# Patient Record
Sex: Female | Born: 1965 | Race: White | Hispanic: No | Smoking: Current every day smoker
Health system: Southern US, Community
[De-identification: ages and names within clinical notes are randomized; demographics above are authoritative.]

## PROBLEM LIST (undated history)

## (undated) DIAGNOSIS — I2510 Atherosclerotic heart disease of native coronary artery without angina pectoris: Secondary | ICD-10-CM

## (undated) DIAGNOSIS — F419 Anxiety disorder, unspecified: Secondary | ICD-10-CM

## (undated) DIAGNOSIS — I214 Non-ST elevation (NSTEMI) myocardial infarction: Secondary | ICD-10-CM

## (undated) DIAGNOSIS — E785 Hyperlipidemia, unspecified: Secondary | ICD-10-CM

## (undated) DIAGNOSIS — I739 Peripheral vascular disease, unspecified: Secondary | ICD-10-CM

## (undated) DIAGNOSIS — I70208 Unspecified atherosclerosis of native arteries of extremities, other extremity: Secondary | ICD-10-CM

## (undated) HISTORY — DX: Peripheral vascular disease, unspecified: I73.9

## (undated) HISTORY — DX: Non-ST elevation (NSTEMI) myocardial infarction: I21.4

## (undated) HISTORY — DX: Hyperlipidemia, unspecified: E78.5

## (undated) HISTORY — DX: Atherosclerotic heart disease of native coronary artery without angina pectoris: I25.10

## (undated) HISTORY — DX: Anxiety disorder, unspecified: F41.9

## (undated) HISTORY — PX: SP US TRANSCATHETER OCCLUSION: HXRAD92

## (undated) HISTORY — PX: PR VEIN BYPASS GRAFT,AORTO-FEM-POP: 35551

## (undated) HISTORY — DX: Unspecified atherosclerosis of native arteries of extremities, other extremity: I70.208

---

## 2006-04-16 DIAGNOSIS — I214 Non-ST elevation (NSTEMI) myocardial infarction: Secondary | ICD-10-CM

## 2006-04-16 HISTORY — DX: Non-ST elevation (NSTEMI) myocardial infarction: I21.4

## 2006-05-13 ENCOUNTER — Encounter (INDEPENDENT_AMBULATORY_CARE_PROVIDER_SITE_OTHER): Payer: Self-pay | Admitting: *Deleted

## 2006-05-13 ENCOUNTER — Ambulatory Visit: Payer: Self-pay | Admitting: Cardiology

## 2006-05-13 ENCOUNTER — Encounter: Payer: Self-pay | Admitting: Emergency Medicine

## 2006-05-14 ENCOUNTER — Encounter (INDEPENDENT_AMBULATORY_CARE_PROVIDER_SITE_OTHER): Payer: Self-pay | Admitting: *Deleted

## 2006-05-14 ENCOUNTER — Inpatient Hospital Stay (HOSPITAL_COMMUNITY): Admission: RE | Admit: 2006-05-14 | Discharge: 2006-05-16 | Payer: Self-pay | Admitting: Cardiology

## 2006-05-27 ENCOUNTER — Ambulatory Visit: Payer: Self-pay

## 2006-05-27 ENCOUNTER — Ambulatory Visit: Payer: Self-pay | Admitting: *Deleted

## 2006-05-27 ENCOUNTER — Inpatient Hospital Stay (HOSPITAL_COMMUNITY): Admission: AD | Admit: 2006-05-27 | Discharge: 2006-05-31 | Payer: Self-pay | Admitting: *Deleted

## 2006-06-03 ENCOUNTER — Ambulatory Visit: Payer: Self-pay | Admitting: Cardiovascular Disease

## 2006-06-11 ENCOUNTER — Ambulatory Visit: Payer: Self-pay | Admitting: Cardiology

## 2006-06-18 ENCOUNTER — Ambulatory Visit: Payer: Self-pay | Admitting: Cardiology

## 2006-06-25 ENCOUNTER — Ambulatory Visit: Payer: Self-pay | Admitting: Internal Medicine

## 2006-07-03 ENCOUNTER — Ambulatory Visit: Payer: Self-pay | Admitting: Internal Medicine

## 2006-07-13 ENCOUNTER — Ambulatory Visit: Payer: Self-pay | Admitting: Cardiology

## 2006-07-22 ENCOUNTER — Ambulatory Visit: Payer: Self-pay | Admitting: Cardiology

## 2006-08-06 ENCOUNTER — Ambulatory Visit: Payer: Self-pay | Admitting: Internal Medicine

## 2006-08-17 ENCOUNTER — Ambulatory Visit: Payer: Self-pay | Admitting: Cardiology

## 2006-08-18 ENCOUNTER — Ambulatory Visit: Payer: Self-pay | Admitting: Cardiovascular Disease

## 2006-08-18 ENCOUNTER — Ambulatory Visit (HOSPITAL_COMMUNITY): Admission: RE | Admit: 2006-08-18 | Discharge: 2006-08-18 | Payer: Self-pay | Admitting: Cardiology

## 2006-08-20 ENCOUNTER — Ambulatory Visit: Payer: Self-pay | Admitting: *Deleted

## 2006-10-19 ENCOUNTER — Ambulatory Visit: Payer: Self-pay | Admitting: Cardiology

## 2006-10-19 LAB — CONVERTED CEMR LAB
ALT: 13 units/L (ref 0–40)
AST: 15 units/L (ref 0–37)
Bilirubin, Direct: 0.1 mg/dL (ref 0.0–0.3)
Cholesterol: 139 mg/dL (ref 0–200)
Direct LDL: 96.7 mg/dL
HDL: 32 mg/dL — ABNORMAL LOW (ref >39.0)
LDL Cholesterol: 93 mg/dL (ref 0–99)
Total Protein: 7 g/dL (ref 6.0–8.3)
Triglycerides: 70 mg/dL (ref 0–149)

## 2006-10-29 ENCOUNTER — Ambulatory Visit: Payer: Self-pay | Admitting: *Deleted

## 2006-11-10 ENCOUNTER — Ambulatory Visit (HOSPITAL_COMMUNITY): Admission: RE | Admit: 2006-11-10 | Discharge: 2006-11-10 | Payer: Self-pay | Admitting: *Deleted

## 2006-11-10 ENCOUNTER — Ambulatory Visit: Payer: Self-pay | Admitting: *Deleted

## 2006-12-10 ENCOUNTER — Ambulatory Visit: Payer: Self-pay | Admitting: *Deleted

## 2006-12-11 ENCOUNTER — Ambulatory Visit: Payer: Self-pay | Admitting: *Deleted

## 2006-12-15 HISTORY — PX: FEMORAL BYPASS: SHX50

## 2006-12-28 ENCOUNTER — Ambulatory Visit: Payer: Self-pay | Admitting: Dentistry

## 2006-12-28 ENCOUNTER — Encounter: Payer: Self-pay | Admitting: Cardiology

## 2006-12-28 ENCOUNTER — Inpatient Hospital Stay (HOSPITAL_COMMUNITY): Admission: RE | Admit: 2006-12-28 | Discharge: 2007-01-02 | Payer: Self-pay | Admitting: *Deleted

## 2006-12-28 ENCOUNTER — Ambulatory Visit: Payer: Self-pay | Admitting: *Deleted

## 2006-12-29 ENCOUNTER — Encounter: Payer: Self-pay | Admitting: Cardiology

## 2007-01-12 ENCOUNTER — Ambulatory Visit: Payer: Self-pay | Admitting: Vascular Surgery

## 2007-01-19 ENCOUNTER — Ambulatory Visit: Payer: Self-pay | Admitting: Vascular Surgery

## 2007-01-28 ENCOUNTER — Ambulatory Visit: Payer: Self-pay | Admitting: *Deleted

## 2007-02-11 ENCOUNTER — Ambulatory Visit: Payer: Self-pay | Admitting: *Deleted

## 2007-02-19 ENCOUNTER — Ambulatory Visit: Payer: Self-pay | Admitting: *Deleted

## 2007-03-09 ENCOUNTER — Ambulatory Visit: Payer: Self-pay | Admitting: Cardiology

## 2007-03-11 ENCOUNTER — Ambulatory Visit: Payer: Self-pay | Admitting: *Deleted

## 2007-04-07 ENCOUNTER — Ambulatory Visit: Payer: Self-pay | Admitting: *Deleted

## 2007-04-08 ENCOUNTER — Ambulatory Visit: Payer: Self-pay | Admitting: *Deleted

## 2007-04-12 ENCOUNTER — Ambulatory Visit: Payer: Self-pay | Admitting: Surgery

## 2007-04-12 ENCOUNTER — Ambulatory Visit: Payer: Self-pay | Admitting: Cardiology

## 2007-04-12 ENCOUNTER — Encounter: Payer: Self-pay | Admitting: Physician Assistant

## 2007-04-14 ENCOUNTER — Encounter: Payer: Self-pay | Admitting: Cardiology

## 2007-04-14 ENCOUNTER — Ambulatory Visit: Payer: Self-pay | Admitting: Cardiology

## 2007-04-15 ENCOUNTER — Ambulatory Visit: Payer: Self-pay | Admitting: *Deleted

## 2007-04-20 ENCOUNTER — Ambulatory Visit: Payer: Self-pay | Admitting: Cardiology

## 2007-05-19 ENCOUNTER — Ambulatory Visit: Payer: Self-pay | Admitting: *Deleted

## 2007-05-19 ENCOUNTER — Ambulatory Visit (HOSPITAL_COMMUNITY): Admission: RE | Admit: 2007-05-19 | Discharge: 2007-05-19 | Payer: Self-pay | Admitting: *Deleted

## 2007-06-24 ENCOUNTER — Ambulatory Visit: Payer: Self-pay | Admitting: *Deleted

## 2007-07-16 ENCOUNTER — Ambulatory Visit: Payer: Self-pay | Admitting: Cardiology

## 2007-07-23 ENCOUNTER — Encounter: Payer: Self-pay | Admitting: Cardiology

## 2007-08-10 ENCOUNTER — Encounter: Payer: Self-pay | Admitting: Cardiology

## 2008-02-10 ENCOUNTER — Ambulatory Visit: Payer: Self-pay | Admitting: *Deleted

## 2008-02-16 ENCOUNTER — Ambulatory Visit: Payer: Self-pay | Admitting: Cardiology

## 2008-02-16 ENCOUNTER — Encounter: Payer: Self-pay | Admitting: Physician Assistant

## 2008-03-13 ENCOUNTER — Ambulatory Visit: Payer: Self-pay | Admitting: *Deleted

## 2008-05-01 ENCOUNTER — Encounter: Payer: Self-pay | Admitting: Cardiology

## 2008-05-01 ENCOUNTER — Ambulatory Visit: Payer: Self-pay | Admitting: Cardiology

## 2008-05-01 DIAGNOSIS — I7389 Other specified peripheral vascular diseases: Secondary | ICD-10-CM

## 2008-05-01 DIAGNOSIS — F17200 Nicotine dependence, unspecified, uncomplicated: Secondary | ICD-10-CM | POA: Insufficient documentation

## 2008-05-01 DIAGNOSIS — F323 Major depressive disorder, single episode, severe with psychotic features: Secondary | ICD-10-CM | POA: Insufficient documentation

## 2008-05-01 DIAGNOSIS — I2510 Atherosclerotic heart disease of native coronary artery without angina pectoris: Secondary | ICD-10-CM

## 2008-06-29 ENCOUNTER — Ambulatory Visit: Payer: Self-pay | Admitting: Cardiovascular Disease

## 2008-07-07 ENCOUNTER — Encounter: Payer: Self-pay | Admitting: Cardiology

## 2008-07-27 ENCOUNTER — Ambulatory Visit: Payer: Self-pay | Admitting: *Deleted

## 2008-10-30 ENCOUNTER — Ambulatory Visit: Payer: Self-pay | Admitting: Cardiology

## 2008-11-27 ENCOUNTER — Encounter: Payer: Self-pay | Admitting: Cardiology

## 2009-01-25 ENCOUNTER — Ambulatory Visit: Payer: Self-pay | Admitting: *Deleted

## 2009-05-11 ENCOUNTER — Telehealth (INDEPENDENT_AMBULATORY_CARE_PROVIDER_SITE_OTHER): Payer: Self-pay | Admitting: *Deleted

## 2009-05-29 ENCOUNTER — Ambulatory Visit: Payer: Self-pay | Admitting: Cardiology

## 2009-07-27 ENCOUNTER — Ambulatory Visit: Payer: Self-pay | Admitting: Vascular Surgery

## 2009-08-08 ENCOUNTER — Telehealth (INDEPENDENT_AMBULATORY_CARE_PROVIDER_SITE_OTHER): Payer: Self-pay | Admitting: *Deleted

## 2009-10-16 ENCOUNTER — Encounter: Payer: Self-pay | Admitting: Cardiology

## 2009-12-11 ENCOUNTER — Ambulatory Visit: Payer: Self-pay | Admitting: Cardiology

## 2010-02-01 ENCOUNTER — Telehealth (INDEPENDENT_AMBULATORY_CARE_PROVIDER_SITE_OTHER): Payer: Self-pay | Admitting: *Deleted

## 2010-02-21 ENCOUNTER — Encounter: Payer: Self-pay | Admitting: Cardiology

## 2010-02-21 ENCOUNTER — Ambulatory Visit: Payer: Self-pay | Admitting: Vascular Surgery

## 2010-06-06 ENCOUNTER — Encounter: Payer: Self-pay | Admitting: Cardiology

## 2010-06-06 ENCOUNTER — Ambulatory Visit: Payer: Self-pay | Admitting: Cardiology

## 2010-06-06 DIAGNOSIS — Z9861 Coronary angioplasty status: Secondary | ICD-10-CM

## 2010-06-21 ENCOUNTER — Encounter: Payer: Self-pay | Admitting: Cardiology

## 2010-07-16 NOTE — Progress Notes (Signed)
Summary: chest pain  Phone Note Call from Patient   Summary of Call: Seen in office on 6/28, was started on Norvasc and Red yeast rice.  Thought you also told her to decrease her Imdur 60mg  to 2 tabs daily instead of her 3 daily.  Waking up with night sweats, pain in shoulders and arrms x 3 weeks.  Also, c/o numbnuss and tingling in arms and hands.   Also, had chest pain.  Stated she did take 3 NTG before she got relief.   Advised her that would send note to MD, but if symptoms worsen to go to Ed for evaluation  Initial call taken by: Hoover Brunette, LPN,  February 01, 2010 10:06 AM  Follow-up for Phone Call        go back to to3 tabs of Imdur 60mg  Qdaily.  Follow-up by: Lewayne Bunting, MD, [facility],  February 03, 2010 10:25 AM  Additional Follow-up for Phone Call Additional follow up Details #1::        Patient notified.     Additional Follow-up by: Hoover Brunette, LPN,  February 04, 2010 11:32 AM

## 2010-07-16 NOTE — Assessment & Plan Note (Signed)
Summary: 6 mo fu per june reminder-srs   Visit Type:  Follow-up Primary Provider:  Gaspar Skeeters [facility] Olevia Perches)   History of Present Illness: the patient is a 45 year old female with a history of significant peripheral vascular disease status post right femoral bypass graft May of 2008. She is also status post stenting of the right external iliac artery in December 2008. She status post a non-ST elevation myocardial infarction of the distal circumflex in November of 2007 and she has significant residual RCA disease with an 80% stenosis.  The patient still complains of occasional chest pain. She also some right shoulder pain. She does take an occasional nitroglycerin but her angina pattern appears to be stable. The patient is actually very active he continues to participate in swimming. She typically states that she swims one lap then takes a rest and does another lap. she denies any shortness of breath. She denies any palpitations or syncope. She does have claudication in the lower extremities but she feels this has improved with exercise. He is currently not taking a statin and actually stopped taking herred yeast rice . Overall she is doing well however.  Preventive Screening-Counseling & Management  Alcohol-Tobacco     Smoking Status: current     Smoking Cessation Counseling: yes     Packs/Day: 1/2-1 PPD  Current Medications (verified): 1)  Adult Aspirin Low Strength 81 Mg Tbdp (Aspirin) .... One Tablet P.o. Q. Day 2)  Plavix 75 Mg Tabs (Clopidogrel Bisulfate) .... Take 1 Tablet By Mouth Once A Day 3)  Carvedilol 25 Mg Tabs (Carvedilol) .... Take 1/2 Tablet By Mouth Twice A Day 4)  Imdur 60 Mg Xr24h-Tab (Isosorbide Mononitrate) .... Take 3 Tablet By Mouth Once A Day 5)  Ranitidine Hcl 150 Mg Tabs (Ranitidine Hcl) .... Take 1 Tablet By Mouth Once A Day As Needed 6)  Nitroglycerin 0.4 Mg Subl (Nitroglycerin) .... Dissolve One Tablet Under Tongue Every 5 Minutes As Needed For Severe Chest  Pain, Not To Exceed 3 in 15 Min Time Frame 7)  Norvasc 5 Mg Tabs (Amlodipine Besylate) .... Take 1 Tablet By Mouth Once A Day 8)  Red Yeast Rice 600 Mg Caps (Red Yeast Rice Extract) .... Take 2 (Two) Tabs Daily  Allergies (verified): 1)  Lortab 2)  Percocet  Comments:  Nurse/Medical Assistant: The patient's medications and allergies were verbally reviewed with the patient and were updated in the Medication and Allergy Lists.  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 Negative adenosine stress Cardiolite ejection fraction 62% October 2008 severe peripheral vascular disease occluded left common and external iliac artery. Stenting of the right common iliac artery and a right to left femoral-femoral bypass. Right iliac stenosis putting her right-to-left femoral-femoral bypass at risk requiring stent placement December 2008  Family History: Last updated: 05/01/2008 noncontributory  Social History: Last updated: 05/01/2008 patient continues to smoke.  She has little or no financial means.  She lives alone.  Risk Factors: Smoking Status: current (12/11/2009) Packs/Day: 1/2-1 PPD (12/11/2009)  Review of Systems       The patient complains of chest pain and shortness of breath.  The patient denies fatigue, malaise, fever, weight gain/loss, vision loss, decreased hearing, hoarseness,  palpitations, prolonged cough, wheezing, sleep apnea, coughing up blood, abdominal pain, blood in stool, nausea, vomiting, diarrhea, heartburn, incontinence, blood in urine,  muscle weakness, joint pain, leg swelling, rash, skin lesions, headache, fainting, dizziness, depression, anxiety, enlarged lymph nodes, easy bruising or bleeding, and environmental allergies.    Vital Signs:  Patient profile:   45 year old female Height:      62 inches Weight:      137 pounds BMI:     25.15 Pulse rate:   64 / minute BP sitting:   107 / 71  (left arm) Cuff size:   regular  Vitals Entered By: Carlye Grippe (December 11, 2009 8:53 AM)  Nutrition Counseling: Patient's BMI is greater than 25 and therefore counseled on weight management options.  Physical Exam  Additional Exam:  General: Well-developed, well-nourished in no distress head: Normocephalic and atraumatic eyes PERRLA/EOMI intact, conjunctiva and lids normal nose: No deformity or lesions mouth normal dentition, normal posterior pharynx neck: Supple, no JVD.  No masses, thyromegaly or abnormal cervical nodes lungs: Normal breath sounds bilaterally without wheezing.  Normal percussion heart: regular rate and rhythm with normal S1 and S2, no S3 or S4.  PMI is normal.  No pathological murmurs abdomen: Normal bowel sounds, abdomen is soft and nontender without masses, organomegaly or hernias noted.  No hepatosplenomegaly musculoskeletal: Back normal, normal gait muscle strength and tone normal pulsus: diminished pulses in dorsalis pedis and posterior tibial pulses bilaterally. Extremities: No peripheral pitting edema neurologic: Alert and oriented x 3 skin: Intact without lesions or rashes cervical nodes: No significant adenopathy psychologic: Normal affect    Impression & Recommendations:  Problem # 1:  * STATIN INTOLERANCE the patient is able to tolerate red yeast rice and have asked her to restart this.  Problem # 2:  TOBACCO ABUSE (ICD-305.1) the patient unfortunately continues to smoke I counseled her to get about this  Problem # 3:  CORONARY ARTERY DISEASE, S/P PTCA (ICD-414.9) the patient is at  chronic stable angina and uses nitroglycerin in a stable pattern. I added amlodipine for better chest pain control. Her updated medication list for this problem includes:    Adult Aspirin Low Strength 81 Mg Tbdp (Aspirin) ..... One tablet p.o. q. day    Plavix 75 Mg Tabs (Clopidogrel bisulfate) .Marland Kitchen... Take 1 tablet by mouth once a day    Carvedilol 25 Mg Tabs (Carvedilol) .Marland Kitchen... Take 1/2 tablet by mouth twice a day    Imdur 60 Mg Xr24h-tab (Isosorbide mononitrate) .Marland Kitchen... Take 3 tablet by mouth once a day    Nitroglycerin 0.4 Mg Subl (Nitroglycerin) .Marland Kitchen... Dissolve one tablet under tongue every 5 minutes as needed for severe chest pain, not to exceed 3 in 15 min time frame    Norvasc 5 Mg Tabs (Amlodipine besylate) .Marland Kitchen... 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 Her claudication pattern however stable.  Patient Instructions: 1)  Norvasc 5mg  daily 2)  Red yeast rice daily - buy OTC 3)  Follow up in  6 months Prescriptions: NITROGLYCERIN 0.4 MG SUBL (NITROGLYCERIN) dissolve one tablet under tongue every 5 minutes as needed for severe chest pain, not to exceed 3 in 15 min time frame  #25 x 2   Entered by:   Hoover Brunette, LPN   Authorized by:   Lewayne Bunting, MD, [facility]   Signed by:   Hoover Brunette, LPN on 81/19/1478  Method used:   Electronically to        [facility] # [phone]* (retail)       [address]       [place], [place]  [address]       Ph: [phone]       Fax: [phone]   RxID:   [id number] NORVASC 5 MG TABS (AMLODIPINE BESYLATE) Take 1 tablet by mouth once a day  #30 x 6   Entered by:   Hoover Brunette, LPN   Authorized by:   Lewayne Bunting, MD, [facility]   Signed by:   Hoover Brunette, LPN on 27/25/3664   Method used:   Electronically to        [facility] # [phone]* (retail)       [address]       [place], [place]  [address]       Ph: [phone]       Fax: [phone]    RxID:   [id number]   Handout requested.  I have personnaly reviewed all medications changes and approved new prescriptions and refills. Lewayne Bunting, MD, [facility]  December 11, 2009 9:40 AM

## 2010-07-16 NOTE — Letter (Signed)
Summary: External Correspondence/ OFFICE VISIT [facility]  External Correspondence/ OFFICE VISIT [facility]   Imported By: Dorise Hiss 10/22/2009 14:48:13  _____________________________________________________________________  External Attachment:    Type:   Image     Comment:   External Document

## 2010-07-16 NOTE — Progress Notes (Signed)
Summary: PLAVIX REORDER     Phone Note Call from Patient Call back at Home Phone [phone]   Caller: Patient Call For: [employer] Summary of Call: need plavix reorder. Initial call taken by: Carlye Grippe,  August 08, 2009 8:10 AM  Follow-up for Phone Call        Voice mail box has not been set up yet. Hoover Brunette, LPN  August 10, 2009 12:47 PM  No answer.  Hoover Brunette, LPN  August 13, 2009 1:36 PM  Patient notified.    Follow-up by: Hoover Brunette, LPN,  August 14, 2009 10:04 AM

## 2010-07-16 NOTE — Letter (Signed)
Summary: [facility] Visit   [facility] Visit   Imported By: Roderic Ovens 04/01/2010 17:18:09  _____________________________________________________________________  External Attachment:    Type:   Image     Comment:   External Document

## 2010-07-18 NOTE — Assessment & Plan Note (Signed)
Summary: 6 MO FU PER DEC REMINDER   Visit Type:  Follow-up Primary Provider:  Gaspar Skeeters [facility] Olevia Perches)   History of Present Illness: the patient is a 45-year-old female with a history of significant peripheral vascular disease status post right femoral bypass graft May of 2008. She is also status post stenting of the right external iliac artery in December 2008. She status post a non-ST elevation myocardial infarction of the distal circumflex in November of 2007 and she has significant residual RCA disease with an 80% stenosis. the patient reports that she used sublingual nitroglycerin on a couple of occasions.  She continues to be unable to tolerate Lipitor.  She has cut back on her smoking.  She now smokes half a pack a day.  She's been doing well.  However she has done well having ischemia testing in quite some time.  She also complains of symptoms of reflux.  She denies any palpitations or syncope.  Her EKG today is relatively unremarkable.  The patient reports some symptoms of reflux I recommended that she takes Zantac  Preventive Screening-Counseling & Management  Alcohol-Tobacco     Smoking Status: current     Smoking Cessation Counseling: yes     Packs/Day: 1/2 PPD  Current Medications (verified): 1)  Adult Aspirin Low Strength 81 Mg Tbdp (Aspirin) .... One Tablet P.o. Q. Day 2)  Plavix 75 Mg Tabs (Clopidogrel Bisulfate) .... Take 1 Tablet By Mouth Once A Day 3)  Carvedilol 25 Mg Tabs (Carvedilol) .... Take 1/2 Tablet By Mouth Twice A Day 4)  Imdur 60 Mg Xr24h-Tab (Isosorbide Mononitrate) .... Take 3 Tablet By Mouth Once A Day 5)  Ranitidine Hcl 150 Mg Tabs (Ranitidine Hcl) .... Take 1 Tablet By Mouth Once A Day As Needed 6)  Nitroglycerin 0.4 Mg Subl (Nitroglycerin) .... Dissolve One Tablet Under Tongue Every 5 Minutes As Needed For Severe Chest Pain, Not To Exceed 3 in 15 Min Time Frame 7)  Red Yeast Rice 600 Mg Caps (Red Yeast Rice Extract) .... Take 2 (Two) Tabs  Daily  Allergies (verified): 1)  Lortab 2)  Percocet  Comments:  Nurse/Medical Assistant: The patient's medication list and allergies were reviewed with the patient and were updated in the Medication and Allergy Lists.  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 Negative adenosine stress Cardiolite ejection fraction 62% October 2008 severe peripheral vascular disease occluded left common and external iliac artery. Stenting of the right common iliac artery and a right to left femoral-femoral bypass. Right iliac stenosis putting her right-to-left femoral-femoral bypass at risk requiring stent placement December 2008  Past Surgical History: status-post right common iliac artery stent and right external iliac artery angioplasty in May 2008. Right to left fem-fem bypass in July 2008 followed by a right external iliac artery stent placed in December of 2008. Occlusion off her fem-fem bypass with claudication in both legs. ABIs September 2011 .87 on the right and .58 on the left done by vascular surgery.  as of September 2011 it was felt that the patient could be managed medically without re-intervention.  Family History: noncontributory Negative FH of Diabetes, Hypertension, or Coronary Artery Disease  Social History: Packs/Day:  1/2 PPD  Review of Systems       The patient complains of chest pain and muscle weakness.  The patient denies fatigue, malaise, fever, weight gain/loss, vision loss, decreased hearing, hoarseness, palpitations, shortness of  breath, prolonged cough, wheezing, sleep apnea, coughing up blood, abdominal pain, blood in stool, nausea, vomiting, diarrhea, heartburn, incontinence, blood in urine, joint pain, leg swelling, rash, skin lesions, headache, fainting, dizziness, depression, anxiety, enlarged lymph nodes, easy bruising or bleeding, and environmental allergies.         claudication stable  Vital Signs:  Patient profile:   45 year old female Height:      62 inches Weight:      139 pounds Pulse rate:   67 / minute BP sitting:   104 / 69  (left arm) Cuff size:   regular  Vitals Entered By: Carlye Grippe (June 06, 2010 2:16 PM)  Physical Exam  Additional Exam:  General: Well-developed, well-nourished in no distress head: Normocephalic and atraumatic eyes PERRLA/EOMI intact, conjunctiva and lids normal nose: No deformity or lesions mouth normal dentition, normal posterior pharynx neck: Supple, no JVD.  No masses, thyromegaly or abnormal cervical nodes lungs: Normal breath sounds bilaterally without wheezing.  Normal percussion heart: regular rate and rhythm with normal S1 and S2, no S3 or S4.  PMI is normal.  No pathological murmurs abdomen: Normal bowel sounds, abdomen is soft and nontender without masses, organomegaly or hernias noted.  No hepatosplenomegaly musculoskeletal: Back normal, normal gait muscle strength and tone normal pulsus: diminished pulses in dorsalis pedis and posterior tibial pulses bilaterally. Extremities: No peripheral pitting edema neurologic: Alert and oriented x 3 skin: Intact without lesions or rashes cervical nodes: No significant adenopathy psychologic: Normal affect    EKG  Procedure date:  06/06/2010  Findings:      normal sinus rhythm. left ventricular hypertrophy no acute ischemic changes  Impression & Recommendations:  Problem # 1:  * STATIN INTOLERANCE will check lipid panel and LFTs. Orders: T-Hepatic Function [id number])  Problem # 2:  TOBACCO  ABUSE (ICD-305.1) the patient was again counseled regarding tobacco use she apparently has cut down.  Problem # 3:  CORONARY ARTERY DISEASE, S/P PTCA (ICD-414.9) the patient reports the use of occasional nitroglycerin.  She has not had an ischemia evaluation quite some time.  We will proceed with a Lexiscan. The following medications were removed from the medication list:    Norvasc 5 Mg Tabs (Amlodipine besylate) .Marland Kitchen... Take 1 tablet by mouth once a day Her updated medication list for this problem includes:    Adult Aspirin Low Strength 81 Mg Tbdp (Aspirin) ..... One tablet p.o. q. day    Plavix 75 Mg Tabs (Clopidogrel bisulfate) .Marland Kitchen... Take 1 tablet by mouth once a day    Carvedilol 25 Mg Tabs (Carvedilol) .Marland Kitchen... Take 1/2 tablet by mouth twice a day    Imdur 60 Mg Xr24h-tab (Isosorbide mononitrate) .Marland Kitchen... Take 3 tablet by mouth once a day    Nitroglycerin 0.4 Mg Subl (Nitroglycerin) .Marland Kitchen... 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  It was felt at the right external iliac artery and common iliac artery stents were patent with some increased velocity across the right common femoral artery.  It was felt that the patient  is treated medically with follow-up ABIs in 6 months  Patient Instructions: 1)  Ranitidine 150mg  daily 2)  Lexiscan stress test  3)  Labs:  FLP, LFT  4)  Above can be done after the new year  5)  Follow up in  6 months Prescriptions: RANITIDINE HCL 150 MG TABS (RANITIDINE HCL) Take 1 tablet by mouth once a day as needed  #30 x 6   Entered by:   Hoover Brunette, LPN   Authorized by:   Lewayne Bunting, MD, [facility]   Signed by:   Hoover Brunette, LPN on 41/32/4401   Method used:   Electronically to        [facility] (retail)       [address]       [place], [place]  [address]       Ph: [phone]       Fax: [phone]   RxID:   [id number]

## 2010-10-10 ENCOUNTER — Other Ambulatory Visit: Payer: Self-pay | Admitting: *Deleted

## 2010-10-10 MED ORDER — ISOSORBIDE MONONITRATE ER 60 MG PO TB24: 180.0000 mg | ORAL_TABLET | ORAL | Status: DC

## 2010-10-29 NOTE — Procedures (Signed)
BYPASS GRAFT EVALUATION   INDICATION:  Followup left to right fem-fem bypass graft and right  external iliac PTA stent.   HISTORY:  Diabetes:  No.  Cardiac:  Yes.  Hypertension:  Yes.  Smoking:  Yes.  Previous Surgery:  Please see above.   SINGLE LEVEL ARTERIAL EXAM                               RIGHT              LEFT  Brachial:                    134                139  Anterior tibial:             124                68  Posterior tibial:            124                73  Peroneal:  Ankle/brachial index:        0.89               0.53   PREVIOUS ABI:  Date:  04/12/2007  RIGHT:  0.62  LEFT:  0.58   LOWER EXTREMITY BYPASS GRAFT DUPLEX EXAM:   DUPLEX:  Occluded right to left fem-fem bypass graft with high-grade  stenosis noted at the outflow and inflow site.  High-grade stenosis also  noted at the right external iliac.   IMPRESSION:  1. Occluded right to left fem-fem bypass graft with high-grade      stenosis at bilateral anastomotic site and right external iliac.  2. Mildly abnormal ABI with monophasic Doppler waveform noted in the      right leg.  3. Moderately abnormal ABI with monophasic Doppler waveform noted in      the left leg.  Status post right to left fem-fem bypass graft and      right external iliac PTA and stenting.     ___________________________________________  P. Liliane Bade, M.D.   MG/MEDQ  D:  02/10/2008  T:  02/10/2008  Job:  [id number]

## 2010-10-29 NOTE — Procedures (Signed)
LOWER EXTREMITY ARTERIAL EVALUATION-SINGLE LEVEL   INDICATION:  Followup peripheral vascular disease.   HISTORY:  Diabetes:  No  Cardiac:  MI  Hypertension:  No  Smoking:  Quit in 2007  Previous Surgery:  Nov 09, 2005 right common iliac artery and external  iliac artery PTA and stent by Dr. Madilyn Fireman.  December 28, 2006 right to left femoral to femoral bypass graft by Dr.  Madilyn Fireman.   RESTING SYSTOLIC PRESSURES: (ABI)                          RIGHT                LEFT  Brachial:               134                  140  Anterior tibial:        100                  108  Posterior tibial:       120 (0.86)           110 (0.79)  Peroneal:  DOPPLER WAVEFORM ANALYSIS:  Anterior tibial:        Monophasic           Monophasic  Posterior tibial:       Biphasic             Monophasic  Peroneal:   PREVIOUS ABI'S:  Date: 10/29/2006  RIGHT:  0.69  LEFT:  0.46   IMPRESSION:  Ankle brachial indices are improved bilaterally from  preoperative study of Oct 29, 2006.   ___________________________________________  P. Liliane Bade, M.D.   DP/MEDQ  D:  01/28/2007  T:  01/29/2007  Job:  [id number]

## 2010-10-29 NOTE — Assessment & Plan Note (Signed)
OFFICE VISIT   Kelsey Hansen, Kelsey Hansen  DOB:  April 15, 1966                                       07/27/2008  CHART#:[id number]   The patient is seen in followup of her chronic peripheral vascular  disease.  She has a history of right common and external iliac artery  stenting along with a right to left femoral-femoral bypass.  Known  occlusion of the right to left femoral-femoral bypass, felt to be due to  inflow stenosis.  She is poorly compliant.  Has a history of coronary  artery disease.  She is using tobacco products.  Not taking any  anticholesterol medications at this time.   MEDICATIONS:  1. Her medications include ranitidine 150 mg q.h.s.  2. Carvedilol 12.5 mg b.i.d.  3. Isosorbide 60 mg t.i.d.  4. Fish oil 1200 mg b.i.d.  5. Nitroglycerin 0.4 mg p.r.n.  6. Plavix 75 mg daily.  7. Allergy medication once daily.  8. Bayer aspirin 81 mg daily.  9. Iron 65 mg every other day.   ALLERGIES:  No known allergies.   PHYSICAL EXAMINATION:  The patient appears generally well.  No acute  distress.  BP 109/72, pulse is 73 per minute.  Her lower extremity exam  reveals a weak 1+ right femoral pulse.  Absent left femoral pulse.  No  palpable distal pulses in the lower extremities.   Ankle brachial indices are relatively well maintained at 0.93 in the  right and 0.67 on the left.   The patient has longstanding peripheral vascular disease with poor  medical compliance related to her vascular disease in general.  No  evidence of acute deterioration.  Symptoms remain stable.  Plan followup  in 6 months with a repeat ABI.  The patient encouraged again to  discontinue her smoking habit and be more compliant with medical  regimen.   Balinda Quails, M.D.  Electronically Signed   PGH/MEDQ  D:  07/27/2008  T:  07/28/2008  Job:  [id number]   cc:   Learta Codding, MD,FACC

## 2010-10-29 NOTE — Progress Notes (Signed)
[facility]                        PERIPHERAL VASCULAR OFFICE NOTE   Kelsey Hansen, Kelsey Hansen                        MRN:          [id number]  DATE:06/29/2008                            DOB:          February 24, 1966    REASON FOR VISIT:  Lower extremity PAD.   HISTORY OF PRESENT ILLNESS:  [name] is a 45 year old woman with an  extensive vascular history presenting for evaluation.  She had been  followed by Dr. Andee Lineman for her cardiac problems and she is followed by  Dr. Madilyn Fireman for her vascular disease.  She has extensive premature CAD and  vascular disease.  She has had a non-ST-elevation MI and has undergone  angioplasty of the distal circumflex artery.  Her access site was the  right brachial artery and her procedure was complicated by brachial  artery occlusion post catheterization.  She had surgical thrombectomy.  She has undergone right to left FEM-FEM bypass as well as stenting of  the right common iliac artery.  Her bypass graft has occluded and she  has restenosis of the iliac stent.  She continues to smoke cigarettes.   From a symptomatic standpoint, she describes bilateral leg pain with  ambulation.  However, she is able to walk for 15 minutes without  stopping.  She takes frequent nitroglycerin and has some degree of  cardiac limitation.  She has not had much change in her leg pain over  the past year.  She was seen by Dr. Madilyn Fireman in August and he elected to  manage her conservatively because of the stability of her symptoms.   MEDICATIONS:  Isosorbide 60 mg t.i.d., ranitidine 150 mg bedtime, fish  oil 1 g two daily, iron 65 mg twice daily, allergy medication daily,  Plavix 75 mg daily, aspirin 81 mg daily, and carvedilol 12.5 mg daily.   ALLERGIES:  PERCOCET, LORTAB, CITALOPRAM, and SIMVASTATIN.   PHYSICAL EXAMINATION:  GENERAL:  The patient is alert and oriented.  No  acute distress.  VITAL SIGNS:  Weight is 155 pounds, blood pressure 110/66,  heart rate  72.  LUNGS:  Clear.  HEART:  Regular rate and rhythm.  ABDOMEN:  Soft, nontender, no bruits.  EXTREMITIES:  There is a right femoral bruit.  Femoral pulses are not  palpable.  Dorsalis pedis and posterior tibial pulses are not palpable.  SKIN:  Warm and dry.  There is no rash.  There are no ischemic  ulcerations.   ASSESSMENT:  This is a 46 year old woman with extensive lower extremity  arterial disease.  She has intermittent claudication without evidence of  critical limb ischemia.  She is scheduled to  follow up with Dr. Madilyn Fireman in February for her routine vascular followup.  I did not recommend any changes in her medical program.  Continued  followup per Dr. Madilyn Fireman.     Kelsey Hansen. Kelsey Seltzer, MD  Electronically Signed    MDC/MedQ  DD: 06/29/2008  DT: 06/30/2008  Job #: [id number]   cc:   Learta Codding, MD,FACC  P. Liliane Bade, M.D.

## 2010-10-29 NOTE — Assessment & Plan Note (Signed)
[facility]                          [facility] OFFICE NOTE   AMBRIEL, GORELICK                        MRN:          [id number]  DATE:02/16/2008                            DOB:          [birth year]    REASON FOR VISIT:  Scheduled followup.   [name] returns to our clinic, since last seen here in January of this  year, by Dr. Andee Lineman.  At that time, she had undergone recent  revascularization of her right lower extremity, by Dr. Liliane Bade, with  stenting of the right external iliac artery.  She had previously  undergone right/left fem-fem bypass grafting in July 2008.  This  apparently remained patent.   From a cardiac standpoint, [name] does report some recent episodes of  chest pain, for which she took nitroglycerin with subsequent relief.  However, these episodes occurred at rest and were not similar to her MI  presentation.  Moreover, she denies any strict correlation of anterior  chest discomfort with exertion.   Unfortunately, [name] has resumed smoking.  She also stopped taking  simvastatin earlier this year, citing significant joint pain.  She  reports having been previously intolerant to Lipitor, as well.  It is  not clear she had been tried on any other statin.   EKG in our office today reveals NSR at 63 bpm with normal axis and no  significant change since her previous study.   CURRENT MEDICATIONS:  1. Carvedilol 12.5 b.i.d.  2. Isosorbide mononitrate 90 daily.  3. Plavix.  4. Aspirin 81 daily.  5. Iron 65 mg OTC every other day.   PHYSICAL EXAMINATION:  VITAL SIGNS:  Blood pressure 110/70, pulse 74,  regular, and weight 156.  GENERAL:  A 45 year old female, sitting upright, in no distress.  HEENT:  Normal.  NECK:  Palpable carotid pulses; faint left bruits; no JVD.  LUNGS:  Clear to auscultation in all fields.  HEART:  Regular rate and rhythm.  No significant murmurs.  No rubs.  ABDOMEN:  Soft and nontender.  EXTREMITIES:  No edema.  NEURO:  No focal deficit.   IMPRESSION:  1. Multivessel CAD.      a.     Status post NSTEMI/PTCA of distal CFX, November 2007.      b.     Residual 80%, distal RCA; 50% first diagonal; and, diffuse       ramus intermedius disease, treated medically.      c.     EF 55%; posterior basilar akinesis.      d.     Post catheterization complication of brachial artery       occlusion, requiring surgical repair, and subsequent right upper       extremity thrombosis.      e.     Negative adenosine stress Cardiolite; EF 62%, October 2008.  2. Severe peripheral vascular disease.      a.     Status post right fem-fem bypass graft, July 2008.      b.     Status post stenting of right EIA, December 2008.  3.  Ongoing tobacco.  4. History of sinus bradycardia, on Coreg.  5. Dyslipidemia.      a.     STATIN intolerant.   PLAN:  1. Adjust medication regimen as follows:  Uptitrate Imdur to 120 mg      daily, to be taken all at one-time in the morning.  Apparently, Ms.      Kelsey Hansen has been taking Imdur in divided doses in the evening.  This      consolidation of her medication, as well as the increased dose,      will hopefully prevent her from having possible breakthrough      angina.  We will also start her on fish oil 1 capsule twice daily      for management of her dyslipidemia, given that she is intolerant to      statins.  She will need followup fasting lipid/liver profile in 12      weeks.  of note, the patient has been previously deemed as needing      to remain on Plavix, indefinitely  2. Renew prescription for p.r.n. nitroglycerin.  3. Schedule early clinic followup with myself and Dr. Andee Lineman in 2      months.      Kelsey Serpe, PA-C  Electronically Signed      Kelsey Codding, MD,FACC  Electronically Signed   GS/MedQ  DD: 02/16/2008  DT: 02/17/2008  Job #: [id number]   cc:   Kelsey Hansen, M.D.

## 2010-10-29 NOTE — Assessment & Plan Note (Signed)
OFFICE VISIT   Kelsey Hansen, Kelsey Hansen  DOB:  1965-10-12                                       12/10/2006  CHART#:[id number]   The patient is status post lower extremity arteriography, right common  iliac and external iliac PTA and stenting of right common iliac artery.  Attempt was made to recannulize chronic occlusion of the left common and  external iliac arteries unsuccessfully.  This was carried out Nov 10, 2006.   She presents today for initial post-intervention visit.  Pain in her  right leg has improved considerably.  This is 2+ right femoral pulse.  Absent left femoral pulse.  Blood pressure is 140/86, pulse 75 per  minute, regular respirations 16 per minute.   The patient will require a right to left femoral-femoral bypass for  relief of left lower extremity claudication.  Unsuccessful attempt at  recannulization of the left common and external iliac occlusion.  This  is scheduled for December 28, 2006 at [facility].  She will stop  her Plavix one week prior to the procedure.  Continue aspirin as  currently taken.   Balinda Quails, M.D.  Electronically Signed   PGH/MEDQ  D:  12/10/2006  T:  12/11/2006  Job:  [id number]

## 2010-10-29 NOTE — Assessment & Plan Note (Signed)
OFFICE VISIT   Kelsey Hansen, Kelsey Hansen  DOB:  10/22/1965                                       02/10/2008  CHART#:[id number]   The patient is a 45 year old female vasculopath with known coronary  artery disease and aortoiliac disease.  She has undergone previous  coronary intervention and had right iliac stenting with right-to-left  femoral-femoral bypass.   She presented at this time for routine postoperative surveillance of her  vascular disease.  This unfortunately reveals an occlusion of her right-  to-left femoral-femoral graft.  She has evidence of a recurrent right  iliac stenosis with elevated velocities over 400 cm/sec in the external  iliac artery which likely resulted in occlusion of her fem-fem graft.  This is the inflow vessel.   The patient's symptoms, however, have not changed significantly.  She  does note some burning and discomfort with ambulation, although she is  walking up two a mile daily.  Unfortunately, she is quite noncompliant  with her medical regimen, has discontinued her cholesterol medication  and is smoking again.   PHYSICAL EXAMINATION:  General:  On evaluation she is a 45 year old  female who appears approximately her stated age.  Mild obesity.  Vital  signs:  BP 130/80, pulse 72 per minute.  Abdomen:  Her abdomen is benign  without tenderness, masses or organomegaly.  Extremities:  Her lower  extremities reveal 1+ right femoral, absent left femoral pulse.  No  popliteal, posterior tibial or dorsalis pedis pulses.  Her feet are  adequately perfused.   Ankle brachial indices measure 0.89 on the right and 0.53 on the left.   The patient has had occlusion of right-to-left femoral-femoral bypass  likely based on recurrent stenosis of the right external iliac artery  with depressed inflow into the bypass graft.  She seems to be tolerating  symptoms adequately at present.  I do not think recurrent intervention  is indicated at this  time, she clearly has very poor compliance as  indicated by her recurrent smoking and discontinuation of her  cholesterol medication.   She is due to see Dr. Andee Lineman next month and I will plan followup with  her again in 6 months.   Balinda Quails, M.D.  Electronically Signed   PGH/MEDQ  D:  02/10/2008  T:  02/11/2008  Job:  [id number]   cc:   Learta Codding, MD,FACC

## 2010-10-29 NOTE — Op Note (Signed)
NAME:  Kelsey Hansen, Kelsey Hansen NO.:  [id number]   MEDICAL RECORD NO.:  [id number]          PATIENT TYPE:  INP   LOCATION:  2031                         FACILITY:  [facility]   PHYSICIAN:  Charlynne Pander, D.D.S.DATE OF BIRTH:  Mar 20, 1966   DATE OF PROCEDURE:  12/31/2006  DATE OF DISCHARGE:                               OPERATIVE REPORT   PREOPERATIVE DIAGNOSES:  1. Peripheral vascular disease.  2. Chronic apical periodontitis.  3. Chronic periodontitis.  4. Significantly mobile teeth.  5. Exostoses.   POSTOPERATIVE DIAGNOSES:  1. Peripheral vascular disease.  2. Chronic apical periodontitis.  3. Chronic periodontitis.  4. Significantly mobile teeth.  5. Exostoses.   OPERATION:  1. Extraction of remaining teeth (teeth 3, 6, 11, and 22).  2. Four quadrants alveoloplasty.  3. Bilateral mandibular lingual tori reductions.   SURGEON:  Charlynne Pander, D.D.S.   ASSISTANT:  1. Elliot Dally (Sales executive).  2. Thane Edu (dental student).   ANESTHESIA:  Monitored anesthesia care per the anesthesia team.   MEDICATIONS:  1. Ancef 1 gram IV prior to invasive dental procedures.  2. Local anesthesia with total utilization of 6 Carpules each      containing 34 mg of Xylocaine with 0.017 mg of epinephrine as well      as 1 Carpule containing 9 mg of bupivacaine with 0.009 mg of      epinephrine.   SPECIMENS:  There were five teeth which were discarded.   CULTURES:  None.   DRAINS:  None.   COMPLICATIONS:  None.   ESTIMATED BLOOD LOSS:  100 mL.   FLUIDS:  About 1,100 mL of lactated Ringer solution.   INDICATION:  The patient had a history of peripheral vascular disease  and underwent a surgical procedure with Dr. Madilyn Fireman.  During the  intubation procedure, a tooth was dislodged and subsequently found to be  in the stomach.  Dental consultation requested a right upper dentition  to rule out aspiration risk and to provide dental treatment prior to  potential systemic infection.  The patient was examined and treatment  planned for extraction of all remaining teeth with alveoloplasty and  preprosthetic surgery was indicated.  This treatment plan again was  formulated to decrease the risks and complications associated with  dental infection from affecting the patient's systemic health as well as  to prevent the future aspiration risks.   OPERATIVE FINDINGS:  The patient was examined in operating room #2.  Teeth were identified for extraction.  The patient was noted to be  effected by chronic periodontitis, chronic apical periodontitis, and  multiple mobile teeth.  The aforementioned necessity to removal of all  remaining teeth along with alveoloplasty and preprosthetic surgery was  indicated.   DESCRIPTION OF PROCEDURE:  The patient was brought to the main operating  room #2.  The patient was then placed in a supine position on the  operating room table.  Monitored anesthesia care was induced per the  anesthesia team.  The patient was then prepped and draped in the usual  manner for dental medicine procedure.  The  oral cavity was thoroughly  examined with the findings noted above.  The patient was then ready for  the dental medicine procedure as follows:   Local anesthesia was administered with a total utilization of 6 Carpules  each containing 34 mg of Xylocaine with 0.017 mg of epinephrine as well  as 1 Carpule containing 9 mg of bupivacaine with 0.009 mg of  epinephrine.   The maxillary quadrants were first approached.  The anesthesia was  delivered with Xylocaine with epinephrine.  Further infiltration was  achieved and the mandibular right and mandibular left quadrants  utilizing the remaining Xylocaine with epinephrine as well as 1 Carpule  of the bupivacaine with epinephrine. The maxillary left quadrant was  first approached.  Tooth 14 was removed with the 53 forceps without  complications.  Teeth 6 and 11 were then removed  with a 150 forceps  without complications.  Tooth 3 was then removed with the 53R forceps  without complications.  Tooth #22 was then removed with a 151 forceps  without complications.  At this point in time, the maxillary left  quadrant was approached.  The 15 blade incision was made from distal 16  and extended to mesial 8.  Surgical flaps were then carefully reflected.  Alveoloplasty was then performed utilizing the rongeurs and bone flap.  The surgical site was then irrigated with copious amounts of sterile  saline.  The tissues were approximated and trimmed appropriately.  The  surgical site was then closed in the maxillary left tuberosity and  extended to mesial 9 utilizing 3-0 chromic gut suture in a continuous  interrupted suture technique x1.   The maxillary right quadrant was then approached.  The 15 blade incision  was made from the distal 1 and extended through the mesial 8.  Surgical  flap was then carefully reflected.  Appropriate amounts of buccal and  interseptal bone and alveoloplasty was performed utilizing the rongeurs  and bone file.  The tissues were approximated and trimmed appropriately.  The surgical site was then irrigated with copious amounts of sterile  saline.  The surgical site was then closed from the distal 2 and  extended to the mesial 8 utilizing 2-0 chromic gut suture in a  continuous interrupted suture technique x1.   The mandibular quadrants were then approached.  A 15 blade incision was  then made from the distal 18 and extended to the mesial 30.  A surgical  flap was then carefully reflected.  Alveoloplasty was then performed  utilizing rongeurs and bone file.  At this point in time, the mandibular  left and mandibular right tori were visualized and removed with a  surgical handpiece and bur and copious amounts of sterile saline.  Further alveoloplasty was performed utilizing rongeurs and bone file as  indicated.  The surgical sites were then  irrigated with copious amounts  of sterile saline x2.  The tissues were approximated and trimmed  appropriately.  The surgical site was then closed from the area of 18  and extended to the mesial of 24 utilizing 3-0 chromic gut suture in a  continuous interrupted suture technique x1.  The mandibular right  quadrant was then closed in the distal 30 and extended to the mesial 25  utilizing 3-0 chromic gut suture in a continuous interrupted suture  technique x1.   At this point in time, the entire mouth was irrigated with copious  amounts of sterile saline.  The patient was examined for complications,  seeing none, dental  medicine procedure was deemed to be complete.  A  series of 4 x 4 gauze were placed in the mouth to aid hemostasis.  The  patient was then handed over to the anesthesia team for final  disposition.  After the appropriate amount of time, the patient was  taken to the postanesthesia care unit with stable vital signs with a  good oxygenation level.  All counts were correct from the onset of the  procedure.  The patient will be seen in approximately 1 week for  evaluation and for suture removal as indicated.  The patient will then  follow up with the dentist of her choice for fabrication of upper and  lower complete dentures.      Charlynne Pander, D.D.S.  Electronically Signed     RFK/MEDQ  D:  12/31/2006  T:  12/31/2006  Job:  [id number]   cc:   Sherlean Foot, MD  Burna Forts, M.D.

## 2010-10-29 NOTE — Op Note (Signed)
NAME:  Kelsey Hansen, Kelsey Hansen                 ACCOUNT NO.:  [id number]   MEDICAL RECORD NO.:  [id number]          PATIENT TYPE:  AMB   LOCATION:  SDS                          FACILITY:  [facility]   PHYSICIAN:  Balinda Quails, M.D.    DATE OF BIRTH:  [birth year]   DATE OF PROCEDURE:  05/19/2007  DATE OF DISCHARGE:                               OPERATIVE REPORT   DIAGNOSIS:  1. Right iliac artery stenosis.   PROCEDURES:  1. Ultrasound-guided right common femoral artery access.  2. Retrograde right iliac arteriogram.  3. Bilateral lower extremity runoff arteriography.  4. Right external iliac PTA/stent.   CLINICAL NOTE:  Kelsey Hansen is a 45 year old female with advanced  atherosclerotic vascular disease.  She has an occluded left common and  external iliac artery.  She has had previous stenting of the right  common iliac artery, and a right-to-left femoral-femoral bypass.  During  follow-up surveillance, she was noted to have a right iliac stenosis  placing her right-to-left femoral-femoral bypass at risk.  She is  brought to the catheterization lab at this time for a diagnostic workup  with arteriography and possible intervention.   PROCEDURE NOTE:  The patient was brought to the catheterization lab in  stable condition.  Placed in a supine position.  Both groins were  prepped and draped in a sterile fashion.  The patient did receive 2 mg  of Versed, 75 mcg of fentanyl intravenously during the procedure.   Skin and subcutaneous tissue in the right groin were instilled with 1%  Xylocaine.  Ultrasound of the right groin was carried out.  The right  femoral anastomosis of the fem-fem graft was identified.  This was then  coursed anteriorly or superiorly to identify the right common femoral  artery just proximal to the graft.  This was then accessed with an 18-  gauge needle.  A 0.035 Wholey guidewire was advanced through the needle  into the midabdominal aorta.  A 6-French dilator was passed over  the  guidewire.  The Wholey guidewire was then removed, and an Amplatz  superstiff guidewire was advanced through the dilator into the abdominal  aorta.  The dilator was then removed, and a 6-French sheath advanced  over the guidewire.  The dilator removed and sheath flushed with  heparin/saline solution.   A retrograde right femoral arteriogram was obtained.  This revealed a  patent right common iliac artery stent.  Patent right hypogastric  artery.  There was a stenosis just beyond the origin of the right  external iliac artery, estimated to be 60%-70%.  There was some  narrowing of the right external iliac artery down to the common femoral  artery.  This appeared to be a long segment of plaque.  However, this  was not significantly stenosed beyond the origin.   The patient was administered a total of 5000 units of heparin  intravenously.  A 7 x 40 Smart stent was then advanced over the  guidewire and deployed in the right external iliac artery just beyond  the takeoff of the right hypogastric artery.  This was  then postdilated  with a 6 x 30 Powerflex balloon at 10 atmospheres for 20 seconds.   Completion arteriogram revealed an excellent technical result.  No  residual right external iliac artery origin stenosis.   Following this, the guidewire was then removed from the right femoral  sheath, and the bilateral lower extremity runoff arteriography was  obtained by injection through the right femoral sheath.  This revealed  the right-to-left femoral-femoral bypass to be widely patent.  The  profunda femoris arteries were intact bilaterally.  The superficial  femoral artery was patent bilaterally.  The popliteal artery was patent  bilaterally.  There was occlusion of the right posterior tibial artery.  The right calf revealed intact peroneal and anterior tibial arteries.  Three-vessel tibial artery runoff was present in the left calf.   This completed the arteriogram procedure.  No  apparent complications.  The patient was transferred to the holding area in stable condition.  She did receive 5000 units of heparin intravenously prior to  intervention, with an ACT measured at 272 seconds.   FINAL IMPRESSION:  1. Right external iliac artery stenosis, successfully treated with      percutaneous transluminal angioplasty and stent.  2. Patent right-to-left femoral-femoral bypass.  3. Intact infrainguinal runoff bilaterally, with single-vessel right      posterior tibial artery occlusion.   DISPOSITION:  These results have been reviewed with the patient and  family.  The patient will be planned to be discharge later today, with  followup in the office in 3-4 weeks.      Balinda Quails, M.D.  Electronically Signed     PGH/MEDQ  D:  05/19/2007  T:  05/19/2007  Job:  [id number]

## 2010-10-29 NOTE — Assessment & Plan Note (Signed)
[facility]                          K[facility] Hansen   Kelsey Hansen, Kelsey Hansen                        MRN:          [id number]  DATE:03/09/2007                            DOB:          [birth year]    PRIMARY CARDIOLOGIST:  Learta Codding, M.D., (new).   REASON FOR VISIT:  [name] is a very pleasant 45 year old female who  unfortunately has extensive and complex past medical history including  significant coronary and peripheral atherosclerosis, previously followed  by Dr. Randa Evens in [place], who is now here to establish with  Dr. Andee Lineman in our [facility].   From a cardiac standpoint, the patient's history is notable for prior  non-ST elevation myocardial infarction treated with balloon angioplasty  of a high grade distal circumflex lesion, by Dr. Samule Ohm, in November  2007.  Residual anatomy at that time notable for an 80% distal right  coronary artery lesion (not amenable to PCI), 50% mid first diagonal  stenosis, and diffuse disease of the ramus intermedius.  LV function  preserved (EF of 55%) with posterior basal akinesis.   Dr. Samule Ohm also noted significant distal atherosclerosis at that time,  including moderate narrowing of the distal aorta, 100% occlusion of the  left common iliac artery, and at least 80% stenosis of the proximal  right common iliac artery.  Both renal arteries were single and  angiographically normal.   Dr. Samule Ohm placed the patient on Plavix to be continued for at least one  year, in addition to carvedilol and high dose Statin.  More recently,  however, the patient underwent extensive peripheral vascular surgical  revascularization by Dr. Liliane Bade on December 28, 2006 with a right-left  femoral-femoral bypass graft for treatment of severe limiting left lower  extremity claudication.  It was noted at that time that she had  placement of a right common iliac stent, but with unsuccessful attempt  at  recanalization of the left common iliac occlusion.   Her postoperative course was benign from a cardiac standpoint.  Of Hansen,  following her coronary PCI in November of 2007 she did develop a  brachial artery occlusion, requiring repair by Dr. Liliane Bade.  She also  then developed a DVT of the right upper extremity, for which she  required rehospitalization and a three month course of Coumadin  anticoagulation.   The patient has recently been followed closely by Dr. Liliane Bade for  postoperative care and is scheduled to see him later this week.  She  reports significant improvement and progressive healing of both groin  incision sites, with no further evidence of infection by recent followup  by Dr. Madilyn Fireman.  Of Hansen, the patient is currently not on an antibiotic.   From a cardiac perspective, the patient does report occasional chest  pain but this is not strictly correlated with any specific activity or  exertion.  Of Hansen, she does not have any nitroglycerin with her.   The patient also had complete dental extraction for treatment of chronic  periodontitis, following her peripheral vascular surgery.   Electrocardiogram today reveals sinus  bradycardia at 49 beats per minute  with normal axis and symmetric T wave inversion with Q waves in leads V1  and V2.   CURRENT MEDICATIONS:  1. Plavix.  2. Aspirin 81 mg daily.  3. Carvedilol 25 mg b.i.d.  4. Simvastatin 80 mg q.h.s.  5. Prilosec OTC.   PHYSICAL EXAMINATION:  VITAL SIGNS:  Blood pressure 126/80, pulse 65 and  regular.  Weight 129.6.  GENERAL APPEARANCE:  A 45 year old female sitting upright in no  distress.  HEENT:  Normocephalic, atraumatic.  NECK:  Palpable bilateral carotid pulses but no bruits.  No jugular  venous distention.  LUNGS:  Clear to auscultation in all fields.  HEART:  Regular rate and rhythm, (S1, S2).  No significant murmurs.  No  rubs.  ABDOMEN:  Soft, nontender, intact bowel sounds.  Both groins have   patches on them with no evidence of surrounding hematoma or oozing.  EXTREMITIES:  Nonpalpable dorsalis pedis pulses/posterior tibialis  pulses, but feet otherwise warm to touch.  There is mottling of the skin  bilaterally.  There is faint left popliteal pulse but nonpalpable right  popliteal pulse.  NEUROLOGICAL:  Flat affect, but no focal deficits.   IMPRESSION:  1. Multivessel coronary artery disease.      a.     As outlined above.      b.     Non-STEMI/percutaneous transluminal coronary angioplasty       distal circumflex, November of 2007.      c.     Preserved left ventricular function.  2. Severe peripheral vascular disease.      a.     Status post right femoral-femoral bypass graft, July of       2008.  3. Asymptomatic sinus bradycardia.  4. Dyslipidemia.  5. History of tobacco.  6. Probable gastroesophageal reflux disease.   PLAN:  1. Adjust current medication regimen with addition of Imdur 30 mg      daily for treatment of possible breakthrough angina pectoris.  We      will assess this further once she returns for early clinic followup      in the next several weeks.  2. Patient will be provided with a prescription for p.r.n.      nitroglycerin and instructions as to its' use.  3. Followup fasting lipid/liver profile.  4. Add Zantac 150 mg q.h.s. for suppression of symptoms suggestive of      gastroesophageal reflux disease.  I would prefer a proton pump      inhibitor, but the patient has severe financial      constraints.  5. Schedule early return clinic followup with myself and Dr. Andee Lineman in      four to six weeks.      Gene Serpe, PA-C  Electronically Signed      Learta Codding, MD,FACC  Electronically Signed   GS/MedQ  DD: 03/09/2007  DT: 03/10/2007  Job #: [id number]   cc:   Balinda Quails, M.D.

## 2010-10-29 NOTE — Assessment & Plan Note (Signed)
[facility]                            CARDIOLOGY OFFICE NOTE   SHENICA, HOLZHEIMER                        MRN:          [id number]  DATE:10/19/2006                            DOB:          [birth year]    HISTORY OF PRESENT ILLNESS:  Kelsey Hansen is a 45 year old woman who  suffered non-ST elevation myocardial infarction in November 2007. I  found her to have a 90% stenosis of the AV groove circumflex, which I  treated with balloon angioplasty. This procedure had to be done due via  right brachial axis due to severe bilateral iliac disease. It was  complicated by a brachial artery occlusion, which was repaired by Dr.  Madilyn Fireman. She then developed a right arm deep venous thrombosis for which  she was again hospitalized and treated with three months of Coumadin.   Since then, she has generally been doing nicely. 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 She has had some chest discomfort primarily  with use of her arms and rarely with walking. She has not had any pain  at rest. It is different than that which accompanied her myocardial  infarction and there is no associated dyspnea or diaphoresis. It does  not radiate.   CURRENT MEDICATIONS:  1. Plavix 75 mg daily.  2. Aspirin 81 mg daily.  3. Alprazolam 0.25 mg p.r.n.  4. Prilosec over-the-counter.  5. Carvedilol 12.5 mg twice per day.  6. Zocor 80 mg daily.   PHYSICAL EXAMINATION:  She is generally well-appearing in no distress  with heart rate of 67, blood pressure 110/60, weight of 134 pounds.  Weight is stable.  She has no jugulovenous distention, thyromegaly or lymphadenopathy.  Respiratory effort is normal.  LUNGS:  Clear to auscultation.  She has a nondisplaced point of maximal cardiac impulse. There is a  regular rate and rhythm without murmur, rub or gallop.  ABDOMEN: Soft, nondistended and nontender. There is no  hepatosplenomegaly. Bowel sounds are normal.  The right arm is free of  edema. The radial pulse is 2+.  Carotid pulses are 2+ bilaterally without bruit.   An electrocardiogram demonstrates normal sinus rhythm and is a normal  EKG.   IMPRESSIONS/RECOMMENDATIONS:  1. Atherosclerotic coronary artery disease: Status post non-ST      elevation myocardial infarction. She has diffuse disease with the      most severe stenosis being in 80% of the very small right coronary      artery (RCA), which we will manage medically. Ejection fraction      55%. Will increase Coreg to 25 mg twice daily.  2. Lower extremity peripheral arterial disease:  Severe right common      iliac artery stenosis and occluded left. These are asymptomatic.      Will manage conservatively.  3. Tobacco abuse: I applauded her on remaining off of tobacco.  4. Hypercholesterolemia: Continue Zocor. Check lipids and LFTs today.   DISPOSITION:  Followup with Dr.  Andee Lineman in 12 weeks time.     Salvadore Farber, MD  Electronically Signed    WED/MedQ  DD: 10/19/2006  DT: 10/19/2006  Job #: [id number]

## 2010-10-29 NOTE — Assessment & Plan Note (Signed)
OFFICE VISIT   JAMIAH, HOMEYER  DOB:  [birth year]                                       01/28/2007  CHART#:[id number]   The patient is status post femoral femoral bypass carried out December 28, 2006, [facility].  She was seen by Dr. Hart Rochester on January 12, 2007,  with bilateral groin breakdown.  No evidence of deep infection.  Placed  on Keflex along with daily dressing changes.  These have been carried  out by her surrogate mother.   Doppler evaluation carried out today reveals patent femoral femoral  graft with ankle brachial indices 0.86 on the right 0.79 on the left.   BP is 142/77, pulse 57 per minute, regular respirations 18 per minute.  Femoral femoral graft is patent.  There is superficial full length wound  breakdown on the right side without exposed graft and a small area of  wound breakdown on the left without exposed graft.  Granulation tissue  is present.   We will continue current dressing changes.  Plan to follow up in 2-3  weeks here in the office.   Balinda Quails, M.D.  Electronically Signed   PGH/MEDQ  D:  01/28/2007  T:  01/29/2007  Job:  [id number]

## 2010-10-29 NOTE — Discharge Summary (Signed)
NAMELOUVINA, CLEARY                 ACCOUNT NO.:  [id number]   MEDICAL RECORD NO.:  [id number]          PATIENT TYPE:  INP   LOCATION:  2031                         FACILITY:  [facility]   PHYSICIAN:  Balinda Quails, M.D.    DATE OF BIRTH:  11-Mar-1966   DATE OF ADMISSION:  12/28/2006  DATE OF DISCHARGE:  01/02/2007                               DISCHARGE SUMMARY   ADMISSION DIAGNOSIS:  Peripheral vascular disease with left lower  extremity claudication.   SECONDARY DIAGNOSES:  1. Peripheral vascular disease with left lower extremity claudication,      status post right to left femorofemoral bypass grafting.  2. History of coronary artery disease, status post myocardial      infarction in November, 2007, status post PTCA of the distal      circumflex coronary artery.  3. History of aortoiliac occlusive disease status post right common      iliac PTA and stenting in the right internal iliac artery.  4. History of dyslipidemia.  5. Tobacco abuse.  6. Anxiety.  7. Postoperative leukocytosis, improving, and mild postoperative acute      blood loss anemia.  8. Allergies to LORTAB AND DARVOCET.  9. Poor dentition, with avulsion of a molar tooth during intubation      (tooth found to be in the stomach).  10.Chronic periodontitis status post multiple teeth extractions.   PROCEDURES:  December 28, 2006:  Direct laryngoscopy and esophagoscopy by  Dr. Lucky Cowboy.  Then on December 28, 2006, right to left femorofemoral  bypass using 7 mm Dacron Hemashield graft.  December 31, 2006:  Extraction  of remaining teeth, 3, 6, 11 and 22, four quadrant valvuloplasty,  bilateral mandibular lingual tori resections by Dr. Cindra Eves.   CONSULTS:  ENT, dental, dietician and physical therapy.   BRIEF HISTORY:  Ms. Wilmouth is a 45 year old Caucasian female with  peripheral vascular disease and coronary artery disease.  She has  previously undergone PTCA for acute MI.  She has severe limiting left  lower extremity  claudication.  She had a right common iliac stent  placed, and an attempt at recannulization on her left common iliac  occlusion was unsuccessful.  Dr. Madilyn Fireman recommended she undergo right to  left femorofemoral bypass graft.   HOSPITAL COURSE:  The patient was electively admitted to [facility] on December 28, 2006.  She was taken to the operating room.  However, during intubation, a tooth was dislodged and was initially felt  to be in the trachea, so an urgent ENT consult was requested by  anesthesia and she was seen by Dr. Lucky Cowboy, who performed the above-  mentioned procedure and found that the tooth was actually in the  stomach.  This was confirmed by x-ray.  Dr. Madilyn Fireman then proceeded with  the right to left femorofemoral bypass graft.  Postoperatively, dental  consult was requested as the patient had quite poor dentition.  X-rays  were done, and Dr. Kristin Bruins ultimately felt she would require removal of  her remaining teeth.  This was performed on December 31, 2006.  A dietician  did see her following surgery to review soft diet or pureed, if the  patient is admitted.  According to her, the patient tolerated her food.  In regards to her femorofemoral bypass surgery, she was seen by physical  therapy, who felt that she would be okay for discharge home without  acute PT needs.  She did have postoperative ABIs on December 30, 2006, which  showed 0.67 on the right, 0.65 on the left.  This was improved on the  left from her preoperative indices.  Other issues addressed include  leukocytosis, with a white blood cell count peaking on December 29, 2006, at  20,400.  This was felt most likely related to the stress of surgery and  was followed by serial white blood cells.  It is now down to 12.0 on  January 01, 2007.  She did have a urinalysis that was negative.  She also  was monitored for mild acute blood loss anemia with hemoglobin and  hematocrit of 10.6 and 31.3.  At the time of dictation, she had  been  started on iron supplement daily.  She did develop some drainage from  her right groin.  She was started on a one week course of Keflex as a  precaution.  She did have some intermittent low-grade fevers, which were  felt possibly secondary to her right groin drainage.  At the time of  dictation, vital signs have been stable.  Last blood pressure was  112/67, heart rate at 90 and oxygen saturation 96% on room air.  She is  awake following removal of her Foley catheter and pain has been  controlled now on oral medication.  Ms. Shimer continues to make steady  progress.  If there is no significant change in her status as  anticipated, she will be discharged home on January 02, 2007.   RECENT LABS:  Current labs at the time of dictation show white blood  count is decreasing at 12.0, hemoglobin and hematocrit 10.6 and 31.3,  respectively, platelet count 289, sodium 135, potassium 3.6, chloride  98, CO2 30, BUN 2, creatinine 0.7, blood glucose is 124.  LFTs are  normal.  Her blood albumin on admission was 3.2.   DISCHARGE INSTRUCTIONS:  She is to continue a heart-healthy diet.  Increase her activity slowly.  She may shower and clean her incision  gently with soap and water.  She should apply a gauze dressing to her  groin site daily if drainage occurs, and should call if there is  purulent drainage or erythema around her incision site.  She is to also  call if she develops fever greater than 101 or increased pain.  She  should avoid driving or lifting for the next three weeks.  She should  follow a soft-consistency diet and is to follow up with Dr. Kristin Bruins in  seven to ten days and is to call [phone] to schedule an appointment.  She will see Dr. Madilyn Fireman at a vascular vein specialist's office in  approximately three weeks, with Doppler study.  Our office will contact  her with regards to setting up an appointment at the same time.   DISCHARGE MEDICATIONS:  1. Keflex 500 mg p.o. q.6h. x7  days.  2. Ferrous sulfate 325 mg p.o. daily.  3. Oxycodone 5 mg one to two tabs p.o. q.4h. p.r.n. pain.  4. Prilosec OTC one p.o. daily.  5. Plavix 75 mg one p.o. daily.  6. Carvedilol 25 mg p.o. b.i.d.  7. Simvastatin 80 mg p.o. q.h.s.  8. Aspirin 81 mg p.o. daily.  9. Benadryl p.r.n. per home nursing.      Jerold Coombe, P.A.      Balinda Quails, M.D.  Electronically Signed    AWZ/MEDQ  D:  01/01/2007  T:  01/02/2007  Job:  [id number]   cc:   Balinda Quails, M.D.  Charlynne Pander, D.D.S.  Lucky Cowboy, MD

## 2010-10-29 NOTE — Procedures (Signed)
BYPASS GRAFT EVALUATION   INDICATION:  Follow up right common iliac artery and external iliac  artery stents.   HISTORY:  Diabetes:  No.  Cardiac:  Yes.  Hypertension:  Yes.  Smoking:  Yes.  Previous Surgery:  Left-to-right femoral-femoral bypass graft, right  common iliac and external iliac artery PTA stents.   SINGLE LEVEL ARTERIAL EXAM                               RIGHT              LEFT  Brachial:                    151                144  Anterior tibial:             111                78  Posterior tibial:            132                87  Peroneal:  Ankle/brachial index:        0.87               0.58   PREVIOUS ABI:  Date: 07/27/09  RIGHT:  0.97  LEFT:  0.74   LOWER EXTREMITY BYPASS GRAFT DUPLEX EXAM:   DUPLEX:  Patent right common iliac and right external iliac stent with  elevated velocities of 403 cm/s in right common femoral artery.   IMPRESSION:  1. Stable ankle brachial indices bilaterally.  2. Patent right common iliac artery and right external iliac artery      with elevated velocities, as noted above.   ___________________________________________  Janetta Hora. Fields, MD   CB/MEDQ  D:  02/21/2010  T:  02/21/2010  Job:  [id number]

## 2010-10-29 NOTE — Assessment & Plan Note (Signed)
OFFICE VISIT   RETINA, Kelsey Hansen  DOB:  April 21, 1966                                       01/19/2007  CHART#:[id number]   The patient had a right to left fem-fem bypass graft by Dr. Madilyn Fireman on  12/28/2006.  She developed some wound separation, especially on the  right side, and was seen by Dr. Hart Rochester in the office on 01/12/2007.  She  was brought back for a 1-week followup visit.   EXAMINATION:  She is afebrile and her temperature is 97.2.  She has an  easily palpable graft pulse.  The right groin wound separation has  improved markedly according to the patient's friend, who has been doing  the dressing changes.  The wound is granulating nicely, and there was  really nothing to debride on the right side.  On the left side, there  was a small amount of necrotic tissue, which I debrided.  Only the  inferior aspect of the incision is open.  Both wounds appear quite  superficial, and I think the graft is well-protected.  She will continue  on her antibiotics.  She has been on Keflex.  She will see Dr. Madilyn Fireman a  week from Thursday.   [name]. Edilia Bo, M.D.  Electronically Signed   CSD/MEDQ  D:  01/19/2007  T:  01/20/2007  Job:  [id number]

## 2010-10-29 NOTE — Assessment & Plan Note (Signed)
OFFICE VISIT   MADDELINE, ROORDA  DOB:  [birth year]                                       02/11/2007  CHART#:[id number]   Onelia returns today for continued follow up and evaluation of her groin  incisions. She is status post femoral to femoral bypass that was carried  out on 12/28/2006.   The left groin incision has now almost completely closed and requires no  further packing. The right groin incision has improved considerably.  There is no evidence of deep infection. No graft involvement.   Continue current dressing changes and return in one month.   Balinda Quails, M.D.  Electronically Signed   PGH/MEDQ  D:  02/11/2007  T:  02/13/2007  Job:  [id number]

## 2010-10-29 NOTE — Assessment & Plan Note (Signed)
OFFICE VISIT   [name], [name]  DOB:  02-28-1966                                       03/11/2007  CHART#:[id number]   The patient is status post right to left femoral-femoral bypass carried  out 12/28/2006 at [facility].  Has done well following surgery  generally.  Did have some superficial wound breakdown bilaterally  without exposed graft.  This is now completely healed.  The fem-fem  bypass is patent.  She has some mild paresthesias in her thighs  bilaterally.  Good circulation.  Last ankle brachial index measuring  0.86 and 0.79.   The patient will continue to gradually increase her activity.  She will  return per protocol followup for femoral-femoral bypass.   Kelsey Hansen, M.D.  Electronically Signed   PGH/MEDQ  D:  03/11/2007  T:  03/12/2007  Job:  [id number]   cc:   Learta Codding, MD,FACC

## 2010-10-29 NOTE — Assessment & Plan Note (Signed)
OFFICE VISIT   Kelsey Hansen, Kelsey Hansen  DOB:  January 29, 1966                                       02/21/2010  CHART#:[id number]   HISTORY OF PRESENT ILLNESS:  The patient is a 45 year old female with  prior symptoms of claudication.  She was previously followed by my  partner Dr. Madilyn Fireman.  Since he has now left the practice I have assumed  her care.  She previously underwent a right common iliac artery stent  and right external iliac artery  angioplasty in May 2008.  She  subsequently underwent a right to left femoral-femoral bypass in July  2008.  She then had a right external iliac artery stent placed in  December 2008.  Subsequent to that, she had occlusion of her femoral-  femoral bypass which the patient states lasted less than a year.  She  currently experiences some claudication symptoms in both legs but states  that her walking distance is variable.  This is usually a few blocks.  She is not really bothered by this currently.  She stated that her  symptoms really are unchanged since 2008.  Unfortunately, she continues  to smoke half a pack of cigarettes a day.  Greater than 3 minutes were  spent regarding smoking cessation counseling today.  She denies any  symptoms of TIA, amaurosis or stroke.   REVIEW OF SYSTEMS:  She also denies any recent weight loss or gain.   PAST MEDICAL HISTORY:  Significant for elevated cholesterol, coronary  artery disease with previous myocardial infarction in 2007.   SOCIAL HISTORY:  As mentioned above, she smokes one half pack of  cigarettes per day.  She is single.   FAMILY HISTORY:  Unremarkable.   PHYSICAL EXAMINATION:  Vital signs:  Blood pressure is 161/80 in the  right arm, heart rate 82 and regular.  Temperature is 97.8.  HEENT:  Unremarkable.  NECK:  Has 2+ carotid pulses without bruit.  Chest:  Clear to auscultation.  Heart:  Exam is regular rate rhythm without  murmur.  Abdomen:  Soft, nontender, nondistended.  No  masses.  Extremities:  She has absent femoral, popliteal and pedal pulses  bilaterally.  Feet are pink, warm, well-perfused.  Skin:  Has no open  ulcers or rashes.  Musculoskeletal:  Exam shows no major joint  deformities.   She had bilateral ABIs performed today which showed an ABI on the right  side of 0.87 and on the left of 0.58.  These are both slightly decreased  since February 2011.  The right external iliac artery and common iliac  artery stent were patent but there were some increased velocities across  the right common femoral artery.   In summary, at this point in Kelsey Hansen is stable as far as her  claudication is concerned with no real changes since 2008.  She has had  fairly limited durability of all her previous vascular procedures.  I  believe the best option at this point is continued surveillance.  If her  symptoms deteriorated over time to critical limb threatening ischemia we  would consider an additional intervention.  However, at this point for  claudication symptoms alone I believe she is best managed with risk  factor modification with smoking cessation, continued antiplatelet  therapy and modification of her other risk factors of hypertension and  elevated cholesterol.  She will have follow-up ABIs in 6 months' time  for continued evaluation of her claudication symptoms.     Janetta Hora. Fields, MD  Electronically Signed   CEF/MEDQ  D:  02/21/2010  T:  02/22/2010  Job:  [id number]   cc:   Learta Codding, MD,FACC

## 2010-10-29 NOTE — Assessment & Plan Note (Signed)
[facility]                          [facility]   Kelsey Hansen, Kelsey Hansen                        MRN:          [id number]  DATE:04/12/2007                            DOB:          [birth year]    PRIMARY CARDIOLOGIST:  Kelsey Codding, MD, [facility]   REASON FOR VISIT:  Kelsey Hansen returns to the clinic for early clinic  followup. During a recent scheduled followup with Dr.  Liliane Hansen, the  patient complained of some chest discomfort and shortness of breath, and  she was referred back to [place] today for reevaluation and consideration of  possible cardiac catheterization.   The patient continues to show continued improvement with respect to  healing of her wounds, after undergoing complex lower extremity vascular  surgery this past July. At time of her followup on September 25, with  Kelsey Hansen, it was noted that her wounds had now completely healed.  ABIs at that time were 0.86 and 0.79.   When the patient presented to me on September 23 so as to establish with  Dr.  Andee Hansen here in [place], I placed her on Imdur 30, Zantac and provided  her with p.r.n. nitroglycerine. She reports today that she noted  palpable improvement of her symptoms after starting the Imdur, but that  she has had to take sublingual nitroglycerine on several occasions in  the recent past. Of note, however, these episodes are not always  precipitated by activity. She also points out that she resumed smoking  tobacco, secondary to the considerable stress over these past few weeks.   On electrocardiogram today shows sinus bradycardia at 59 bpm with  chronic, nonspecific ST changes as noted previously.   CURRENT MEDICATIONS:  1. Aspirin 81 daily.  2. Carvedilol 25 b.i.d.  3. Simvastatin 80 daily.  4. Plavix.  5. Ranitidine 150 q nightly.  6. Imdur 30 daily.  7. Iron.   PHYSICAL EXAMINATION:  Blood pressure 118/72, pulse 72 regular, weight  128.  GENERAL: A 45 year old  female sitting upright in no distress.  HEENT: Normocephalic, atraumatic.  NECK: Palpable bilateral carotid pulses.  LUNGS:  Diminished breath sounds at bases, but without crackles or  wheezes.  HEART: Regular rate and rhythm (S1, S2). No significant murmurs. No  rubs.  ABDOMEN: Soft, nontender.  EXTREMITIES: Both groins stable with completely healed incisions;  minimally palpable pulses, more so on the right. Minimally palpable  peripheral pulses with no significant edema.  NEURO: Flat affect, but no focal deficit.   IMPRESSION:  1. Multi-vessel coronary artery disease.      a.     Non-ST elevation myocardial infarction/percutaneous coronary       intervention distal circumflex artery, November 2007.      b.     Residual 80% distal right coronary artery; 50% first       diagonal; and diffuse ramus intermedius disease. Medical therapy       recommended.      c.     Ejection fraction 55%; posterobasilar akinesis.      d.     Kelsey Hansen  complication of brachial artery occlusion,       requiring surgical repair, and subsequent right upper extremity       deep venous thrombosis.  2. Severe peripheral vascular disease.      a.     Status post right fem-fem bypass graft, July 2008.  3. Ongoing tobacco.  4. Sinus bradycardia.      a.     Tolerating Coreg.  5. Dyslipidemia.   PLAN:  1. Following review of patient's current clinical status, and in light      of her known severe CAD with associated complication of brachial      artery occlusion requiring surgical repair, current recommendation      is to proceed with an initial non-invasive ischemic workup      consisting of a pharmacologic stress test. We will, however,      maintain a low threshold for repeat cardiac catheterization, at      which time we would recommend left radial artery approach in light      of the patient's aforementioned complications. The patient is      agreeable with this initial approach. Of note, THIS TEST  IS TO BE      PERFORMED ON FULL MEDICATION REGIMEN.  2. Up titrate Imdur to 60 mg daily, for more aggressive anti-anginal      treatment.  3. Schedule return clinic followup with myself and Kelsey Hansen in one      month for continued close monitoring, review of study results and      further recommendations.  4. Followup with Dr.  Liliane Hansen as scheduled.      Kelsey Serpe, PA-C  Electronically Signed      Kelsey Codding, MD,FACC  Electronically Signed   GS/MedQ  DD: 04/12/2007  DT: 04/12/2007  Job #: [id number]   cc:   Kelsey Hansen, M.D.

## 2010-10-29 NOTE — Assessment & Plan Note (Signed)
OFFICE VISIT   Kelsey Hansen, Kelsey Hansen  DOB:  02/02/1966                                       06/24/2007  CHART#:[id number]   The patient returns today after undergoing a recent percutaneous  intervention on the right external iliac artery.  She has a right to  left femoral-femoral bypass.  Had a drop in her ankle brachial indexes.  Developed a recurrent right iliac stenosis after previously undergoing  right common iliac artery stenting.   On 05/19/2007 she had repeat arteriogram with right external iliac PTA  and stent.  Her right to left femoral-femoral bypass was patent, and she  had, in fact, infrainguinal runoff bilaterally with occlusion of the  right posterior tibial artery.   The patient's activity level has been gradually increasing.  She  continues to complain of some paresthesia in the right medial thigh area  bilaterally.  She has no night pain or rest pain.  Denies recent chest  pain.  No shortness of breath.   She appears generally well.  In no acute distress.  BP 142/91, pulse 96  per minute, respirations 16 per minute.  Abdomen is soft and nontender.  No masses or organomegaly.  Bowel sounds active.  No bruits.  Right  femoral pulse intact at 2+.  Fem-fem graft patent.  No palpable  infrainguinal pulses.  The feet are well perfused.  No ischemic changes.   The patient continues to do reasonably well following her recent  intervention.  I will plan to follow up with her per protocol for her  femoral-femoral graft and right iliac stent.   Balinda Quails, M.D.  Electronically Signed   PGH/MEDQ  D:  06/24/2007  T:  06/25/2007  Job:  [id number]

## 2010-10-29 NOTE — Assessment & Plan Note (Signed)
OFFICE VISIT   Kelsey Hansen, Kelsey Hansen  DOB:  11-15-1965                                       04/08/2007  CHART#:[id number]   The patient is a 45 year old female with a history of known coronary  artery disease status post circumflex PTCA carried out by Dr. Samule Ohm in  November of last year.  She has undergone a right to left femoral-  femoral bypass in July of this year.   She presented today complaining of right arm pain.  Was concerned about  a possible clot.  She is on Plavix and aspirin.   BP 144/93, pulse 86 per minute, respirations 18 per minute.  Evaluation  of her right arm reveals 2+ right radial pulse.  There is an area of  ecchymosis and some induration noted at the level of the mid humerus  laterally.  This appeared to be posttraumatic.   The patient reassured.  There is no evidence of venous thrombosis or  arterial problem to the right arm.   On further questioning, she has begun smoking again, and does describe  some discomfort in her chest and some mild shortness of breath.  I do  think she needs to get back to see Dr. Andee Lineman for  an evaluation and possible recatheterization.  She is seen today with  her surrogate mother and they assure me that they will contact Dr.  Andee Lineman right away.   Balinda Quails, M.D.  Electronically Signed   PGH/MEDQ  D:  04/08/2007  T:  04/09/2007  Job:  [id number]   cc:   Learta Codding, MD,FACC

## 2010-10-29 NOTE — Procedures (Signed)
AORTA-ILIAC DUPLEX EVALUATION   INDICATION:  Followup bilateral lower extremity PVD.   HISTORY:  Diabetes:  No.  Cardiac:  MI.  Hypertension:  No.  Smoking:  Quit.  Previous Surgery:  Right CIA and EIA, PTA/stent on Nov 09, 2005, fem-fem  (right to left) bypass graft December 28, 2006.  Both by Dr. Madilyn Fireman.               SINGLE LEVEL ARTERIAL EXAM                              RIGHT                  LEFT  Brachial:                  110.                   113.  Anterior tibial:           68.                    62.  Posterior tibial:          70.                    65.  Peroneal:  Ankle/brachial index:      0.62.                  0.58.  Previous ABI/date:         January 28, 2007 0.86.  January 28, 2007 0.79.   AORTA-ILIAC DUPLEX EXAM  Aorta - Proximal     69 Cm/s  Aorta - Mid          71 cm/s  Aorta - Distal       54 cm/s   RIGHT                                   LEFT  62 cm/s           CIA-PROXIMAL          Not imaged.  122 cm/s          CIA-DISTAL  208 cm/s          HYPOGASTRIC  395 Cm/s          EIA-PROXIMAL  236 Cm/s          EIA-MID  123 cm/s          EIA-DISTAL   IMPRESSION:  Patent abdominal aorta without significant stenosis.   Right common iliac artery appears patent.   Elevated velocities noted in the proximal external iliac artery of 395  cm/s suggestive of 50-75% stenosis.   ABIs show significant decrease from previous study.   ___________________________________________  P. Liliane Bade, M.D.   AS/MEDQ  D:  04/12/2007  T:  04/13/2007  Job:  [id number]

## 2010-10-29 NOTE — Assessment & Plan Note (Signed)
[facility]                          [facility]   Kelsey Hansen, Kelsey Hansen                        MRN:          [id number]  DATE:04/20/2007                            DOB:          08/18/1965    PRIMARY CARDIOLOGIST:  Dr. Lewayne Bunting.   REASON FOR VISIT:  Scheduled clinic followup.  Please refer to my office  note of October 27, for full details.   At that time, I recommended up-titration of Imdur to the current dose of  60 mg daily for treatment of her breakthrough recurrent chest  discomfort, in the event that this represented true angina.  I also  referred her for an adenosine stress Cardiolite.  This was reviewed by  Dr. Myrtis Ser, and perfusion imaging suggested no evidence of prior infarct  or ischemia in either the anterior, apical, or lateral walls.  Dr. Myrtis Ser,  however, noted that the inferior wall could not be completely assessed,  secondary to interference.  Ejection fraction was calculated at 62%.   This study was also reviewed by Dr. Andee Lineman, who in turn communicated  these results to Dr. Liliane Bade, in preparation for recommended upcoming  lower extremity vascular surgery.  The patient informs me today that,  after our last visit, she followed up with Dr. Liliane Bade as previously  scheduled, and that she was told that she had a new blockage in her  right lower extremity.  She is thus scheduled for re-do surgery on  November 19, by Dr. Madilyn Fireman.   With respect to her chest pain, she reports that after we increased the  Imdur, she has had significant decrease in both the frequency and  intensity of her right chest/shoulder discomfort.   She now presents with a lower blood pressure, however, but reports no  definite pre-syncope/syncope.  Of note, she is on Coreg 25 b.i.d. and  has been so for quite some time now.   CURRENT MEDICATIONS:  1. Aspirin 81 daily.  2. Carvedilol 25 b.i.d.  3. Simvastatin 80 daily.  4. Plavix 75 daily.  5. Ranitidine 150 nightly.  6. Isosorbide 60 mg daily.   PHYSICAL EXAM:  Blood pressure 93/58, pulse 50 and regular, weight 137.  GENERAL:  A 45 year old female sitting upright in no distress.  HEENT:  Normocephalic, atraumatic.  NECK:  Palpable bilateral carotid pulses without bruits; no JVD.  LUNGS:  Clear to auscultation in all fields.  HEART:  Regular rate and rhythm (S1, S2), no significant murmurs.  ABDOMEN:  Benign.  EXTREMITIES:  Trace pedal edema.  NEURO:  Flat affect, but no focal deficit.   IMPRESSION:  1. Multivessel coronary artery disease.      a.     As previously outlined.      b.     Chest discomfort, improved following recent up-titration of       Imdur.      c.     Recent negative adenosine stress Cardiolite; ejection       fraction 62%.      d.     Non-ST-elevation myocardial infarction/percutaneous  transluminal coronary angioplasty distal circumflex artery,       November 2007.  2. Severe peripheral vascular disease.      a.     Status post right-left fem-fem bypass graft, July 2008.  3. Relative hypotension.  4. Dyslipidemia.  5. Ongoing tobacco.  6. Probable gastroesophageal reflux disease.  7. Sinus bradycardia.   PLAN:  1. The patient is cleared to proceed with redo lower extremity      vascular surgery on November 19, as scheduled with Dr. Liliane Bade.      The results of this stress Cardiolite were recently reviewed by Dr.      Andee Lineman with Dr. Liliane Bade, at which time it was felt that the      patient could proceed with surgery.  We will plan on having the      patient return to our clinic for early post-hospital followup for      continued close monitoring of her chest discomfort and known      coronary artery disease.  2. Down-titrate Coreg to 12.5 b.i.d. in light of her relative      hypotension and persistent bradycardia.  Of note, we will reassess      the need for continued treatment with Coreg, particularly in light      of her  preserved left ventricular function.  3. The patient once again instructed to stop smoking tobacco.      Gene Serpe, PA-C  Electronically Signed      Learta Codding, MD,FACC  Electronically Signed   GS/MedQ  DD: 04/20/2007  DT: 04/21/2007  Job #: [id number]   cc:   Balinda Quails, M.D.

## 2010-10-29 NOTE — Assessment & Plan Note (Signed)
[facility]                          [facility]   GERALDINE, SANDBERG                        MRN:          [id number]  DATE:10/30/2008                            DOB:          1966-01-08    REFERRING PHYSICIAN:  None.   HISTORY OF PRESENT ILLNESS:  The patient is a unfortunate 45 year old  female with significant peripheral vascular disease.  The patient is  status post non-ST-elevation myocardial infarction, November 2007.  She  has residual coronary artery disease.  She also has significant  peripheral vascular disease and is status post right fem-fem bypass  graft and as well as stenting of the right external iliac artery.  Unfortunately, she continues to smoke.  She denies any significant chest  pain on exertion.  She states that on occasion, she has chest pain at  rest for which she takes nitroglycerin.  She uses about 3 or 4 times a  month which is her usual quota.  She denies any current claudication.  She has no palpitations or syncope.   MEDICATIONS:  1. Aspirin 81 mg p.o. daily.  2. Plavix 75 mg p.o. daily.  3. Iron OTC.  4. Carvedilol 25 mg half a tablet p.o. b.i.d.  5. Isosorbide mononitrate 10 mg 2 tablets p.o. daily.  6. Fish oil 1200 mg p.o. q.a.m.   PHYSICAL EXAMINATION:  VITAL SIGNS:  Blood pressure 132/80, heart rate  81, weight 151 pounds.  NECK:  Normal carotid upstroke and no carotid bruits.  LUNGS:  Clear breath sounds bilaterally with somewhat diminished at the  bases.  HEART:  Regular rate and rhythm with normal S1 and S2 and no  pathological murmurs.  ABDOMEN:  Soft, nontender.  No rebound or guarding.  Good bowel sounds.  EXTREMITIES:  No cyanosis, or clubbing, or edema.   PROBLEMS:  1. Multivessel coronary artery disease.      a.     Status post non-STEMI/PTCA of the distal circumflex November       2007.      b.     Residual 80%, distal RCA 50%, first diagonal diffuse ramus       intermedius  disease treated medically.      c.     Ejection fraction 55%.      d.     Brachial artery occlusion post catheterization of right       surgical repair and right upper extremity thrombosis.      e.     Negative adenosine Cardiolite study.  Ejection fraction 62%,       October 2008.  2. Severe peripheral vascular disease.      a.     Status post right fem and bypass graft May 2008.      b.     Status post stenting of the right external iliac artery       December 2008.  3. Ongoing tobacco use.  4. History of sinus bradycardia, stable.  5. Dyslipidemia (statin intolerance).   PLAN:  1. I have encouraged the patient to stop smoking.  She states that she  has cut down significantly.  2. Although, the patient appears to have some angina, it has been a      stable pattern and she has not used any frequent nitroglycerin      tablets.  I do not think any ischemic testing is necessary at this      point in time, but would continue to see  the patient medically and      follow her closely.  3. The patient can follow up with [place] in 6 months.      Learta Codding, MD,FACC  Electronically Signed    GED/MedQ  DD: 10/30/2008  DT: 10/31/2008  Job #: [id number]

## 2010-10-29 NOTE — Consult Note (Signed)
NAME:  Kelsey, Hansen NO.:  [id number]   MEDICAL RECORD NO.:  [id number]          PATIENT TYPE:  INP   LOCATION:  2031                         FACILITY:  [facility]   PHYSICIAN:  Charlynne Pander, D.D.S.DATE OF BIRTH:  1965/07/03   DATE OF CONSULTATION:  12/28/2006  DATE OF DISCHARGE:                                 CONSULTATION   HISTORY OF PRESENT ILLNESS:  Kelsey Hansen is a 45 year old female  referred by Dr. Liliane Bade and Dr. Sharee Holster for a dental  consultation.  The patient with recent intubation, which resulted in  avulsion of a molar tooth.  This was subsequently displaced into the  stomach.  This was confirmed by chest x-ray.  Dental consultation now  requested to evaluate other dentition and to provide dental treatment as  indicated.   MEDICAL HISTORY:  1. Peripheral vascular disease.      a.     Left leg claudication with right-to-left fem-fem bypass       grafting by Dr. Madilyn Fireman in the OR on December 28, 2006.  This was       complicated by the avulsion of a molar into the stomach during the       intubation procedure.  2. Coronary artery disease status post myocardial infarction in      November of 2007.  The patient is status post PTCA of the distal      circumflex coronary artery.  3. History of aortoiliac occlusive disease status post right common      iliac PTA and stenting of the right internal iliac PTA.  4. History of dyslipidemia.  5. History of tobacco abuse.  6. Anxiety.   ALLERGIES:  1. LORTAB.  2. DARVOCET.   MEDICATIONS:  1. Dilaudid per protocol.  2. Oxycodone per protocol.   Please NOTE: The patient was recently on Plavix therapy, which was  discontinued on December 21, 2006, to allow the surgery to proceed on December 28, 2006.   SOCIAL HISTORY:  The patient lives alone in [place], [place].  The  patient recently discontinued smoking in November of 2007.  The patient  is a nondrinker.  The patient has no children.   FAMILY  HISTORY:  Mother died at age 40 with a history of lung cancer and  heart disease.  Father is alive at age of 60.   FUNCTIONAL ASSESSMENT:  The patient was independent for ADLs prior to  this admission.   REVIEW OF SYSTEMS:  This is reviewed from the chart and history of  present illness from  this admission.   DENTAL HISTORY:  CHIEF COMPLAINT:  Dental consultation requested to  evaluate poor dentition.   HISTORY OF PRESENT ILLNESS:  The patient was undergoing intubation  procedure on December 28, 2006.  This was complicated by an avulsion of a  tooth, which eventually was found to be in the stomach.  X-ray confirmed  this.  Dr. Lucky Cowboy was consulted and proceeded with an emergent  pharyngoscopy and esophagoscopy as indicated with assistance in moving  the tooth into the stomach instead of  the airway.  Dental consultation  requested now to evaluate other poor dentition.   The patient with a history of losing molar tooth.  The patient also with  recent loss of several anterior teeth which were at risk for further  avulsion.  The patient currently denies any toothaches, swellings or  abscesses.  The patient was last seen in May of 2008 for an exam and  cleaning by a dentist in [place], [place].  The patient indicates that  now as if she had bad teeth and that they were loose.   EXAM:  GENERAL:  The patient is a well-developed, well-nourished female  in no acute distress.  VITAL SIGNS:  Blood pressure is 93/50, pulse is 45, respirations are 18,  temperature is 97.6.  HEAD:  There is no palpable submandibular lymphadenopathy noted at this  time.  INTERNAL:  The patient with normal saliva.  There is minimal heme in the  mouth due to the recent loss of teeth.  DENTITION:  The patient with multiple missing teeth.  Would need a  dental x-ray to identify the extent of the missing teeth.  PERIODONTAL: The patient with chronic advanced periodontal disease with  generalized gingival  recession and generalized tooth mobility of the  remaining teeth.  DENTALl CARIES:  There are multiple dental caries noted.  ENDODONTIC:  No obvious abscesses are noted at this time but will need  dental x-rays to rule out pericapical pathology.  CROWN OR BRIDGE: There are no crown or bridge restroatiosn noted.  PROSTHONDONTIC:  The patient denies the presence of dentures.  OCCLUSION:  The patient with poor occlusal scheme.   RADIOGRAPHIC INTERPRETATION:  Panoramic x-ray was ordered to be taken  when the patient is able to stand in the department of radiology.   PLAN/RECOMMENDATIONS:  1. I discussed the risks, benefits, complications of various treatment      options with the patient in relationship to her medical and dental      conditions.  We discussed various treatment options to include      total and subtotal extractions of remaining teeth with      alveoloplasty and pre-prosthetic surgery as indicated.  We also      discussed no treatment, periodontal therapy, dental restorations,      crown and bridge therapy, implant therapy,and root cancal therapy,      and replacing the missing teeth as indicated after adequate      healing.  The plan is to have a dental x-ray taken on Tuesday, December 29, 2006.  I will then review treatment options when I return to      see the patient on Wednesday, December 30, 2006.  Hopefully, an      operating room procedure would be able to be done this coming      Thursday in the morning on December 31, 2006.  In the meantime, if the      patient can remain off Plavix      therapy, that would assist in the dental extractions in the near      future.  If not, the patient will need to be followed for possible      outpatient therapy in the future with another period of time being      off Plavix therapy for a week before dental extractions can      proceed.      Charlynne Pander, D.D.S.  Electronically Signed  RFK/MEDQ  D:  12/28/2006  T:   12/29/2006  Job:  [id number]   cc:   Balinda Quails, M.D.  Lucky Cowboy, MD  Burna Forts, M.D.  Charlynne Pander, D.D.S.

## 2010-10-29 NOTE — Discharge Summary (Signed)
Kelsey Hansen, Kelsey Hansen NO.:  [id number]   MEDICAL RECORD NO.:  [id number]          PATIENT TYPE:  INP   LOCATION:  2031                         FACILITY:  [facility]   PHYSICIAN:  Balinda Quails, M.D.    DATE OF BIRTH:  1965/09/24   DATE OF ADMISSION:  12/28/2006  DATE OF DISCHARGE:  01/02/2007                               DISCHARGE SUMMARY   CONSULTING PHYSICIANS:  Charlynne Pander, D.D.S. and Lucky Cowboy, M.D.   ADMISSION DIAGNOSES:  1. Peripheral vascular disease with left lower extremity claudication.  2. Coronary artery disease status post PTCA, distal circumflex artery      November 2007.  3. Dyslipidemia.  4. Tobacco abuse.  5. Anxiety.   COURSE IN HOSPITAL:  The patient was admitted electively to [facility] on December 28, 2006 for right to left femoral femoral bypass.  Operative procedure was carried out on day of admission.  This was an  uneventful operative procedure.   During intubation the patient, however, suffered a dislodged tooth which  was aspirated into the stomach.  She required direct laryngoscopy and  esophagoscopy carried out by Dr. Lucky Cowboy.  X-ray verified the tooth  to be in the stomach.   During admission consultation was obtained from Dr. Verdie Shire with the  dental department.  This revealed severely poor dentition.  Recommendation was completion extraction of her teeth.  She underwent  extraction of remaining teeth with 4 quadrant alveoloplasty and  bilateral mandibular lingual tori reductions carried out on December 31, 2006, by Dr. Verdie Shire.   The patient recovered well from her operative procedures.  No major  complications.  She was discharged home in good condition.   DISCHARGE MEDICATIONS:  1. Prilosec OTC 1 tablet daily.  2. Plavix 75 mg daily.  3. Carvedilol 25 mg b.i.d.  4. Simvastatin 80 mg q.h.s.  5. Aspirin 81 mg daily.  6. Benadryl 50 mg p.r.n.  7. Oxycodone 5 mg 1 to 2 tablets every 4 hours p.r.n.  8.  Keflex 500 mg every 6 hours x7 days.  9. Ferrous sulfate 325 mg daily.   FINAL DIAGNOSES:  1. Peripheral vascular disease with left lower extremity claudication      secondary to left common iliac artery occlusion.  2. Poor dentition, status post completion extractions.   FOLLOWUP:  The patient will be seen in the office for routine scheduled  followup.  The patient was instructed regarding local wound care.  Call  the office if any problems.      Balinda Quails, M.D.  Electronically Signed     PGH/MEDQ  D:  01/08/2007  T:  01/09/2007  Job:  [id number]   cc:   Charlynne Pander, D.D.S.  Lucky Cowboy, MD

## 2010-10-29 NOTE — Assessment & Plan Note (Signed)
OFFICE VISIT   Kelsey Hansen, Kelsey Hansen  DOB:  19-Feb-1966                                       04/15/2007  CHART#:[id number]   Kelsey Hansen is a 45 year old vasculopath with known coronary artery disease  and aortoiliac occlusive disease.  She had previously undergone right  common iliac stent and right external iliac PTA in May 2008.  She has  subsequently had a right-to-left femoral-femoral bypass for occluded  left common iliac artery.   She underwent surveillance duplex evaluation revealing a drop in her  ankle-brachial indices bilaterally with elevated velocity noted in the  donor right external iliac artery at 395 cm per second.  ABIs have  dropped down to 0.62 on the right and 0.58 on the left from previous  values of 0.86 and 0.79 respectively.  Further evaluation by duplex  reveals her fem-fem graft to be patent, however there are elevated  velocities noted in the proximal right femoral anastomosis and right  common femoral artery.   Kelsey Hansen does complain of some increasing claudication symptoms  bilaterally.   Her blood pressure is 109/60, pulse 58 per minute, regular, respirations  16 per minute.   The right femoral pulse is 1+ with a bruit.  Fem-fem graft patent.  No  palpable lower extremity pulses below the femorals bilaterally.   Kelsey Hansen does have an at risk right-to-left femoral-femoral graft secondary  to restenosis of the right iliac donor side.  We will plan to go ahead  with a diagnostic arteriogram and further percutaneous intervention as  indicated on May 05, 2007 at [facility].   Balinda Quails, M.D.  Electronically Signed   PGH/MEDQ  D:  04/15/2007  T:  04/16/2007  Job:  [id number]   cc:   Learta Codding, MD,FACC

## 2010-10-29 NOTE — Op Note (Signed)
Kelsey Hansen, Kelsey Hansen NO.:  [id number]   MEDICAL RECORD NO.:  [id number]          PATIENT TYPE:  INP   LOCATION:  2031                         FACILITY:  [facility]   PHYSICIAN:  Balinda Quails, M.D.    DATE OF BIRTH:  [birth year]   DATE OF PROCEDURE:  DATE OF DISCHARGE:                               OPERATIVE REPORT   PREOPERATIVE DIAGNOSIS:  Left lower extremity claudication.   POSTOPERATIVE DIAGNOSIS:  Left lower extremity claudication.   PROCEDURE:  Right-to-left femoral-femoral bypass with 7-mm Dacron  Hemashield graft.   SURGEON:  P Bud Face, MD   ASSISTANT:  Jerilee Field, MD  Jerold Coombe, P.A.   ANESTHETIC:  General endotracheal.   ANESTHESIOLOGIST:  Dr. Jacklynn Bue.   COMPLICATIONS:  Dislodged tooth in stomach.   CLINICAL NOTE:  Kelsey Hansen is a 45 year old female with peripheral  vascular disease and coronary artery disease.  She has previously  undergone PTCA for an acute MI.  She has severe limiting left lower  extremity claudication.  She is had a right common iliac stent placed  and an attempt at recanalization of her left common iliac occlusion was  unsuccessful.   She is brought to the operating at this time for planned right-to-left  femoral-femoral bypass.   During intubation.  The patient had a dislodged tooth.  Investigation of  this ultimately revealed it to be in the stomach.   OPERATIVE PROCEDURE:  The patient brought to the operating room in  stable condition.  Placed in supine position.  General endotracheal  anesthesia induced.  During induction of general endotracheal  anesthesia, tooth was dislodged.  This was worked up including  laryngoscopy and bronchoscopy and ultimately X-rays show the tooth to be  in the stomach.   With general endotracheal anesthesia induced, the abdomen and both legs  were prepped and draped in sterile fashion.   Matched longitudinal groin incisions were made at the inguinal ligament.  Dissection carried through subcutaneous tissue and lymphatics with  electrocautery.  The common femoral artery exposed at the inguinal  ligament, dissected out and encircled with vessel loop.  Distal  dissection carried down to the origin of the profunda and superficial  femoral arteries which were each encircled with vessel loops.  A 2+  right femoral pulse present.  No palpable left femoral pulse.  Moderate  posterior plaque present bilaterally in the common femoral artery.   A suprapubic tunnel made between the two incisions.  A 7-mm Dacron  Hemashield graft placed through the tunnel.  The patient administered  5000 units heparin intravenously.   The femoral vessels were controlled bilaterally with a Henley clamp  proximally.  The profunda and superficial femoral artery controlled with  bulldog clamps.  Longitudinal arteriotomy made in the common femoral  level bilaterally.   The Hemashield graft was beveled and anastomosed end-to-side to the  common femoral artery bilaterally with running 6-0 Prolene suture.  At  completion of the anastomoses of the graft was flushed.  Each of the  femoral vessels were flushed.  Clamps were removed directing flow down  the left leg.  Excellent flow was present.  Adequate hemostasis  obtained.  Sponge and instrument counts correct.  The patient  administered 50 mg protamine intravenously.  The groin incisions  irrigated with saline solution.   Groin incisions then closed running 2-0 Vicryl suture in deep  subcutaneous layer.  Running 3-0 Vicryl sutures superficial subcutaneous  layer.  Skin closed 4-0 Monocryl.  Dermabond applied.  Sterile dressing  applied.  No apparent complications.  The patient transferred to  recovery in stable condition.      Balinda Quails, M.D.  Electronically Signed     PGH/MEDQ  D:  12/28/2006  T:  12/29/2006  Job:  [id number]

## 2010-10-29 NOTE — Op Note (Signed)
NAME:  Kelsey Hansen, Kelsey Hansen                 ACCOUNT NO.:  [id number]   MEDICAL RECORD NO.:  [id number]          PATIENT TYPE:  AMB   LOCATION:  SDS                          FACILITY:  [facility]   PHYSICIAN:  Balinda Quails, M.D.    DATE OF BIRTH:  1965-07-13   DATE OF PROCEDURE:  11/10/2006  DATE OF DISCHARGE:                               OPERATIVE REPORT   PHYSICIAN:  Balinda Quails, M.D.   DIAGNOSIS:  Bilateral lower extremity claudication secondary to  aortoiliac occlusive disease.   PROCEDURE:  1. Abdominal aortogram with bilateral lower extremity runoff      arteriogram.  2. Attempted recanalization left common and external iliac arteries.  3. Right common iliac percutaneous transluminal angiography/stent.  4. Right external iliac percutaneous transluminal angiography.   ACCESS:  Right common femoral artery 6-French sheath.   CONTRAST:  150 mL Visipaque.   COMPLICATIONS:  None apparent.   PROCEDURE NOTE:  The patient was brought to the cath lab in stable  condition.  She was placed in a supine position.  Both groins were  prepped and draped in a sterile fashion.  The skin and subcutaneous  tissue of the right groin was instilled with 1% Xylocaine.  The patient  was administered 1 mg of Versed and 50 mcg fentanyl intravenously.   The right groin was opened with an 11 blade.  The needle was introduced  in the right common femoral artery.  A 0.035 Magic torque guidewire was  advanced through the needle.  Under fluoroscopy, this was advanced up  into the pelvis, however, it could not be passed into the abdominal  aorta.  The needle was removed, a 5-French sheath advanced over the  guidewire, the dilator removed, the sheath flushed with heparin saline  solution.   A retrograde right femoral arteriogram was then obtained.  This revealed  irregular atherosclerotic plaque and right common iliac artery, a patent  right hypogastric artery and external iliac artery.  Occlusion of the  left  common iliac artery just beyond its origin.  Small infrarenal  aorta.   With the assistance of fluoroscopy and torquing of the Magic torque  wire, the wire was passed up into the abdominal aorta.  A standard AP  abdominal aortogram was obtained with a pigtail catheter.  This revealed  single widely patent bilateral renal arteries.  The infrarenal aorta was  patent although it did taper distally secondary to plaque.  No dominant  stenosis present in the infrarenal aorta.  Large collateral vessels were  noted arising from the lumbars and the inferior mesenteric artery was  patent providing flow into the pelvis.  The right common iliac artery  revealed an area of irregular stenosis along with heavy plaque distally.  The catheter appeared to be occlusive and the patient was administered  2000 units of heparin intravenously.   The catheter was then drawn down to the aortic bifurcation and an AP  pelvic arteriogram was obtained.  This revealed occlusion of the left  common iliac artery, external iliac, and hypogastric arteries.  There  was retrograde flow in  the left common iliac artery up in the left  external iliac artery via pelvic collaterals.  A large inferior  mesenteric artery provided pelvic collaterals.  The right common iliac  artery revealed irregular stenosis and a high grade stenosis just  proximal to the origin of the hypogastric.  The right external iliac  artery appeared to be widely patent.   Bilateral lower extremity runoff arteriography was then obtained.  The  right leg revealed patent common femoral, profunda and superficial  femoral arteries.  There was three vessel tibial runoff.  The left leg  revealed reconstitution of the left common femoral artery with  retrograde flow up into the distal left external iliac artery.  The left  superficial femoral, profunda and popliteal arteries were patent.  There  was three vessel tibial runoff in the left calf.   The guide-wire  was reinserted and an IMA catheter inserted over the  guidewire and engaged into the remaining small area of patency of the  left common femoral artery.  Using a subintimal dissection technique  with a soft glide wire, the wire was passed down into the left common  iliac artery.  The IMA catheter was then removed, the right femoral  sheath removed, 6-French sheath advanced over the guidewire.  An IMA  guide catheter was then advanced over the guidewire and engaged into the  area of origin of the left common iliac artery.  An Indole catheter was  then advanced through the guide catheter and over the guidewire in the  left common iliac artery.  Using the subintimal dissection technique,  the catheter was then placed over guidewire down into the left external  iliac artery.  Several attempts were made to engage the catheter  intraluminally into the left common femoral artery.  However, the  patient suffered a considerable amount of pain with this subintimal  dissection technique.  Therefore, the technique was ultimately  discontinued.   Attention was then placed on the right iliac system.  This did reveal  irregular plaque proximally in the common iliac artery.  This extended  down to the area of origin of the right hypogastric and external iliac  artery.  Using a catheter pullback technique for pressure, the gradient  across this was 30 mmHg from the terminal aorta to the external iliac  artery.  There was no nitroglycerin injection and this was a gradient  without distal vasodilatation.   In an attempt to avoid a major aortobifemoral bypass in this young  patient with coronary disease, the right common iliac artery was dilated  with a 6 x 4 Powerflex balloon.  Prior to dilatation, the patient  received an additional 2000 units of heparin.  The ACT measured 273  seconds.  The right common iliac dilatation was carried out with a 6 x 4 Powerflex at 10 atmospheres x30 seconds.  At completion  of this, a  retrograde arteriogram was obtained.  This verified an area of  dissection and suboptimal result in the right common iliac artery.  The  dissection did extend down to the origin of the right external iliac  artery.  The decision was made to avoid jailing the right hypogastric  artery as this was the only remaining hypogastric artery in this young  patient.  Therefore, an 8 x 40 Smart stent was deployed from the  terminal aorta down to the termination of the right common iliac artery.  At completion of this, further retrograde arteriogram was obtained.  This did  continue to reveal an area of hyperlucency and possible  dissection at the origin of the right external iliac artery.  A repeat  pressure gradient across this revealed this to be improved, however was  still approximately 20 mmHg.  Therefore, the 6 x 4 Powerflex balloon was  advanced up to the distal margin of the stent and out across the origin  of the right external iliac artery.  This was inflated at 4 atmospheres  for 2 minutes.  Upon completion of this, arteriogram was obtained.  This  revealed improvement in the area of hyperlucency and apparent  dissection.  A final pressure gradient measured across this revealed it  to be less than 10 mmHg.   This completed the interventional procedure.  The patient did receive an  additional 2000 units of heparin for a total of 6000 units.  Final ACT  measured 221 seconds.  The Indole catheter was then removed.  The  patient was transferred to the holding area.   FINAL IMPRESSION:  1. Single widely patent bilateral renal arteries.  2. Aortoiliac occlusive disease with bilateral claudication, left      common and external iliac occlusion.  A long segment moderate      irregular stenosis of the right common iliac artery.  3. Right common iliac PTA and stenting with successful technical      result.  4. Right external iliac PTA with successful outcome.  5. Reduced pressure  gradient across the right common iliac artery.   DISPOSITION:  These results have been reviewed with the patient and the  family.  The patient will be scheduled for elective right-to-left  femoral/femoral bypass for relief of claudication symptoms.      Balinda Quails, M.D.  Electronically Signed     PGH/MEDQ  D:  11/10/2006  T:  11/10/2006  Job:  [id number]

## 2010-10-29 NOTE — Assessment & Plan Note (Signed)
[facility]                          [facility] OFFICE NOTE   Kelsey Hansen, Kelsey Hansen                        MRN:          [id number]  DATE:07/16/2007                            DOB:          1965-07-11    HISTORY OF PRESENT ILLNESS:  The patient is a 45 year old female with a  history of coronary artery status post circumflex PTCA care at Dr.  Samule Ohm in November 2007.  The patient is also status post right to left  femoral-femoral bypass in July 2008.  More recently she underwent a  revascularization procedure by Dr. Madilyn Fireman which involved PTA and stent of  the right external iliac artery secondary to stenosis.  She was found to  have patent right to left femoral-femoral bypass and had intact  infrainguinal runoff bilaterally with single-vessel right posterior  tibial artery occlusion.  Post procedure.  The patient has done well  with improvement in her claudication.  However, she has had difficulty  with wound healing on both groins.  She is not supposed to do any heavy  lifting or running for the next 6 months.  She tells me today, however  that her groin incisions are well-healed and have no evidence of  drainage.  The patient denies shortness of breath at rest.  She does  report typical symptoms of chronic stable angina with occurred during  mopping in sleeping.  She occasionally takes nitroglycerin frequency has  not increased that.  Nitro does rapidly resolve her symptoms and she  also has noticed significant improvement in the frequency and duration  of her angina after Imdur was added to her medical regimen.   MEDICATIONS:  Aspirin 81 mg a day, simvastatin 80 p.o. daily, Plavix 75  mg daily.  Rendine 150 p.o. q.h.s., iron daily, isosorbide 30 mA b.i.d.  carvedilol 25 mg 1/2 p.o. b.i.d.   PHYSICAL EXAMINATION:  VITAL SIGNS:  Blood pressure 124/62, heart rate  is 62, weight 139 pounds.  Neck exam normal carotid stroke no carotid bruits.  LUNGS:  Clear breath sounds bilaterally.  HEART:  Regular rate and rhythm.  Normal sinus to no murmurs, gallops.  ABDOMEN:  Soft, nontender rebound or guarding.  Good bowel sounds.  Extremity exam no cyanosis, clubbing or edema.  Pulses are palpable  bilaterally.  NEURO:  The patient is alert and nonfocal.   PROBLEM LIST:  1. Multivessel coronary artery disease.  Status post non-ST elevation      myocardial infarction.  PCI in the distal circumflex artery      November 2007.  Preserved LV function and negative stress adenosine      Cardiolite study with ejection fraction of 62 in 2008.  2. Severe peripheral vascular disease.  Status post right left bypass      graft July 2008.  Status post PTA and stent by Dr. Madilyn Fireman involve      the right external iliac artery.  3. Blood pressure well-controlled.  4. Next dyslipidemia.  5. Next ongoing tobacco use.  6. Next gastroesophageal reflux disease.  7. Sinus bradycardia.   PLAN:  1. The patient  is doing well from a cardiovascular perspective does      have chronic stable angina and we did increase her Imdur to 90 mg      p.o. daily.  2. The patient will have additional laboratory work done.  She      describes numbness on both thighs.  Will make sure that she is not      and glucose intolerant hemoglobin A1c and fasting blood sugar has      been ordered.  3. Refill the patient's prescriptions and I have stressed the      importance of continuing statin drug therapy as well as Plavix a      lot and has been lifelong.     Learta Codding, MD,FACC  Electronically Signed    GED/MedQ  DD: 07/16/2007  DT: 07/16/2007  Job #: [id number]   cc:   Balinda Quails, M.D.

## 2010-10-29 NOTE — Procedures (Signed)
BYPASS GRAFT EVALUATION   INDICATION:  Followup bilateral lower extremity PVD   HISTORY:  Diabetes:  No.  Cardiac:  MI.  Hypertension:  No.  Smoking:  Quit.  Previous Surgery:  Right CIA and EIA PTA/stent on 11/09/2005.  Fem-fem  (right-to-left) bypass graft 12/28/2006.  Both by Dr. Madilyn Fireman.   SINGLE LEVEL ARTERIAL EXAM                               RIGHT              LEFT  Brachial:                    110                113  Anterior tibial:             68 mono            62 mono  Posterior tibial:            70 mono            65 mono  Peroneal:  Ankle/brachial index:        0.62               0.58   PREVIOUS ABI:  Date:  01/28/2007  RIGHT:  0.86  LEFT:  0.79   LOWER EXTREMITY BYPASS GRAFT DUPLEX EXAM:  See attached sheet for  velocities.   DUPLEX:  Stenotic waveforms in inflow and proximal anastomosis, then  monophasic within and distal to graft.   IMPRESSION:  Ankle-brachial indices show significant decrease from  previous study.   The fem-fem bypass graft appears patent with monophasic waveforms,  however, elevated velocities in the proximal anastomosis of 307 cm/s,  and inflow velocities of 512 cm/s in the right common femoral artery,  suggestive of  >75% stenosis.   Appointment to see Dr. Madilyn Fireman 04/15/2007.   ___________________________________________  P. Liliane Bade, M.D.   AS/MEDQ  D:  04/12/2007  T:  04/13/2007  Job:  [id number]

## 2010-10-29 NOTE — Assessment & Plan Note (Signed)
OFFICE VISIT   Kelsey Hansen, Kelsey Hansen  DOB:  March 10, 1966  CHART#:[id number]   HISTORY OF PRESENT ILLNESS:  Ms. [facility] underwent femoral femoral bypass  grafting by Dr. Madilyn Fireman on December 28, 2006.  She returns today with inguinal  wound problems.  She has had separation of the right inguinal wound  since shortly after discharge and has  been on Keflex at home.  She has  been having dressing changes by her mother.  She complains of some pain  but no chills and fever.   PHYSICAL EXAMINATION:  VITAL SIGNS:  Temperature 98, blood pressure  141/70, heart rate 53.  GENERAL:  There is significant separation of the right inguinal wound  but no exposure of the graft.  The wound is separated at about 2 cm with  some fibrinous exudate.  It is not ready to breath at this point.  The  left groin is essentially healed with one area distally which was opened  slightly, and some suture material removed from both sides.  There is no  evidence of deep infection on either side.  She has good graft pulls.  She will begin Keflex 500 mg t.i.d. for two weeks.  Local wound care  twice daily by the Home Health nurse and the mother and return to see  Dr.  Durwin Nora next week in Dr. Madilyn Fireman absence for further wound examination.   Quita Skye Hart [place], M.D.  Electronically Signed   JDL/MEDQ  D:  01/12/2007  T:  01/13/2007  Job:  [id number]

## 2010-10-29 NOTE — H&P (Signed)
HISTORY AND PHYSICAL EXAMINATION   December 10, 2006   Re:  Kelsey Hansen                 DOB:  December 24, 1965   DIAGNOSIS:  Left lower extremity claudication.   HISTORY:  Kelsey Hansen is a 45 year old female with a history of known  atherosclerotic vascular disease.  She suffered a myocardial infarction  due to circumflex coronary disease in November of 2007.  At that time  she was found to have aortoiliac occlusive disease.  She has recovered  now from her myocardial infarction and further workup of her peripheral  vascular disease revealed bilateral aortoiliac occlusive disease.  She  had a chronic total occlusion of her left common and external iliac  arteries.  Attempt at percutaneous recannulization of this was  unsuccessful.  She has undergone right common iliac PTA and stenting and  right external iliac PTA.   She presents today for a followup visit after her interventional  procedure which was carried out on Nov 10, 2006.  She is no longer  smoking.   She has had relief of symptoms in her right lower extremity with recent  intervention.  She, however, continues to claudicate in her left lower  extremity.   PAST MEDICAL HISTORY:  1. Coronary artery disease status post circumflex distribution      myocardial infarction November, 2007, status post PTCA distal      circumflex coronary artery.  2. Aortoiliac occlusive disease status post right common iliac PTA and      stenting and right external iliac PTA.  3. Dyslipidemia.  4. History of tobacco abuse.  5. Anxiety.   PAST SURGICAL HISTORY:  Right brachial thrombectomy and right brachial  axillary sheath hematoma November, 2007.   MEDICATIONS:  1. Plavix 75 mg daily.  2. Aspirin 81 mg daily.  3. Coreg 25 mg b.i.d.  4. Simvastatin 80 mg daily.  5. Benadryl 25 mg p.r.n.  6. Prilosec 20 mg 1-2 tablets daily.   ALLERGIES:  None known.   SOCIAL HISTORY:  1. She lives alone in [place].  2. She is no  longer using tobacco products, discontinued in November,      2007.  3. She does not consume alcohol.  4. She has no children.  5. 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   FAMILY HISTORY:  1. This is significant for premature coronary disease.  2. Her mother died at age 30 with a history of lung CA and heart      disease.  3. Her father is living at age 62.   ALLERGIES:  SHE IS INTOLERANT OF:  1. PERCOCET.  2. LORTAB.   REVIEW OF SYSTEMS:  She suffers from nervousness and anxiety.  She has  some weight loss and anorexia.  Shortness of breath with exertion and  chest tightness.  Productive cough and wheezing.  Chronic reflux  symptoms.  Pain in her legs with walking.   PHYSICAL EXAMINATION:  GENERAL:  A 45 year old female, appears  approximately her stated age.  Poor dentition.  Alert and oriented.  VITAL SIGNS:  BP 140/86, pulse 75 per minute, respirations were 16 per  minute.  HEENT:  Poor dentition.  Normocephalic.  Extraocular movements intact.  NECK:  Supple, no thyromegaly or adenopathy.  CARDIOVASCULAR:  No carotid bruits.  Normal heart sounds without  murmurs.  CHEST:  Equal air entry  bilaterally without rales or rhonchi.  ABDOMEN:  Soft and nontender.  No masses or organomegaly.  LOWER EXTREMITIES:  Right femoral pulse 2+.  Absent left femoral pulse.  Dorsalis pedis posterior tibia pulses 1+.  Absent left popliteal,  posterior tibia and dorsalis pedis pulses.  No ankle edema.  NEUROLOGIC:  Patient is alert and oriented.  Moves all extremities to  command.  Extraocular movements intact.   IMPRESSION:  1. Peripheral vascular disease with left common iliac artery occlusion      and left lower extremity claudication.  2. Coronary artery disease status post myocardial infarction with      distal circumflex angioplasty.  3. History of tobacco abuse.  4. Anxiety.  5. Dyslipidemia.   RECOMMENDATION:  Admit to hospital for  right-to-left femoral-femoral  bypass for relief of claudication.  Procedure explained to the patient.  She consents for planned procedure.  Potential risks including major  complications such as MI, stroke discussed with the patient.  Local  complications including bleeding, infection and distal embolization also  discussed.   Balinda Quails, M.D.  Electronically Signed   PGH/MEDQ  D:  12/10/2006  T:  12/11/2006  Job:  [id number]

## 2010-10-29 NOTE — Op Note (Signed)
NAME:  Kelsey Hansen, Kelsey Hansen NO.:  [id number]   MEDICAL RECORD NO.:  [id number]          PATIENT TYPE:  INP   LOCATION:  2031                         FACILITY:  [facility]   PHYSICIAN:  Lucky Cowboy, MD         DATE OF BIRTH:  12/30/1965   DATE OF PROCEDURE:  12/28/2006  DATE OF DISCHARGE:  01/02/2007                               OPERATIVE REPORT   PREOPERATIVE DIAGNOSIS:  Tooth in the airway.   POSTOPERATIVE DIAGNOSIS:  Tooth in the stomach.   PROCEDURE:  Direct laryngoscopy and esophagoscopy.   SURGEON:  Dr. Lucky Cowboy.   ANESTHESIA:  General endotracheal anesthesia.   ESTIMATED BLOOD LOSS:  None.   COMPLICATIONS:  None.   INDICATIONS:  The patient is a 45 year old female who was in the  operating room after traumatic intubation due to a difficult to airway  situation.  She is being prepared for vascular surgery by Dr. Madilyn Fireman.  During the intubation, a tooth was dislodged and went into the airway.  There was some concern that it is in the larynx or bronchial tree.  For  this reason, direct laryngoscopy was performed.  The Dedo laryngoscope  was used to evaluate the endolarynx with some difficulty due to the  patient's very anterior anatomy and small mouth with projected upper  front teeth.  Although there was significant edema and some blood in the  posterior pharyngeal airway space, there was no tooth noted in the  larynx.  Esophagoscopy with the rigid cervical esophagoscope was then  performed.  There was no tooth evaluated.  At this point, an x-ray was  performed which revealed the tooth to be in the stomach.  At this point,  the patient remained on the table for planned vascular surgery.      Lucky Cowboy, MD  Electronically Signed     SJ/MEDQ  D:  03/18/2007  T:  03/19/2007  Job:  [id number]   cc:   [facility]

## 2010-11-01 NOTE — Assessment & Plan Note (Signed)
[facility]                            CARDIOLOGY OFFICE NOTE   Kelsey Hansen, Kelsey Hansen                        MRN:          [id number]  DATE:06/18/2006                            DOB:          Nov 01, 1965    HISTORY OF PRESENT ILLNESS:  Kelsey Hansen is a 45 year old woman who  suffered non-ST-elevation myocardial infarction on November 28.  She was  found to have 90% stenosis in the AV groove circumflex.  The procedure  had to be done from her right brachial access due to severe iliac  disease.  It was complicated by brachial artery occlusion, which was  repaired by Dr. Madilyn Fireman.  She then developed a right arm deep vein  thrombosis, for which she was again hospitalized.  She has recovered  well from this.  She has not had any recurrent chest discomfort, PND,  orthopnea, or edema.  She has not had any palpitations or syncope.  Her  arm does remain sore around the incision site above the antecubital  fossa.  However, the swelling has completely resolved and she has normal  motor and sensory function.   CURRENT MEDICATIONS:  1. Plavix 75 mg per day.  2. Aspirin 81 mg per day.  3. Lipitor 80 mg per day.  4. Coumadin as managed by the Coumadin clinic in [place].  5. Carvedilol 12.5 mg twice per day.  6. She takes alprazolam on a p.r.n. basis.   PHYSICAL EXAMINATION:  She is generally well-appearing in no distress.  Heart rate 64, blood pressure 100/62.  She has no jugular venous distension.  No thyromegaly.  LUNGS:  Clear to auscultation.  She has a nondisplaced point of maximal cardiac impulse.  There is a  regular rate and rhythm without murmur, rub, or gallop.  ABDOMEN:  Soft, nondistended, nontender.  There is no  hepatosplenomegaly.  Bowel sounds are normal.  The right arm is free of edema.  Bilateral radial pulses are 2+.  Carotid pulses are 2+ bilaterally without bruit.   IMPRESSION/RECOMMENDATIONS:  1. Right arm deep vein thrombosis:  Plan on  continuing anticoagulation      for a total of 3 months.  2. Atherosclerotic coronary disease status post non-ST-elevation      myocardial infarction:  She has an 80% stenosis of the proximal      right coronary artery.  We will plan on medical management due to a      very small diameter of the vessel.  Ejection fraction is 55%.  3. Lower extremity peripheral arterial disease:  Severe right common      iliac artery stenosis and occluded left common iliac artery.  These      are asymptomatic.  Manage conservatively.  4. Tobacco abuse.  I applauded her on remaining off of tobacco.  5. Hypercholesterolemia:  Continue Statin.   I will make no changes in her medications and plan on seeing her back in  6 weeks' time.  At that time, we will reassess left ventricular systolic  function with echo.     Salvadore Farber, MD  Electronically Signed  WED/MedQ  DD: 06/18/2006  DT: 06/18/2006  Job #: [id number]

## 2010-11-01 NOTE — Discharge Summary (Signed)
NAME:  Kelsey Hansen, Kelsey Hansen NO.:  [id number]   MEDICAL RECORD NO.:  [id number]          PATIENT TYPE:  INP   LOCATION:  3701                         FACILITY:  [facility]   PHYSICIAN:  Gerrit Friends. Dietrich Pates, MD, FACCDATE OF BIRTH:  July 25, 1965   DATE OF ADMISSION:  05/27/2006  DATE OF DISCHARGE:  05/31/2006                               DISCHARGE SUMMARY   PROCEDURES:  None.   TIME OF DISCHARGE:  Thirty five minutes.   PRIMARY DIAGNOSIS:  Right upper extremity deep vein thrombosis.   SECONDARY DIAGNOSES:  1. Non-ST segment elevation myocardial infarction in November 2007      with percutaneous transluminal coronary angioplasty to the      circumflex, medical therapy for residual coronary artery disease of      50-80% in the first diagonal, obtuse marginal #2 and right coronary      artery.  2. Preserved left ventricular function with an EF of 55% at      catheterization.  3. Asymptomatic peripheral arterial disease with right common iliac      80%, left common iliac 100%.  4. History of right brachial thrombus after right brachial approach at      catheterization, status post thrombectomy.  5. Treated dyslipidemia.  6. Tobacco abuse, quit November 2007.  7. Anxiety.  8. History of chest pain, anticoagulation with Coumadin started this      admission.   HOSPITAL COURSE:  Kelsey Hansen is a 45 year old female who was recently  diagnosed with coronary artery disease.  She was in the hospital from  November 28 through May 16, 2006 for a non-ST segment elevation MI  treated with PTCA.  Residual coronary artery disease is best treated  medically.  Post cath she had a right brachial thrombus and Dr. Madilyn Fireman  performed a thrombectomy.  She was seen in followup in the office on  May 27, 2006 and noted to have right upper extremity swelling and  pain.  She had an upper extremity Doppler which showed a DVT and she was  admitted for further evaluation and anticoagulation.   Kelsey Hansen was treated with IV heparin and transitioned to Coumadin.  Her  Coumadin level climbed slowly but by discharge she had been therapeutic  for greater than 24 hours so it was considered acceptable to stop the  heparin.  Of note, her potassium was slightly low at 3.4 and this will  be supplemented.  She is not on a diuretic.  Of note, her blood sugar  was slightly elevated on admission at 114 but a previous hemoglobin A1c  had been 6.1.  She is encouraged to follow a Heart Healthy diet and  obtain a primary care physician so this can be followed more closely.  In November 2007 a lipid profile showed an LDL of 166 so she was started  on Lipitor 80 mg a day.  She currently has the medication but may have  trouble affording in the future.  Cost is a significant issue for her  medications and she may have to be switched to Pravachol 40 which can  be  obtained at [facility].  Zocor can only be obtained at [facility].   Kelsey Hansen' arm improved slowly but steadily.  The edema improved and as  the edema improved the pain and soreness improved as well.  Additionally, she was having some problems with anxiety which she states  is an ongoing problem.  She feels that she is anxious most of the time  and has been taking p.r.n. Xanax for this.  She also quit smoking during  her last admission.  She does not currently have a primary care  physician but she is encouraged to find one in her area so she can get  more effective treatment for her anxiety.   Because of her peripheral vascular disease and recent balloon  angioplasty, Dr. Samule Ohm recommended that Kelsey Hansen remain on aspirin,  Plavix and Coumadin.  The aspirin was decreased to 81 mg.  She is to  call our office for any issues with bleeding.  She did have one  nosebleed prior to admission but she stated it was brief and did not  last long.  She is encouraged to use saline nasal spray two or three  times a day.   By May 31, 2006, Kelsey Hansen  was doing better and considered stable  for discharge with outpatient followup arranged.   DISCHARGE MEDICATIONS:  1. Plavix 75 mg daily.  2. Coreg 6.25 mg b.i.d.  3. Lipitor 80 mg daily.  4. Coumadin 5 mg 1-1/2 tablets daily for 3 days and then as directed.  5. Lo/Ovral birth control pills daily.  6. Aspirin 81 mg daily.      Theodore Demark, PA-C      Gerrit Friends. Dietrich Pates, MD, [facility]  Electronically Signed    RB/MEDQ  D:  05/31/2006  T:  05/31/2006  Job:  [id number]

## 2010-11-01 NOTE — Discharge Summary (Signed)
Kelsey Hansen, Kelsey Hansen NO.:  [id number]   MEDICAL RECORD NO.:  [id number]          PATIENT TYPE:  INP   LOCATION:  4714                         FACILITY:  [facility]   PHYSICIAN:  Salvadore Farber, MD  DATE OF BIRTH:  12-14-1965   DATE OF ADMISSION:  05/13/2006  DATE OF DISCHARGE:  05/16/2006                               DISCHARGE SUMMARY   PRIMARY CARDIOLOGIST:  Dr. Randa Evens.   DISCHARGING DOCTOR:  Dr. Lewayne Bunting.   CONSULTING PHYSICIAN:  Dr. Madilyn Fireman, [facility].   PROCEDURES PERFORMED DURING HOSPITALIZATION:  1. Cardiac catheterization on May 13, 2006.  2. Right upper extremity arterial duplex study on May 13, 2006.      Thrombectomy of the right antecubital brachial thrombus per Dr.      Madilyn Fireman on May 13, 2006.   HISTORY OF PRIMARY DIAGNOSIS:  1. Non-ST elevated myocardial infarction status post cardiac      catheterization with substernal chest pain in the center of her      chest described as tightness and pressure associated with shortness      of breath, diaphoresis, nausea, and vomiting.  The patient was seen      at [facility] Emergency Room, sent for percutaneous transluminal      coronary angioplasty to circumflex.   SECONDARY DIAGNOSES:  1. Right antecubital hematoma with right brachial thrombus.  2. Tobacco abuse.  3. Hypercholesterolemia.  4. Peripheral vascular disease with left common iliac occlusion,      asymptomatic with conservative management.   HISTORY OF PRESENT ILLNESS:  This 45 year old Caucasian female with no  previous history of coronary artery disease was admitted from [facility]  Emergency Room after experiencing sudden onset of substernal chest pain  in the center of her chest described as tightness and pressure with  associated shortness of breath, diaphoresis, nausea, and vomiting.  After being seen at [facility] and found that her point-of-care  markers were elevated, the patient was transferred  emergently to [facility] and taken directly to the cath lab.  The patient had been  given nitroglycerin sublingual and IV heparin, as well as IV metoprolol.  The patient on arrival to the cath lab had continuation of pain and had  emergent catheterization.   Catheterization was completed by Dr. Randa Evens.  The patient had  difficult access to bilateral femoral arteries with 100% occlusion of  the left common iliac artery and 80% on the right.  The patient had a  right brachial access for cardiac catheterization.  The patient had  successful PTCA of a culprit lesion in the small circumflex, which was  90%.  The patient also had right coronary artery disease.  It was a  small diameter and the patient is to be treated medically.  Please see  Dr. Melinda Crutch thorough cardiac catheterization transcription for details.   Post cardiac catheterization, the patient was found to have a large  right upper arm hematoma with significant pain in the right upper  extremity.  The patient had pressure applied and a right upper extremity  arterial duplex study was completed.  There was no evidence of  pseudoaneurysm.  Brachial artery appeared to have good flow above the  puncture site, distally becomes monophasic with radial, valvular  monophasic, and axillary triphasic.  The patient was seen by Dr. Randa Evens after the study was completed and diagnosed probable occlusive  embolus in the right brachial artery.  Dr. Madilyn Fireman from [facility] was consulted  for evaluation and the patient was taken to OR for exploration.  The  patient had a brachial thrombectomy per Dr. Madilyn Fireman.   On May 14, 2006, the patient was recovering well.  Her right arm  continued to be sore.  She had no further complaints of chest pain or  dyspnea.  Vital signs were stable.  The patient was given smoking  cessation education and also information on low-cholesterol diet.  Distal pulses of the radial artery were 2+  bilaterally on evaluation  prior to discharge.  Distal pulses were also palpated; however, she did  have significant peripheral vascular disease per cardiac  catheterization.  She is to be treated medically at this time.   DISCHARGE LABORATORY DATA:  Hemoglobin 12.1, hematocrit 34.7, white  blood cells 19.3, platelets 344.  Sodium was 140, potassium 3.6,  chloride 107, CO2 24, glucose 129, BUN less than 1, creatinine 0.5.  Hemoglobin A1c 6.1.  Troponin originally 6.58.  Subsequent troponin  6.40, 6.23, and 6.15 respectively.  CK originally was 952 with  subsequent CK trending downward 559, 558, to 312.  Cholesterol 216,  triglycerides 124, HDL 25, LDL 166.  TSH 1.645.  Urine drug screen was  found to be positive for THC.   DISCHARGE VITAL SIGNS:  Blood pressure 151/93, pulse 66, respirations  20, temperature 97.0, O2 sat 97% on room air.   DISCHARGE APPOINTMENTS AND INSTRUCTIONS:  The patient is scheduled to  see Dr. Samule Ohm in 2 to 3 weeks.  The office is to call, as this is a  weekend.  A message was left with answering service.  The patient has  been given post cardiac catheterization discharge instructions, along  with instructions concerning that right brachial site for any recurrence  of hematoma, bleeding, infection, or excess swelling  signs.  The  patient was also given tobacco cessation instructions.  The patient will  be followed by Dr. Samule Ohm for further evaluation and medical treatment  of her peripheral vascular disease.   DISCHARGE MEDICATIONS:  1. Wellbutrin 150 mg 1 p.o. b.i.d.  2. Coreg 6.125 mg twice a day.  3. Plavix 75 mg once a day.  4. Lipitor 80 mg once a day at bedtime.  5. Aspirin 81 mg 1 p.o. daily.   ALLERGIES:  HYDROCODONE, LORTAB, VICODIN.   LENGTH OF DISCHARGE:  Greater than 35 minutes with nurse practitioner  and physician time.      Bettey Mare. Lyman Bishop, NP      Salvadore Farber, MD  Electronically Signed  KML/MEDQ  D:  05/16/2006  T:   05/17/2006  Job:  [id number]

## 2010-11-01 NOTE — Assessment & Plan Note (Signed)
[facility]                            CARDIOLOGY OFFICE NOTE   KYNZEE, DEVINNEY                        MRN:          [id number]  DATE:08/17/2006                            DOB:          11-12-1965    HISTORY OF PRESENT ILLNESS:  Ms. Laviolette is a 45 year old woman who  suffered a non-ST elevation myocardial infarction in late November,  2007.  I found her to have a 90% stenosis in the AV groove circumflex.  I treated this with bone angioplasty.  The procedure had to be done due  to right brachial access due to severe bilateral iliac disease.  Procedure was complicated by brachial artery occlusion which was  repaired by Dr. Madilyn Fireman.  She then developed a right arm deep venous  thrombosis for which she was again hospitalized.  She has been on 3  months of Coumadin without complication.  Her arm swelling has  completely resolved.  She does continue to have some pain in the upper  portion of that arm when she tries to use it.  She also feels it is  somewhat weak due to disuse.   She has had some chest discomfort, she has pain that involves her  central chest and radiates through her right neck to her right ear.  She  said it has been there constantly for 3 weeks.  There has been no time  that she has been pain free.  She says it is mild.  It is different than  that which accompanied her myocardial infarction.  It does not get worse  with exertion.  There is no associated dyspnea.   CURRENT MEDICATIONS:  1. Plavix 75 mg daily.  2. Aspirin 81 mg daily.  3. Lipitor 80 mg daily.  4. Alprazolam 0.25 mg p.r.n.  5. Coumadin as directed by our Coumadin clinic.  6. Over-the-counter Prilosec.  7. Carvedilol 12.5 mg twice per day.   PHYSICAL EXAMINATION:  She is generally well appearing, in no distress,  with heart rate 73, blood pressure 132/81 and weight of 134 pounds.  She  has no jugular venous distention, thyromegaly or lymphadenopathy.  Respiratory  effort is normal.  LUNGS:  Clear to auscultation.  She has a nondisplaced point of maximal  cardiac impulse.  There is a regular rate and rhythm without murmur,  rub, or gallop.  ABDOMEN:  Soft, nondistended, nontender.  There is no  hepatosplenomegaly.  Bowel sounds are normal.  The right arm is free of edema.  Radial pulse is 2+ as is the brachial  pulse.  Capillary refill is normal.  Carotid pulse is 2+ bilaterally  without bruit.   ELECTROCARDIOGRAM:  Demonstrates normal sinus rhythm with normal EKG.   IMPRESSION RECOMMENDATION:  1. Right arm deep venous thrombosis:  She has completed 3 months of      anticoagulation.  Will stop Coumadin now.  Coumadin clinic has been      notified.  2. Atherosclerotic coronary disease:  She is status post non-ST      elevation myocardial infarction.  There is an 80% stenosis in the  proximal right coronary which is a very small diameter vessel.      Managed medically.  Ejection fraction previously was 55%.  Will      recheck echocardiogram to assess now.  3. Lower extremity peripheral arterial disease:  Severe right common      iliac artery stenosis and occluded left common iliac.  These are      asymptomatic.  Will manage conservatively.  4. Tobacco abuse.  I prodded her on remaining off tobacco.  5. Hypercholesterolemia:  Will switch from atorvastatin to Zocor 80 to      save her money.  I have let her know that she can get this filled      at [facility] for $4.  Check lipids and LFTs at next visit.  6. Disposition:  See her back in 8 weeks' time.     Salvadore Farber, MD  Electronically Signed    WED/MedQ  DD: 08/17/2006  DT: 08/17/2006  Job #: [id number]   cc:   Balinda Quails, M.D.

## 2010-11-01 NOTE — H&P (Signed)
NAMEGRACELIN, WEISBERG NO.:  [id number]   MEDICAL RECORD NO.:  [id number]          PATIENT TYPE:  OIB   LOCATION:  2807                         FACILITY:  [facility]   PHYSICIAN:  Salvadore Farber, MD  DATE OF BIRTH:  1966-03-08   DATE OF ADMISSION:  05/13/2006  DATE OF DISCHARGE:                              HISTORY & PHYSICAL   PRIMARY CARDIOLOGIST:  New,  Dr. Samule Ohm.   CHIEF COMPLAINT:  Chest pain.   HISTORY OF PRESENT ILLNESS:  Ms. Lascala is a 45 year old female with no  previous history of coronary artery disease.  At 8 o'clock last p.m.,  [name] had the sudden onset of substernal chest pain in the center of  her chest that was described as a tightness and pressure.  This was  associated with shortness of breath, diaphoresis, nausea and vomiting.  This a.m., she went to [facility] emergency room where her point care  markers were elevated, and she was transferred urgently to [facility].  She was taken directly to the cath lab.  At [facility], she  was given sublingual nitroglycerin, IV heparin, as well as IV metoprolol  5 mg x3 doses.  Upon arrival to the cath lab, her pain is less than a  5/10, but still ongoing.  She has not had this pain before.  She has no  other current complaints.   PAST MEDICAL HISTORY:  She denies any history of diabetes, hypertension  or hyperlipidemia.  There is a family history of premature coronary  artery disease and ongoing tobacco/THC use.   PAST SURGICAL HISTORY:  None.   ALLERGIES:  She is intolerant or allergic to:  1. PERCOCET.  2. LORTAB.   CURRENT MEDICATIONS:  Birth control pills.   SOCIAL HISTORY:  She lives in [place], [place] alone.  She has ongoing  tobacco use with a 25 pack year history.  A urine drug screen was  positive for THC.  She denies EtOH abuse.   FAMILY HISTORY:  Positive for premature coronary artery disease.  No  further details available at this time.   REVIEW OF SYSTEMS:  She  has had diaphoresis, nausea and vomiting with  the chest pain.  She currently denies any fevers or chills.  She denies  abdominal pain, hematemesis, hemoptysis or melena.  Review of systems is  otherwise negative.   PHYSICAL EXAMINATION:  VITAL SIGNS:  Blood pressure initially at [facility] was 160/72 with a heart rate of 61, respiratory rate 14.  GENERAL:  She is a well-developed, well-nourished white female.  HEENT:  Her dentition is poor.  Her head is normocephalic and  atraumatic.  Sclerae are clear.  Nares without discharge.  NECK:  There is no lymphadenopathy, thyromegaly, JVD or carotid bruits  noted.  CV:  Her heart is regular in rate and rhythm with no significant murmur,  rub or gallop noted.  LUNGS:  Clear anteriorly.  ABDOMEN:  Soft and nontender.  EXTREMITIES:  There is no cyanosis or edema noted.  Distal pulses are  intact.  MUSCULOSKELETAL:  No  joint deformities or effusions are noted.  NEUROLOGIC:  She is alert and oriented.  Cranial nerves II-XII grossly  intact.   ELECTROCARDIOGRAM:  Sinus rhythm, rate 59, with hyperacute T-waves in V2  and 3.   LABORATORY VALUES:  Hemoglobin 14.2, hematocrit 41.4, WBC 16.6,  platelets 425.  Sodium 138, potassium 3.7, chloride 102, CO2 of 26, BUN  5, creatinine 0.5, glucose 161.  UDS positive only for THC.  Urine  pregnancy pending at the time of dictation.  Point of care markers -MB  greater than 80, troponin I of 3.88 and myoglobin greater than 500.   CHEST X-RAY:  Mild bronchitic changes with right base atelectasis.   IMPRESSION:  1. Acute myocardial infarction:  Proceeding directly to cardiac cath.      Further evaluation and treatment will depend on the results of the      cardiac catheterization.  2. Hyperglycemia:  Check hemoglobin A1c.  3. Leukocytosis:  She is afebrile and no infiltrate on chest x-ray;      follow, as this is likely secondary to her myocardial infarction.  4. Tobacco use:  Cessation strongly  advised, and will call a consult.  5. Unknown lipid status:  Empiric Lipitor 80 and check a lipid      profile.      Theodore Demark, PA-C      Salvadore Farber, MD  Electronically Signed    RB/MEDQ  D:  05/13/2006  T:  05/13/2006  Job:  [id number]

## 2010-11-01 NOTE — Cardiovascular Report (Signed)
NAME:  Kelsey Hansen, Kelsey Hansen NO.:  [id number]   MEDICAL RECORD NO.:  [id number]          PATIENT TYPE:  OIB   LOCATION:  2906                         FACILITY:  [facility]   PHYSICIAN:  Salvadore Farber, MD  DATE OF BIRTH:  03-16-1966   DATE OF PROCEDURE:  05/13/2006  DATE OF DISCHARGE:                            CARDIAC CATHETERIZATION   PROCEDURES:  Left heart catheterization, left ventriculography, coronary  angiography, balloon angioplasty of the distal circumflex, abdominal  aortography.   INDICATIONS:  Ms. Echevarria is a 45 year-old woman without prior history of  atherosclerotic disease.  She has a long smoking history.  She presented  to [facility] this morning with chest discomfort which began  approximately 6:00 p.m. last evening.  Pain radiates to both hands.  There are no associated symptoms.  Two electrocardiograms are normal.  However, troponin is 3.88 suggestive of acute coronary syndrome with  myocardial injury.  She was a transferred emergently after being treated  with aspirin, heparin, morphine, and beta blockade.  Despite these  efforts, she continues to have 5/10 substernal chest discomfort.  It was  thus clear that she was having a non-ST elevation myocardial infarction.  I recommended proceeding to emergent cardiac catheterization with an eye  to percutaneous revascularization.  The patient agreed.   PROCEDURAL TECHNIQUE:  Informed consent was obtained.  Under 1%  lidocaine local anesthesia, I attempted to gain access in first the  right femoral artery.  The patient has a substantial thickening of her  skin with scar tissue in both groins.  Femoral pulses were not palpable.  It was unclear to me whether this represented difficult feeling due to  the scar tissue versus vascular insufficiency.  The patient denied any  claudication.  We therefore made effort to get into the right groin  using a Smart needle.  I was unable to obtain access into the  femoral  artery.  I then gained access into the left common femoral artery using  a Smart needle in the modified Seldinger technique.  A Wholey wire would  not pass beyond the common iliac artery, however.  I advanced a JR-4  diagnostic catheter into the mid portion of the common iliac.  Angiography demonstrated occlusion of the iliac.   Unable to obtain access from the legs, we then turned to the arm.  The  right brachial, radial and ulnar arteries all had 2+ pulses that were  quite small to palpation.  Using a micropuncture set, I obtained access  to the right brachial artery using the modified Seldinger technique.  I  then advanced a Wholey wire and exchanged for a 6-French brachial  sheath.  The patient's ACT was 201.  Intra-arterial nitroglycerin was  then administered.  I then performed coronary angiography using a JR-4  catheter for the native right coronary and a AL-2 catheter for the left  main coronary artery.  I then performed left heart catheterization and  ventriculography using a pigtail catheter.  Pigtail catheter was then  redirected into the descending aorta and positioned in the suprarenal  abdominal aorta.  Abdominal aortography was performed by power  injection.   Coronary images demonstrated diffusely diseased coronary arteries which  were very small in caliber.  There was at least 90% stenosis of the mid  portion of a small circumflex artery.  Vessel was approximately 1.5 mm  in diameter.  There was also an 80% stenosis in the mid section of  the  right coronary artery.  Flow in the right coronary artery was TIMI-3.  However, flow in the circumflex was TIMI-2.  Given the sluggish flow in  the circumflex and the absence of electrocardiographic abnormality, I  felt that the culprit lesion was the circumflex and decided to proceed  to percutaneous treatment of that.   Bivalirudin was administered for additional anticoagulation to minimize  risk of hemorrhage.  I  used an AL-2 guide to gain access to the left  main.  Guide would not seat well.  I advanced a Prowater wire into the  mid portion of the LAD to provide some stability.  I then attempted to  advance a second Prowater beyond the lesion in the circumflex but was  unable to manipulate it beyond a tortuous segment just distal to the  onset of the first large marginal.  I then exchanged for a whisper wire,  which traversed the tortuosity and stenosis readily.  I then used a 1.5  x 15 mm Sprinter balloon and advanced it beyond the lesion into the  distal circumflex.  I removed the Whisper wire and replaced it with a  Prowater wire via the balloon lumen.  I then pulled the balloon back to  the site of the culprit and performed total of three inflations with the  longest inflation being 2 minutes.  Imaging was suboptimal as the guide  could not be engaged despite access with two wires.  I attempted to  obtain a final angiogram by advancing and H-1 diagnostic catheter in a  telescoped fashion over the wires into the left main.  This was not,  however, successful.  Nonselective angiography from the guiding catheter  did, however, demonstrate improvement in flow from TIMI-2 to TIMI-3.  The patient was pain-free.  I thought that this likely represented a  satisfactory outcome.  The guide catheter was then removed over a wire  and the patient was transferred to the holding room in stable condition  having tolerated the procedure well.   COMPLICATIONS:  None.   FINDINGS:  1. LV:  161/14/17.  EF 55% with posterobasal akinesis.  2. Left main:  Angiographically normal.  3. LAD:  Diffusely diseased small caliber vessel.  It gives rise to a      single diagonal.  This diagonal has a 50% stenosis in its      midsection.  4. Ramus intermedius:  Diffusely diseased moderate-sized vessel.  5. Circumflex:  Diffusely diseased vessel giving rise to one large and     several small obtuse marginals.  The small  second marginal has an      80% ostial stenosis.  The AV groove circumflex had at least 90%      stenosis between the origin of the second and third marginals.      This was treated with balloon angioplasty to less than 50%      stenosis.  Flow was improved TIMI-2 to TIMI-3.  6. RCA:  Moderate-sized dominant vessel.  It is 2-2.25 mm in diameter      along its course.  There is a high rising posterior  descending      artery.  Just distal to the origin of this artery is an 80%      stenosis.  There is also a 50% stenosis proximally.  7. Abdominal aorta:  Diffuse plaquing with moderate narrowing of the      distal aorta.  The right common iliac artery has at least an 80%      proximal stenosis.  The left common iliac artery is occluded.  8. Renal arteries:  Single vessels bilaterally.  Both are      angiographically normal.   IMPRESSION AND PLAN:  The patient presented with non-ST elevation  myocardial infarction with ongoing pain.  We performed a successful  balloon angioplasty to the culprit lesion in the small circumflex.  Due  to the small diameter of the right coronary artery, plan medical  therapy.  Aspirin will be continued indefinitely.  Plavix will be  initiated and she will be continued for least a year given her acute  coronary syndrome.  Carvedilol be initiated as will Statin.  Smoking  cessation is paramount.  The smoking cessation is of paramount  importance.  This was emphasized to the patient.      Salvadore Farber, MD  Electronically Signed     WED/MEDQ  D:  05/13/2006  T:  05/14/2006  Job:  [id number]

## 2010-11-01 NOTE — Assessment & Plan Note (Signed)
[facility]                            CARDIOLOGY OFFICE NOTE   HA, SHANNAHAN                        MRN:          [id number]  DATE:05/27/2006                            DOB:          1966/05/19    CARDIOLOGIST:  She is new to Dr. Randa Evens.   PRIMARY CARE PHYSICIAN:  None.   HISTORY OF PRESENT ILLNESS:  Ms. Ausburn is a 45 year old female patient  who was transferred from [facility] to [facility] on  November 28 with non-ST elevation myocardial infarction.  She went  directly to the catheterization lab, where she was found to have 90%  circumflex stenosis in the AV groove.  It was treated with balloon  angioplasty.  She also had significant disease in the RCA, but this was  a small vessel and it was decided to treat it medically.  She was also  noted to have bilateral common iliac artery stenosis.  Due to this, she  had to have her catheterization via a brachial approach on the right.  Post catheterization, she developed an antecubital hematoma and right  brachial thrombus.  She eventually went to surgery with Dr. Madilyn Fireman for  thrombectomy.  She presents to the office today for followup.  She notes  that she continues to have a lot of right upper extremity pain and  swelling.  The swelling, overall, seems to be better.  She has  difficulty with movement and has no strength.  She has been walking  about 5 minutes twice a day.  She denies any exertional chest heaviness  or tightness.  She does note a lot of chest pain related to movements of  her right arm, and her chest seems to be sore.  She will occasionally  get a sharp pain that lasts just briefly.  There is no radiation.  When  she gets pain in her right arm, she does get nauseated.  Her symptoms  are nothing like what she had with her myocardial infarction.  She  denies any syncope or presyncope.  Denies any orthopnea, paroxysmal  nocturnal dyspnea.  Denies any  pleuritic chest pain or hemoptysis.  She  does note some water brash symptoms from time to time, as well as some  questionable dysphagia.   CURRENT MEDICATIONS:  1. Plavix 75 mg daily.  2. Lipitor 80 mg nightly.  3. Aspirin 81 mg daily.  4. Carvedilol 6.25 mg twice a day.   ALLERGIES:  LORTAB, PERCOCET, DARVOCET ALL MAKE HER NAUSEATED.  She is  able to take Vicodin without nausea.   PHYSICAL EXAMINATION:  She is a well-nourished, well-developed female.  She has blood pressure of 124/78, pulse 80.  Weight 141 pounds.  HEENT:  Unremarkable.  NECK:  Without JVD.  CARDIAC:  S1, S2, regular rate and rhythm.  LUNGS: Clear to auscultation.  ABDOMEN:  Soft, nontender.  EXTREMITIES: Without edema.  Bilateral groins without hematomas.  Positive bruit on the right.  NEUROLOGIC:  Alert and oriented times 3.  Cranial nerves II-XII are  grossly intact.  RIGHT UPPER EXTREMITY: reveals moderate  amount of swelling around the  antecubital fossa with ecchymosis.  Her incision site is healing well  without erythema or drainage.  Radial and ulnar pulses are intact.  Sensation is intact.  Her right upper extremity is warm with good  capillary refill.   Electrocardiogram reveals sinus rhythm with a heart rate of 72, normal  axis, no acute changes.  T-wave inversions in III and AVF similar to  previous tracings when she was hospitalized in November of 2007.   IMPRESSION:  1. Coronary artery disease.      a.     Status post recent non-ST elevation myocardial infarction       treated with balloon angioplasty to the circumflex as noted above.      b.     Residual coronary artery disease:  Diagonal 50% mid, ramus       intermedius diffusely diseased, small second marginal 80% ostial       stenosis, right coronary artery 80% just distal to the origin and       50% proximally.  (Right coronary artery treated medically       secondary to small diameter of the artery.)  2. Preserved left ventricular  function, ejection fraction 55% at      catheterization.  3. Peripheral arterial disease, asymptomatic.      a.     Eighty percent right common iliac stenosis and totally       occluded left common iliac artery.  4. Status post right brachial thrombectomy post cardiac      catheterization via a right brachial approach.      a.     RUE swelling and pain  5. Treated dyslipidemia.  6. Tobacco abuse.  7. Anxiety.  8. Chest pain.   PLAN:  The patient presents today post hospitalization for followup.  She continues to have some atypical chest pains.  These are unlike her  myocardial infarction.  She does have some residual CAD, which could  contribute to some pain.  I am going to advance her antianginals by  increasing her Coreg to 12.5 mg twice a day.  She also has some symptoms  that sound consistent with esophageal reflux disease.  Will add generic  omeprazole 20 mg a day as well.  She is having difficulty with a lot of  pain in her right upper extremity.  I have recommended we get her back  in to see Dr. Madilyn Fireman.  We will also check an arterial Doppler on her  today to follow up post thrombectomy.  I have asked that we also get a  venus Doppler to rule out the possibility of DVT.  We will also give her  some Vicodin for pain.  We will also get her set up with a primary care  physician.  She has asked for some anti-anxiety medications.  I have  provided her with Xanax 0.25 mg 3 times a day p.r.n., and she can follow  up further with her new primary care physician for her anxiety.  She  will be switched from Lipitor to Zocor due to cost.  I will have her  follow up with Dr. Samule Ohm in the next couple of weeks to reassess her  symptoms.  At that time, if she has improved, we could certainly release  her to go back to work.    ADDENDUM:  Arterial ultrasound revealed patent RUE subclavian, brachial  and radial arteries.  Venous doppler revealed 2 brachiocephalic veins with a DVT  in the 2nd  vein.  The patient is admitted to [facility]  for heparin and coumadin therapy.  She is also interviewed and examined  by Dr. Corinda Gubler.  Will need to make a decision regarding +/- continued  Plavix therapy given the initiation of coumadin.  This will be per Dr.  Samule Ohm.   For SHx, FHx, ROS, please see the admission H&P from 05/13/2006.      Tereso Newcomer, PA-C  Electronically Signed      Cecil Cranker, MD, [facility]  Electronically Signed   SW/MedQ  DD: 05/27/2006  DT: 05/27/2006  Job #: [id number]   cc:   Balinda Quails, M.D.

## 2010-11-01 NOTE — Procedures (Signed)
NAME:  Kelsey Hansen, Kelsey Hansen                 ACCOUNT NO.:  [id number]   MEDICAL RECORD NO.:  [id number]          PATIENT TYPE:  OUT   LOCATION:  RAD                           FACILITY:  [facility]   PHYSICIAN:  Noralyn Pick. Eden Emms, MD, FACCDATE OF BIRTH:  05-Jul-1965   DATE OF PROCEDURE:  DATE OF DISCHARGE:                                ECHOCARDIOGRAM   A 45 year old female with history of MI.   Left ventricular cavity size is mildly dilated.  There was possible  distal septal hypokinesis.  EF was preserved at 55%.  There was mild LVH  with a septal thickness of 12 mm.  Mitral valve was structurally normal.  Left atrium and right-sided cardiac chambers were normal.  There was no  evidence of pulmonary hypertension.  Aortic valve was trileaflet and  sclerotic.  There was trivial aortic insufficiency.  Subcostal imaging  revealed no atrial/septal defect, no source of embolus and no effusion.   M-mode measurements included an aortic diameter of 29 mm, left atrial  diameter 35 mm, septal thickness 12 mm and systolic dimension 45 mm and  end-diastolic dimension 45 mm and systolic dimension 36 mm.   FINAL IMPRESSION:  1. Mild left ventricular cavity enlargement, possible distal septal      hypokinesis.  Ejection fraction of 55%.  2. Muscular-type band in the left ventricular apex.  3. Normal mitral valve.  4. Aortic valve sclerosis with trivial aortic insufficiency.  5. Normal left atrium and right-sided cardiac chambers.  6. No pericardial effusion.      Noralyn Pick. Eden Emms, MD, [facility]  Electronically Signed     PCN/MEDQ  D:  08/18/2006  T:  08/18/2006  Job:  [id number]

## 2010-11-01 NOTE — Letter (Signed)
Oct 28, 2007    [employer]  Attention:  [employer]   RE:  Kelsey Hansen, Kelsey Hansen  MRN:  [id number]  /  DOB:  02/16/1966   To whom it may concern:   Kelsey Hansen is a 45 year old female with a history of severe coronary  artery disease and peripheral vascular disease, who has been advised to  remain on Plavix indefinitely.  This has been expressly stated by her  primary cardiologist, Dr. Learta Codding, on previous occasions.   The patient has severe financial restraints and has submitted an  application for assistance for continued therapy with Plavix.  It has  come to our attention, however, that the patient needs to submit  evidence that she is, in fact, unemployed and does not generate any  income.  Consequently, she has been rejected for this application for  medication assistance.   We are therefore submitting this letter to corroborate her claim that  she does not work and, to the best of our knowledge, remains unemployed.  We strongly recommend, therefore, that she be considered an appropriate  candidate to receive this assistance, given that her treatment with  Plavix is a medical necessity.    Sincerely,   Clide Deutscher. Serpe, PA-C  [facility]    Sincerely,       Gene Serpe, PA-C  Electronically Signed      Learta Codding, MD,FACC  Electronically Signed   GS/MedQ  DD: 10/28/2007  DT: 10/28/2007  Job #: [id number]

## 2010-11-01 NOTE — Op Note (Signed)
Kelsey Hansen, NORDQUIST                 ACCOUNT NO.:  [id number]   MEDICAL RECORD NO.:  [id number]          PATIENT TYPE:  OIB   LOCATION:  2906                         FACILITY:  [facility]   PHYSICIAN:  Balinda Quails, M.D.    DATE OF BIRTH:  20-Oct-1965   DATE OF PROCEDURE:  05/13/2006  DATE OF DISCHARGE:                               OPERATIVE REPORT   SURGEON:  P Bud Face, MD   ASSISTANT:  Nurse.   ANESTHETIC:  Local with MAC.   ANESTHESIOLOGIST:  Zenon Mayo, MD   PREOPERATIVE DIAGNOSES:  1. Ischemic right hand.  2. Right axillary sheath hematoma.   POSTOPERATIVE DIAGNOSES:  1. Ischemic right hand.  2. Right axillary sheath hematoma.   PROCEDURE:  1. Exploration of right brachial artery with right brachial      thrombectomy.  2. Right brachial axillary sheath fasciotomy.   COMPLICATIONS:  None apparent.   CLINICAL NOTE:  Kelsey Hansen is a 45 year old female heavy tobacco use.  Presented with a history of chest pain and was diagnosed with non ST  elevation myocardial infarction.  She was taken to the cath lab by Dr.  Samule Ohm.  Attempts at vascular access in the groin were unsuccessful.  Dr. Samule Ohm therefore undertook right brachial access for coronary  angiography and percutaneous coronary angioplasty of circumflex culprit  lesion.  Following the intervention, the patient developed a hematoma in  her right antecubital fossa and the brachial area and she complained of  numbness in the right hand.  She was seen and evaluated by Dr. Samule Ohm  found to have absent pulses and evidence of the hematoma.  Vascular  surgery was consulted, the patient was evaluated.  Imaging and Doppler  evaluation right brachial artery revealed triphasic flow proximally in  right brachial artery, monophasic ulnar and radial flow.  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  The patient brought to the operating at  this time for  exploration.   OPERATIVE PROCEDURE:  The patient brought to the operating room in  stable hemodynamic condition.  Placed in the supine position.  Right arm  prepped and draped in sterile fashion.   Skin, subcutaneous tissues with 1% Xylocaine with epinephrine.  Longitudinal skin incision made encompassing the site of brachial  catheterization.  Dissection carried down through subcutaneous tissue.  The brachial axillary sheath was incised and there was hematoma present.  This had dissipated through the tissues and was not really drainable.  The axillary sheath was incised up to the proximal humerus.   The brachial artery was then exposed.  This was small vessel, 3 mm size.  Encircled proximal and distal to the site of catheterization.  There was  obvious thrombus in the vessel.  No palpable pulse.  The patient  administered 5000 units heparin intravenously.  The brachial artery was  opened transversely at the site of catheterization.  Thrombus was  removed under direct vision.  A 3 Fogarty catheter was then passed  proximally up the brachial artery.  Further thrombus removed.  Excellent  pulsatile inflow obtained.  Controlled with bulldog clamp.  The Fogarty  catheter was passed several times distally with no further return of  thrombus.  Good backbleeding.   The transverse arteriotomy was closed with interrupted 7-0 Prolene  suture.  Clamps were removed.  Excellent flow present with a palpable  right radial pulse.   Adequate hemostasis obtained.  Sponges and instrument counts correct.   The incision closed with running 3-0 Vicryl suture in two subcutaneous  layers.  4-0 Monocryl applied to the skin along with Steri-Strips.  Sterile dressings applied.  No apparent intraoperative complications.  The patient transferred recovery room in stable condition.      Balinda Quails, M.D.  Electronically Signed     PGH/MEDQ  D:  05/13/2006  T:  05/14/2006  Job:  [id number]   cc:   Salvadore Farber, MD

## 2010-11-12 ENCOUNTER — Other Ambulatory Visit: Payer: Self-pay | Admitting: *Deleted

## 2010-11-12 MED ORDER — CARVEDILOL 12.5 MG PO TABS: 12.5000 mg | ORAL_TABLET | Freq: Two times a day (BID) | ORAL | Status: DC

## 2010-12-10 ENCOUNTER — Encounter: Payer: Self-pay | Admitting: Cardiology

## 2011-01-07 ENCOUNTER — Telehealth: Payer: Self-pay | Admitting: Cardiology

## 2011-01-07 ENCOUNTER — Other Ambulatory Visit: Payer: Self-pay | Admitting: *Deleted

## 2011-01-07 MED ORDER — NITROGLYCERIN 0.4 MG SL SUBL: 0.4000 mg | SUBLINGUAL_TABLET | SUBLINGUAL | Status: DC | PRN

## 2011-01-07 MED ORDER — CLOPIDOGREL BISULFATE 75 MG PO TABS: 75.0000 mg | ORAL_TABLET | Freq: Every day | ORAL | Status: DC

## 2011-01-07 NOTE — Telephone Encounter (Signed)
..   Requested Prescriptions   Signed Prescriptions Disp Refills  . nitroGLYCERIN (NITROSTAT) 0.4 MG SL tablet 25 tablet 3    Sig: Place 1 tablet (0.4 mg total) under the tongue every 5 (five) minutes as needed. Don't exceed 3 doses in 15 minute period.    Authorizing Provider: DE Karma Lew    Ordering User: Lacie Scotts

## 2011-02-04 ENCOUNTER — Encounter: Payer: Self-pay | Admitting: Vascular Surgery

## 2011-02-20 ENCOUNTER — Ambulatory Visit: Payer: Self-pay | Admitting: Vascular Surgery

## 2011-03-06 ENCOUNTER — Ambulatory Visit: Payer: Self-pay | Admitting: Vascular Surgery

## 2011-03-11 ENCOUNTER — Encounter: Payer: Self-pay | Admitting: Cardiology

## 2011-03-13 ENCOUNTER — Ambulatory Visit: Payer: Self-pay | Admitting: Cardiology

## 2011-03-24 LAB — COMPREHENSIVE METABOLIC PANEL
ALT: 12
AST: 17
Calcium: 9.3
GFR calc Af Amer: >60
Potassium: 4.1
Sodium: 140
Total Protein: 6.3

## 2011-03-24 LAB — CBC
HCT: 41.2
Hemoglobin: 13.9
MCHC: 33.7
MCV: 93.7
Platelets: 330
RBC: 4.39
RDW: 13.1
WBC: 12.1 — ABNORMAL HIGH

## 2011-03-24 LAB — PROTIME-INR
INR: 0.9
Prothrombin Time: 12.4

## 2011-03-24 LAB — APTT: aPTT: 28

## 2011-03-24 LAB — PREGNANCY, URINE: Preg Test, Ur: NEGATIVE

## 2011-03-31 LAB — BASIC METABOLIC PANEL
BUN: 3 — ABNORMAL LOW
CO2: 30
Calcium: 8.2 — ABNORMAL LOW
Calcium: 8.3 — ABNORMAL LOW
Chloride: 100
Creatinine, Ser: 0.47
Creatinine, Ser: 0.51
GFR calc Af Amer: >60
GFR calc Af Amer: >60
GFR calc non Af Amer: >60
Potassium: 3.3 — ABNORMAL LOW
Sodium: 135

## 2011-03-31 LAB — CBC
HCT: 32.6 — ABNORMAL LOW
HCT: 36.1
Hemoglobin: 12.3
MCHC: 34
MCHC: 34.1
MCV: 93.2
MCV: 93.4
Platelets: 260
RBC: 3.38 — ABNORMAL LOW
RBC: 3.86 — ABNORMAL LOW
RDW: 13.5
WBC: 12 — ABNORMAL HIGH
WBC: 20.4 — ABNORMAL HIGH

## 2011-03-31 LAB — URINE CULTURE: Culture: NO GROWTH

## 2011-03-31 LAB — URINALYSIS, ROUTINE W REFLEX MICROSCOPIC
Bilirubin Urine: NEGATIVE
Glucose, UA: NEGATIVE
Ketones, ur: NEGATIVE
Leukocytes, UA: NEGATIVE
pH: 7

## 2011-03-31 LAB — URINE MICROSCOPIC-ADD ON

## 2011-04-01 LAB — URINALYSIS, ROUTINE W REFLEX MICROSCOPIC
Bilirubin Urine: NEGATIVE
Glucose, UA: NEGATIVE
Ketones, ur: NEGATIVE
pH: 6

## 2011-04-01 LAB — COMPREHENSIVE METABOLIC PANEL
ALT: 12
AST: 15
Alkaline Phosphatase: 93
CO2: 26
Chloride: 104
Creatinine, Ser: 0.5
GFR calc Af Amer: >60
GFR calc non Af Amer: >60
Sodium: 138
Total Bilirubin: 0.6

## 2011-04-01 LAB — TYPE AND SCREEN
ABO/RH(D): A POS
Antibody Screen: NEGATIVE

## 2011-04-01 LAB — URINE MICROSCOPIC-ADD ON

## 2011-04-01 LAB — CBC
MCV: 94
RBC: 4.18
WBC: 13.6 — ABNORMAL HIGH

## 2011-04-10 ENCOUNTER — Other Ambulatory Visit (INDEPENDENT_AMBULATORY_CARE_PROVIDER_SITE_OTHER): Payer: Medicare Other | Admitting: *Deleted

## 2011-04-10 ENCOUNTER — Ambulatory Visit: Payer: Medicare Other | Admitting: *Deleted

## 2011-04-10 ENCOUNTER — Encounter: Payer: Self-pay | Admitting: Vascular Surgery

## 2011-04-10 ENCOUNTER — Ambulatory Visit (INDEPENDENT_AMBULATORY_CARE_PROVIDER_SITE_OTHER): Payer: Medicare Other | Admitting: Vascular Surgery

## 2011-04-10 VITALS — BP 159/88 | HR 57 | Temp 98.4°F | Ht 62.0 in | Wt 149.0 lb

## 2011-04-10 DIAGNOSIS — I7092 Chronic total occlusion of artery of the extremities: Secondary | ICD-10-CM

## 2011-04-10 DIAGNOSIS — I739 Peripheral vascular disease, unspecified: Secondary | ICD-10-CM

## 2011-04-10 NOTE — Progress Notes (Signed)
[facility] HISTORY AND PHYSICAL   History of Present Illness:  Patient is a 45 y.o. year old female who presents for follow-up evaluation for PAD.  She is on Plavix and Aspirin for antiplatelet therapy. Her atherosclerotic risk factors remain elevated cholesterol, smoking, and coronary artery disease.  These are all currently stable and followed by the primary care physician as well as her cardiolgist.  The patient currently has claudication symptoms that occur in both legs left greater than right but she does not really have any significant limitation.   The patient denies rest pain or ulcers on the feet. She is actually plan extra to [place] in the near future and she will be walking all around town. She continues to smoke a half pack of cigarettes per day and this was discussed with her today.  Past Medical History  Diagnosis Date  . Coronary artery disease     Multivessel  . Microinfarction of heart 11/07    S/p non-ST elevation microinfarction/PTCA of distal circumflex  . Brachial artery occlusion     Postcatheterization complication, require surgical repair and subsequent right upper extremity thrombosis; residual 80%, distal RCA, 50% first diagonal and diffuse ramus intermedius disease, treated medically EF 55% posterior basilar akinesis  . History of cardiovascular stress test 10/08    Negative adenosine stress Cardiolite EF 62%  . PVD (peripheral vascular disease)     Severe  . 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 Stenosis of right iliac artery   . Hyperlipidemia   . Dizziness   . Joint pain   . Chest pain   . Leg pain 02/21/2010  . Anxiety     Past Surgical History  Procedure Date  . Coronary angioplasty 5/08    S/p right common iliac artery stent and right external iliac artery angioplasty  . Femoral bypass 7/08    Right to left; followed by right external iliac artery stent placed 12/08  . [place] transcatheter occlusion      Occlusion off her fem-fem bypass with claudication in both legs  . Abis 9/11    .87 on the right and .58 on the left done by vascular surgery; felt pt could be managed medically without re-intervention  . Pr vein bypass graft,aorto-fem-pop   . Coronary artery bypass graft     Review of Systems:  Neurologic: sensation in the feet is intact Cardiac:denies shortness of breath or chest pain Pulmonary: denies cough or wheeze  Social History History  Substance Use Topics  . Smoking status: Current Everyday Smoker -- 0.5 packs/day    Types: Cigarettes  . Smokeless tobacco: Not on file  . Alcohol Use:     Allergies  Allergies  Allergen Reactions  . Hydrocodone-Acetaminophen     REACTION: nausea and vomiting  . Oxycodone-Acetaminophen     REACTION: nausea and vomiting     Current Outpatient Prescriptions  Medication Sig Dispense Refill  . aspirin 81 MG tablet Take 81 mg by mouth daily.        . carvedilol (COREG) 12.5 MG tablet Take 1 tablet (12.5 mg total) by mouth 2 (two) times daily.  60 tablet  6  . clopidogrel (PLAVIX) 75 MG tablet Take 1 tablet (75 mg total) by mouth daily.  30 tablet  6  . isosorbide mononitrate (IMDUR) 60 MG 24 hr tablet Take 3 tablets (180 mg total) by mouth every morning.  90 tablet  6  .  nitroGLYCERIN (NITROSTAT) 0.4 MG SL tablet Place 1 tablet (0.4 mg total) under the tongue every 5 (five) minutes as needed. Don't exceed 3 doses in 15 minute period.  25 tablet  3  . ranitidine (ZANTAC) 150 MG tablet Take 150 mg by mouth daily as needed.        . Red Yeast Rice 600 MG CAPS Take 1,200 mg by mouth daily.        . rosuvastatin (CRESTOR) 5 MG tablet Take 5 mg by mouth at bedtime.          Physical Examination  Filed Vitals:   04/10/11 1102  BP: 159/88  Pulse: 57  Temp: 98.4 F (36.9 C)  TempSrc: Oral  Height: 5\' 2"  (1.575 m)  Weight: 149 lb (67.586 kg)    Body mass index is 27.25 kg/(m^2).  General:  Alert and oriented, no acute  distress HEENT: Normal Neck: No bruit or JVD Pulmonary: Clear to auscultation bilaterally Cardiac: Regular Rate and Rhythm with 3/6 murmur Neurologic: Upper and lower extremity motor 5/5 and symmetric Extremities:  1-2+ femoral pulses bilaterally, 2+ right posterior tibial pulse, otherwise pedal pulses absent Skin: no ulcer or rash  DATA: She had bilateral ABIs today which I reviewed and interpreted which shows an ABI on the right 0.91 with triphasic waveforms in the posterior tibial artery the left was 0.63 with monophasic waveforms   ASSESSMENT: Stable mild claudication in a patient with previous external iliac artery stenting as well as femoral-femoral bypass   PLAN: The patient will try to quit smoking. She will continue walking at least 30 minutes daily. She will followup with repeat labs in one year. She will followup with me in 2 years.  Fabienne Bruns, MD [facility] Office: [phone] Pager: [phone]

## 2011-05-13 ENCOUNTER — Other Ambulatory Visit: Payer: Self-pay | Admitting: *Deleted

## 2011-05-13 MED ORDER — ISOSORBIDE MONONITRATE ER 60 MG PO TB24: 180.0000 mg | ORAL_TABLET | ORAL | Status: DC

## 2011-05-16 ENCOUNTER — Encounter: Payer: Self-pay | Admitting: Cardiology

## 2011-05-16 ENCOUNTER — Ambulatory Visit (INDEPENDENT_AMBULATORY_CARE_PROVIDER_SITE_OTHER): Payer: Medicare Other | Admitting: Cardiology

## 2011-05-16 VITALS — BP 175/92 | HR 72 | Ht 62.0 in | Wt 150.0 lb

## 2011-05-16 DIAGNOSIS — M549 Dorsalgia, unspecified: Secondary | ICD-10-CM | POA: Insufficient documentation

## 2011-05-16 DIAGNOSIS — I10 Essential (primary) hypertension: Secondary | ICD-10-CM

## 2011-05-16 MED ORDER — TRAMADOL HCL 50 MG PO TABS: 50.0000 mg | ORAL_TABLET | Freq: Four times a day (QID) | ORAL | Status: AC | PRN

## 2011-05-16 MED ORDER — CYCLOBENZAPRINE HCL 10 MG PO TABS: 10.0000 mg | ORAL_TABLET | Freq: Three times a day (TID) | ORAL | Status: DC | PRN

## 2011-05-16 NOTE — Patient Instructions (Signed)
   Flexeril 10mg  - may use three times per day as needed for back pain x 3 weeks  Tramadol 50mg  - may take one every 6 hours as needed for severe pain in back   Patient to follow up with primary MD regarding back pain Your physician wants you to follow up in: 6 months.  You will receive a reminder letter in the mail one-two months in advance.  If you don't receive a letter, please call our office to schedule the follow up appointment

## 2011-05-16 NOTE — Assessment & Plan Note (Signed)
Blood pressure elevated. However the patient did not take her medications this morning. I asked her to follow her blood pressure closely. Her blood pressure has been normal in the past.

## 2011-05-16 NOTE — Assessment & Plan Note (Signed)
The patient is complaining of spasm on the right side of her back as well as pain on movement. I suspect she may have strained something from raking leaves. I've given her a limited prescription of Flexeril and tramadol but I asked her not to use any BC powders or ibuprofen anymore given her concomitant use of aspirin and Plavix.

## 2011-05-16 NOTE — Progress Notes (Signed)
Kelsey Bottoms, MD, [facility] ABIM Board Certified in Adult Cardiovascular Medicine,Internal Medicine and Critical Care Medicine    CC: Followup patient with history of coronary artery disease  HPI:  The patient is a 45 year old female with a history of significant peripheral arterial disease status post prior to external iliac artery stenting as well as fem-fem bypass surgery. She was recently seen by vascular surgery and was noted to have only mild claudication which was stable. The patient took a trip to [place] and was able to walk with minimal amount of pain in the lower extremities. She's also been tried to cut down her smoking. She is down to 8 cigarettes a day. Her blood pressures elevated this morning but she states that she has not taken her medications yet. Also she was having hot flashes while being in the office today. The patient has chronic stable angina. She takes approximately 10 nitroglycerin tablets a month but she states that she has been doing much more active at the with raking leaves in the yard. She states that she only gets some chest pain her activity when she uses her upper body. Walking does not cause any angina. The patient does report severe right-sided back pain and complains of muscle spasms. She's been using quite a bit of BC powders as well as ibuprofen. She has experienced little relief from her pain. From a cardiac standpoint is otherwise doing well. She reports no orthopnea PND palpitations or syncope. Patient states that she is not using Crestor which causes severe myalgias. She is using her red yeast rice.     PMH: reviewed and listed in Problem List in Electronic Records (and see below) Past Medical History  Diagnosis Date  . Coronary artery disease     Multivessel  . Microinfarction of heart 11/07    S/p non-ST elevation microinfarction/PTCA of distal circumflex  . Brachial artery occlusion     Postcatheterization complication, require surgical  repair and subsequent right upper extremity thrombosis; residual 80%, distal RCA, 50% first diagonal and diffuse ramus intermedius disease, treated medically EF 55% posterior basilar akinesis  . History of cardiovascular stress test 10/08    Negative adenosine stress Cardiolite EF 62%  . PVD (peripheral vascular disease)     Severe  . 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 Stenosis of right iliac artery   . Hyperlipidemia   . Dizziness   . Joint pain   . Chest pain   . Leg pain 02/21/2010  . Anxiety      Allergies/SH/FHX : available in Electronic Records for review  Medications: Current Outpatient Prescriptions  Medication Sig Dispense Refill  . aspirin 81 MG tablet Take 81 mg by mouth daily.        . carvedilol (COREG) 12.5 MG tablet Take 1 tablet (12.5 mg total) by mouth 2 (two) times daily.  60 tablet  6  . clopidogrel (PLAVIX) 75 MG tablet Take 1 tablet (75 mg total) by mouth daily.  30 tablet  6  . isosorbide mononitrate (IMDUR) 60 MG 24 hr tablet Take 3 tablets (180 mg total) by mouth every morning.  90 tablet  0  . nitroGLYCERIN (NITROSTAT) 0.4 MG SL tablet Place 1 tablet (0.4 mg total) under the tongue every 5 (five) minutes as needed. Don't exceed 3 doses in 15 minute period.  25 tablet  3  . ranitidine (ZANTAC) 150 MG tablet Take 150 mg by mouth daily as needed.        Marland Kitchen  Red Yeast Rice 600 MG CAPS Take 1,200 mg by mouth daily.        . rosuvastatin (CRESTOR) 5 MG tablet Take 5 mg by mouth at bedtime.          ROS: No nausea or vomiting. No fever or chills.No melena or hematochezia.No bleeding.No claudication  Physical Exam: BP 175/92  Pulse 72  Ht 5\' 2"  (1.575 m)  Wt 150 lb (68.04 kg)  BMI 27.44 kg/m2 General: Well-nourished white female complaining of hot flashes Neck: Normal carotid upstroke no carotid bruits. No thyromegaly nonnodular thyroid. JVD is approximately 6 cm. Lungs: Clear breath sounds bilaterally. No wheezing. Cardiac: Regular rate and  rhythm with normal S1-S2 no pathological murmurs Vascular: No edema. Right posterior tibial pulse is palpable otherwise no palpable pulses distally. Skin: Warm and dry  12lead ECG: Not available  Limited bedside ECHO:N/A   Assessment and Plan

## 2011-05-16 NOTE — Assessment & Plan Note (Signed)
The patient has a history of non-ST elevation myocardial infarction of the circumflex with stenting. She also has significant residual RCA disease with an 80% stenosis. Her angina pattern is currently stable. We did discuss carefully that her pattern worsens or becomes unstable she will need to notify me immediately and she may require a repeat cardiac catheterization. Patient is unable to take Crestor due to severe myalgias. She is continuing with red yeast rice.

## 2011-05-16 NOTE — Assessment & Plan Note (Signed)
Easily seen by Dr. Darrick Penna with stable symptoms. Continue therapeutic lifestyle changes and the patient has been cutting down her smoking.

## 2011-06-04 ENCOUNTER — Other Ambulatory Visit: Payer: Self-pay | Admitting: Cardiology

## 2011-06-12 ENCOUNTER — Other Ambulatory Visit: Payer: Self-pay | Admitting: Cardiology

## 2011-06-12 ENCOUNTER — Other Ambulatory Visit: Payer: Self-pay | Admitting: *Deleted

## 2011-06-12 MED ORDER — CARVEDILOL 12.5 MG PO TABS: 12.5000 mg | ORAL_TABLET | Freq: Two times a day (BID) | ORAL | Status: DC

## 2011-07-24 ENCOUNTER — Encounter: Payer: Self-pay | Admitting: Cardiology

## 2011-07-24 ENCOUNTER — Telehealth: Payer: Self-pay | Admitting: *Deleted

## 2011-07-24 NOTE — Telephone Encounter (Signed)
Patient has a new insurance for her prescriptions and they are requesting a letter of medical necessity in order to cover plavix. Nurse informed patient to bring letter to office and MD would be informed of request.

## 2011-07-25 NOTE — Telephone Encounter (Signed)
Patient informed that letter ready for pick up.

## 2011-08-07 ENCOUNTER — Telehealth: Payer: Self-pay | Admitting: *Deleted

## 2011-08-07 ENCOUNTER — Other Ambulatory Visit: Payer: Self-pay | Admitting: Cardiology

## 2011-08-07 MED ORDER — PLAVIX 75 MG PO TABS: 75.0000 mg | ORAL_TABLET | Freq: Every day | ORAL | Status: DC

## 2011-08-07 NOTE — Telephone Encounter (Signed)
Patient stated that she tried generic plavix last year when it first became available and that it caused her to have joint and muscle aches, and circulation issues. Nurse informed patient that [employer] would be called.

## 2011-08-07 NOTE — Telephone Encounter (Signed)
Approval letter received.

## 2011-08-07 NOTE — Telephone Encounter (Signed)
Called [employer] and initiated prior auth process for brand name plavix. Awaiting results to be faxed to office.

## 2011-08-08 ENCOUNTER — Other Ambulatory Visit: Payer: Self-pay | Admitting: *Deleted

## 2011-08-08 MED ORDER — ISOSORBIDE MONONITRATE ER 60 MG PO TB24: 180.0000 mg | ORAL_TABLET | Freq: Every day | ORAL | Status: DC

## 2011-08-08 MED ORDER — PLAVIX 75 MG PO TABS: 75.0000 mg | ORAL_TABLET | Freq: Every day | ORAL | Status: DC

## 2011-08-08 MED ORDER — CARVEDILOL 12.5 MG PO TABS: 12.5000 mg | ORAL_TABLET | Freq: Two times a day (BID) | ORAL | Status: DC

## 2011-11-07 ENCOUNTER — Other Ambulatory Visit: Payer: Self-pay | Admitting: Cardiology

## 2012-01-08 ENCOUNTER — Other Ambulatory Visit: Payer: Self-pay | Admitting: Cardiology

## 2012-03-09 ENCOUNTER — Other Ambulatory Visit: Payer: Self-pay | Admitting: Cardiology

## 2012-04-02 ENCOUNTER — Other Ambulatory Visit: Payer: Self-pay | Admitting: *Deleted

## 2012-04-02 DIAGNOSIS — Z48812 Encounter for surgical aftercare following surgery on the circulatory system: Secondary | ICD-10-CM

## 2012-04-02 DIAGNOSIS — I739 Peripheral vascular disease, unspecified: Secondary | ICD-10-CM

## 2012-04-09 ENCOUNTER — Other Ambulatory Visit: Payer: Medicare Other

## 2012-04-19 ENCOUNTER — Ambulatory Visit (INDEPENDENT_AMBULATORY_CARE_PROVIDER_SITE_OTHER): Payer: Medicare Other | Admitting: Vascular Surgery

## 2012-04-19 DIAGNOSIS — I739 Peripheral vascular disease, unspecified: Secondary | ICD-10-CM

## 2012-04-19 DIAGNOSIS — Z48812 Encounter for surgical aftercare following surgery on the circulatory system: Secondary | ICD-10-CM

## 2012-04-19 NOTE — Progress Notes (Signed)
Ankle brachial index performed @ [facility] 04/19/2012 

## 2012-04-21 ENCOUNTER — Ambulatory Visit (INDEPENDENT_AMBULATORY_CARE_PROVIDER_SITE_OTHER): Payer: Medicare Other | Admitting: Cardiology

## 2012-04-21 ENCOUNTER — Other Ambulatory Visit: Payer: Self-pay | Admitting: *Deleted

## 2012-04-21 ENCOUNTER — Encounter: Payer: Self-pay | Admitting: Cardiology

## 2012-04-21 VITALS — BP 134/80 | HR 57 | Ht 62.0 in | Wt 145.1 lb

## 2012-04-21 DIAGNOSIS — E785 Hyperlipidemia, unspecified: Secondary | ICD-10-CM

## 2012-04-21 DIAGNOSIS — Z79899 Other long term (current) drug therapy: Secondary | ICD-10-CM

## 2012-04-21 DIAGNOSIS — E782 Mixed hyperlipidemia: Secondary | ICD-10-CM | POA: Insufficient documentation

## 2012-04-21 DIAGNOSIS — F17200 Nicotine dependence, unspecified, uncomplicated: Secondary | ICD-10-CM

## 2012-04-21 DIAGNOSIS — I7389 Other specified peripheral vascular diseases: Secondary | ICD-10-CM

## 2012-04-21 DIAGNOSIS — I10 Essential (primary) hypertension: Secondary | ICD-10-CM

## 2012-04-21 DIAGNOSIS — I2510 Atherosclerotic heart disease of native coronary artery without angina pectoris: Secondary | ICD-10-CM

## 2012-04-21 MED ORDER — CYCLOBENZAPRINE HCL 10 MG PO TABS: 10.0000 mg | ORAL_TABLET | Freq: Three times a day (TID) | ORAL | Status: AC | PRN

## 2012-04-21 NOTE — Assessment & Plan Note (Signed)
Looks to be poorly controlled over time with statin intolerance as noted above. Followup LFTs and FLP. We might be able to try Pravachol.

## 2012-04-21 NOTE — Assessment & Plan Note (Signed)
I discussed smoking cessation with her today. He has not been able to quit.

## 2012-04-21 NOTE — Patient Instructions (Addendum)
Your physician recommends that you schedule a follow-up appointment in: 6 months. You will receive a reminder letter in the mail in about 4 months reminding you to call and schedule your appointment. If you don't receive this letter, please contact our office.  Your physician recommends that you continue on your current medications as directed. Please refer to the Current Medication list given to you today.  Your physician recommends that you return for a FASTING lipid/liver profile at [facility] no later than 04/23/12.

## 2012-04-21 NOTE — Assessment & Plan Note (Signed)
Stable angina on medical therapy. ECG reviewed and also stable. Cardiolite from last year showed no large ischemic defects. We will continue medical therapy and observation for now.

## 2012-04-21 NOTE — Progress Notes (Signed)
Clinical Summary Ms. Jabour is a 46 y.o.female with advanced atherosclerotic disease presenting for office followup. She is a former patient of Dr. Andee Lineman, prefers to stay with [place]. She was last seen in the office back in November 2012, this is my first meeting with her today. I reviewed her records.  Fortunately, she reports no escalating angina, uses nitroglycerin occasionally when she "overdoes it." Dr. Darrick Penna follows her PAD. She had recent Dopplers, report pending.  Last ischemic workup was via a Lexiscan Cardiolite in January 2012 with report indicating no active ischemic defects, LVEF 57%.  Today's ECG shows sinus bradycardia with left atrial enlargement, no acute ST segment changes.  She has not had a recent lipid panel. LDL was 202 in January 2012. She reports intolerances to Crestor, Lipitor, and Zocor.  She does not have a primary care physician. Has not been able to quit smoking cigarettes. We discussed the critical importance of smoking cessation today and I encouraged to keep working on this.  Allergies  Allergen Reactions  . Hydrocodone-Acetaminophen     REACTION: nausea and vomiting  . Oxycodone-Acetaminophen     REACTION: nausea and vomiting    Current Outpatient Prescriptions  Medication Sig Dispense Refill  . aspirin 81 MG tablet Take 81 mg by mouth daily.        . carvedilol (COREG) 12.5 MG tablet Take 1 tablet (12.5 mg total) by mouth 2 (two) times daily.  180 tablet  3  . cyclobenzaprine (FLEXERIL) 10 MG tablet Take 1 tablet (10 mg total) by mouth every 8 (eight) hours as needed for muscle spasms.  30 tablet  3  . isosorbide mononitrate (IMDUR) 60 MG 24 hr tablet TAKE 3 TABLETS BY MOUTH EVERY MORNING  90 tablet  3  . nitroGLYCERIN (NITROSTAT) 0.4 MG SL tablet Place 1 tablet (0.4 mg total) under the tongue every 5 (five) minutes as needed. Don't exceed 3 doses in 15 minute period.  25 tablet  3  . PLAVIX 75 MG tablet TAKE 1 TABLET BY MOUTH DAILY  30 tablet  1    . ranitidine (ZANTAC) 150 MG tablet Take 150 mg by mouth daily as needed.        . Red Yeast Rice 600 MG CAPS Take 1,200 mg by mouth daily.        . [DISCONTINUED] NITROSTAT 0.4 MG SL tablet DISSOLVE 1 TAB UNDER THE TONGUE EVERY 5 MINUTES AS NEEDED DO NOT EXCEED 3 DOSES IN 15 MIN PERIOD  25 tablet  3  . [DISCONTINUED] isosorbide mononitrate (IMDUR) 60 MG 24 hr tablet Take 3 tablets (180 mg total) by mouth daily.  270 tablet  3    Past Medical History  Diagnosis Date  . Coronary atherosclerosis of native coronary artery     Multivessel  . NSTEMI (non-ST elevated myocardial infarction) 11/07    PTCA of distal circumflex 2007, residual 50% LAD and 80% PDA  . Brachial artery occlusion     Postcatheterization complication, required surgical repair  . Peripheral arterial disease     Right common and external iliac stent, right to left fem-fem bypass  . Hyperlipidemia   . Anxiety     Social History Ms. Sasnett reports that she has been smoking Cigarettes.  She has a 15 pack-year smoking history. She has never used smokeless tobacco. Ms. Bratten reports that she does not drink alcohol.  Review of Systems No palpitations, orthopnea, PND. No reported bleeding problems. Has chronic shoulder pain and muscle spasm, uses Flexeril  as needed.  Physical Examination Filed Vitals:   04/21/12 0836  BP: 134/80  Pulse: 57   Filed Weights   04/21/12 0836  Weight: 145 lb 1.9 oz (65.826 kg)   No acute distress. HEENT: Conjunctiva and lids normal, oropharynx clear with poor dentition. Neck: Supple, no elevated JVP or carotid bruits, no thyromegaly. Lungs: Diminished but clear to auscultation, nonlabored breathing at rest. Cardiac: Regular rate and rhythm, no S3 or significant systolic murmur, no pericardial rub. Abdomen: Soft, nontender, bowel sounds present. Extremities: No pitting edema, distal pulses significantly diminished.. Skin: Warm and dry. Musculoskeletal: No kyphosis. Neuropsychiatric:  Alert and oriented x3, affect grossly appropriate.   Problem List and Plan   Coronary atherosclerosis of native coronary artery Stable angina on medical therapy. ECG reviewed and also stable. Cardiolite from last year showed no large ischemic defects. We will continue medical therapy and observation for now.  TOBACCO ABUSE I discussed smoking cessation with her today. He has not been able to quit.  Essential hypertension, benign No change to blood pressure regimen.  Mixed hyperlipidemia Looks to be poorly controlled over time with statin intolerance as noted above. Followup LFTs and FLP. We might be able to try Pravachol.  PERIPHERAL VASCULAR DISEASE WITH CLAUDICATION Keep regular followup with Dr. Darrick Penna. Discussed walking regimen and smoking cessation.    Jonelle Sidle, M.D., F.A.C.C.

## 2012-04-21 NOTE — Assessment & Plan Note (Signed)
Keep regular followup with Dr. Darrick Penna. Discussed walking regimen and smoking cessation.

## 2012-04-21 NOTE — Assessment & Plan Note (Signed)
No change to blood pressure regimen.

## 2012-05-02 ENCOUNTER — Other Ambulatory Visit: Payer: Self-pay | Admitting: Cardiology

## 2012-05-10 ENCOUNTER — Other Ambulatory Visit: Payer: Self-pay | Admitting: Cardiology

## 2012-06-02 ENCOUNTER — Telehealth: Payer: Self-pay | Admitting: *Deleted

## 2012-06-02 ENCOUNTER — Encounter: Payer: Self-pay | Admitting: *Deleted

## 2012-06-02 NOTE — Telephone Encounter (Signed)
Left message on voicemail of patient to call our office r/e pass due fasting labs that were requested during last office visit. Letter mailed also.

## 2012-06-22 ENCOUNTER — Telehealth: Payer: Self-pay | Admitting: *Deleted

## 2012-06-22 NOTE — Telephone Encounter (Signed)
Left message for patient to call office r/e whether or not patient plans to have the fasting labs done.

## 2012-07-27 NOTE — Telephone Encounter (Signed)
Left message for patient to call office.  

## 2012-07-28 NOTE — Telephone Encounter (Signed)
Message left on nurse's voicemail that patient was returning call. Nurse returned call and left message on voicemail of patient informing her to call back to inform our office of her intentions toward requested fasting lab work from November 2013.

## 2012-07-28 NOTE — Telephone Encounter (Signed)
Patient called and said she is going to have fasting lab work done on Monday 08/02/12.

## 2012-09-02 ENCOUNTER — Other Ambulatory Visit: Payer: Self-pay | Admitting: Physician Assistant

## 2012-09-05 ENCOUNTER — Other Ambulatory Visit: Payer: Self-pay | Admitting: Cardiology

## 2012-10-06 ENCOUNTER — Other Ambulatory Visit: Payer: Self-pay | Admitting: Cardiology

## 2012-11-04 ENCOUNTER — Other Ambulatory Visit: Payer: Self-pay | Admitting: Physician Assistant

## 2012-11-16 ENCOUNTER — Other Ambulatory Visit: Payer: Self-pay | Admitting: Cardiology

## 2012-11-27 ENCOUNTER — Other Ambulatory Visit: Payer: Self-pay | Admitting: Cardiology

## 2012-11-29 NOTE — Telephone Encounter (Signed)
Medication sent via escribe.  

## 2012-12-08 ENCOUNTER — Other Ambulatory Visit: Payer: Self-pay | Admitting: Cardiology

## 2012-12-08 MED ORDER — PLAVIX 75 MG PO TABS: 75.0000 mg | ORAL_TABLET | Freq: Every day | ORAL | Status: DC

## 2013-01-05 ENCOUNTER — Other Ambulatory Visit: Payer: Self-pay | Admitting: Cardiology

## 2013-02-16 ENCOUNTER — Encounter: Payer: Self-pay | Admitting: Cardiology

## 2013-02-16 ENCOUNTER — Ambulatory Visit (INDEPENDENT_AMBULATORY_CARE_PROVIDER_SITE_OTHER): Payer: Medicare Other | Admitting: Cardiology

## 2013-02-16 VITALS — BP 114/74 | HR 73 | Ht 62.0 in | Wt 137.8 lb

## 2013-02-16 DIAGNOSIS — I2510 Atherosclerotic heart disease of native coronary artery without angina pectoris: Secondary | ICD-10-CM

## 2013-02-16 DIAGNOSIS — I7389 Other specified peripheral vascular diseases: Secondary | ICD-10-CM

## 2013-02-16 DIAGNOSIS — I10 Essential (primary) hypertension: Secondary | ICD-10-CM

## 2013-02-16 DIAGNOSIS — F17200 Nicotine dependence, unspecified, uncomplicated: Secondary | ICD-10-CM

## 2013-02-16 DIAGNOSIS — E782 Mixed hyperlipidemia: Secondary | ICD-10-CM

## 2013-02-16 NOTE — Assessment & Plan Note (Signed)
Multivessel disease status post previous CABG, ischemic workup negative in 2012. We reviewed her medications. I asked her to keep an eye on frequency of nitroglycerin use as followup testing may be indicated. Stressed the importance of regular followup and compliance. Also congratulated her on trying to stop smoking.

## 2013-02-16 NOTE — Assessment & Plan Note (Signed)
She reports stable symptoms. 

## 2013-02-16 NOTE — Assessment & Plan Note (Signed)
Blood pressure is normal today. 

## 2013-02-16 NOTE — Assessment & Plan Note (Signed)
Fasting lipid panel will be arranged.

## 2013-02-16 NOTE — Patient Instructions (Addendum)
Your physician recommends that you schedule a follow-up appointment in: 3 months. You will receive a reminder letter in the mail in about 1-2 months reminding you to call and schedule your appointment. If you don't receive this letter, please contact our office. Your physician recommends that you continue on your current medications as directed. Please refer to the Current Medication list given to you today. Your physician recommends that you return for a FASTING lipid profile as soon as possible.

## 2013-02-16 NOTE — Progress Notes (Signed)
Clinical Summary Ms. Cassel is a 47 y.o.female last seen in November 2013, a former patient of Dr. Andee Lineman. She has not followed up for her scheduled visit or lab work since last assessment. She presents today stating that she has been taking her medications. Does use sublingual nitroglycerin regularly. ECG today shows normal sinus rhythm.  Last ischemic workup was via a Lexiscan Cardiolite in January 2012 with report indicating no active ischemic defects, LVEF 57%.  She has had difficulty with statin medications in the past. States she was able to take red yeast rice. As noted above, no followup assessment of lipids as yet.  She is trying to quit smoking. We talked about using a nicotine replacement over-the-counter.  She does not have a primary care doctor. I once again stressed the importance of finding one.   Allergies  Allergen Reactions  . Hydrocodone-Acetaminophen     REACTION: nausea and vomiting  . Oxycodone-Acetaminophen     REACTION: nausea and vomiting    Current Outpatient Prescriptions  Medication Sig Dispense Refill  . aspirin 81 MG tablet Take 81 mg by mouth daily.        . carvedilol (COREG) 12.5 MG tablet TAKE 1 TABLET BY MOUTH 2 TIMES DAILY*NEEDS OFFICE VISIT*  180 tablet  0  . cyclobenzaprine (FLEXERIL) 10 MG tablet Take 1 tablet (10 mg total) by mouth every 8 (eight) hours as needed for muscle spasms.  30 tablet  3  . isosorbide mononitrate (IMDUR) 60 MG 24 hr tablet TAKE 3 TABLETS BY MOUTH EVERY MORNING  90 tablet  3  . nicotine (NICOTROL) 10 MG inhaler Inhale 1 puff into the lungs as needed for smoking cessation.      . nitroGLYCERIN (NITROSTAT) 0.4 MG SL tablet Place 1 tablet (0.4 mg total) under the tongue every 5 (five) minutes as needed. Don't exceed 3 doses in 15 minute period.  25 tablet  3  . PLAVIX 75 MG tablet Take 1 tablet (75 mg total) by mouth daily.  30 tablet  3  . ranitidine (ZANTAC) 150 MG tablet Take 150 mg by mouth daily as needed.        . Red  Yeast Rice 600 MG CAPS Take 1,200 mg by mouth daily.         No current facility-administered medications for this visit.    Past Medical History  Diagnosis Date  . Coronary atherosclerosis of native coronary artery     Multivessel  . NSTEMI (non-ST elevated myocardial infarction) 11/07    PTCA of distal circumflex 2007, residual 50% LAD and 80% PDA  . Brachial artery occlusion     Postcatheterization complication, required surgical repair  . Peripheral arterial disease     Right common and external iliac stent, right to left fem-fem bypass  . Hyperlipidemia   . Anxiety     Past Surgical History  Procedure Laterality Date  . Coronary angioplasty  5/08    S/p right common iliac artery stent and right external iliac artery angioplasty  . Femoral bypass  7/08    Right to left; followed by right external iliac artery stent placed 12/08  . Sp [place] transcatheter occlusion      Occlusion off her fem-fem bypass with claudication in both legs  . Abis  9/11    .87 on the right and .58 on the left done by vascular surgery; felt pt could be managed medically without re-intervention  . Pr vein bypass graft,aorto-fem-pop    . Coronary artery  bypass graft      Social History Ms. Rohwer reports that she has quit smoking. Her smoking use included Cigarettes. She has a 15 pack-year smoking history. She has never used smokeless tobacco. Ms. Weberg reports that she does not drink alcohol.  Review of Systems Problems with restless legs. Some anxiety. No claudication. Stable appetite. Otherwise negative.  Physical Examination Filed Vitals:   02/16/13 1342  BP: 114/74  Pulse: 73   Filed Weights   02/16/13 1342  Weight: 137 lb 12.8 oz (62.506 kg)    Appears comfortable at rest. HEENT: Conjunctiva and lids normal, oropharynx clear with poor dentition.  Neck: Supple, no elevated JVP or carotid bruits, no thyromegaly.  Lungs: Diminished but clear to auscultation, nonlabored breathing at rest.    Cardiac: Regular rate and rhythm, no S3 or significant systolic murmur, no pericardial rub.  Abdomen: Soft, nontender, bowel sounds present.  Extremities: No pitting edema, distal pulses significantly diminished..  Skin: Warm and dry.  Musculoskeletal: No kyphosis.  Neuropsychiatric: Alert and oriented x3, affect grossly appropriate.   Problem List and Plan   Coronary atherosclerosis of native coronary artery Multivessel disease status post previous CABG, ischemic workup negative in 2012. We reviewed her medications. I asked her to keep an eye on frequency of nitroglycerin use as followup testing may be indicated. Stressed the importance of regular followup and compliance. Also congratulated her on trying to stop smoking.  Mixed hyperlipidemia Fasting lipid panel will be arranged.  Essential hypertension, benign Blood pressure is normal today.  PERIPHERAL VASCULAR DISEASE WITH CLAUDICATION She reports stable symptoms.  TOBACCO ABUSE We discussed smoking cessation, she seems to be motivated. Discussed nicotine replacement.    Jonelle Sidle, M.D., F.A.C.C.

## 2013-02-16 NOTE — Assessment & Plan Note (Signed)
We discussed smoking cessation, she seems to be motivated. Discussed nicotine replacement.

## 2013-02-25 ENCOUNTER — Telehealth: Payer: Self-pay | Admitting: *Deleted

## 2013-02-25 NOTE — Telephone Encounter (Signed)
Patient informed and agrees to re-starting the red yeast rice and high fiber diet.

## 2013-02-25 NOTE — Telephone Encounter (Signed)
Message copied by Eustace Moore on Fri Feb 25, 2013 12:04 PM ------      Message from: MCDOWELL, Illene Bolus      Created: Mon Feb 21, 2013  8:50 AM       Reviewed. Total cholesterol 238, LDL 177. Records indicate that she has had difficulty tolerating statins. She mentioned possibly trying red yeast rice extract. Also recommend high fiber diet. ------

## 2013-02-28 ENCOUNTER — Other Ambulatory Visit: Payer: Self-pay | Admitting: *Deleted

## 2013-02-28 DIAGNOSIS — M79609 Pain in unspecified limb: Secondary | ICD-10-CM

## 2013-02-28 DIAGNOSIS — Z48812 Encounter for surgical aftercare following surgery on the circulatory system: Secondary | ICD-10-CM

## 2013-02-28 DIAGNOSIS — I739 Peripheral vascular disease, unspecified: Secondary | ICD-10-CM

## 2013-03-02 ENCOUNTER — Other Ambulatory Visit: Payer: Medicare Other

## 2013-03-02 ENCOUNTER — Ambulatory Visit: Payer: Medicare Other | Admitting: Family

## 2013-03-24 ENCOUNTER — Other Ambulatory Visit: Payer: Self-pay | Admitting: Physician Assistant

## 2013-04-05 ENCOUNTER — Other Ambulatory Visit: Payer: Self-pay | Admitting: Cardiology

## 2013-04-07 ENCOUNTER — Other Ambulatory Visit: Payer: Self-pay | Admitting: Cardiology

## 2013-04-12 ENCOUNTER — Encounter: Payer: Self-pay | Admitting: Cardiology

## 2013-08-15 ENCOUNTER — Telehealth: Payer: Self-pay

## 2013-08-15 DIAGNOSIS — I739 Peripheral vascular disease, unspecified: Secondary | ICD-10-CM

## 2013-08-15 NOTE — Telephone Encounter (Signed)
Phone call from pt.  Reports a firm area in right groin, that has increased in size over past 2 mos.;  states it was pea-size, and now it is golf ball-size; reports intermittent pain in site.   Also reports her legs are "jumpy" intermittently.  Denies any open sores.  States that has had gradually worsening bilateral leg pain over past 1.5-2 mos. Describes leg pain as a "burning-type pain" if she walks a slow pace and for short distance.  C/o intermittent "shooting pain in lower legs" when she walks very far.  Pt. Is past due for her f/u appt.  Advised will have a scheduler call her tomorrow with future appt.  Advised to call office if symptoms worsen prior to appt.  Verb. Understanding.

## 2013-08-16 NOTE — Telephone Encounter (Signed)
Left detailed message for pt with appt for 08/18/13 @ 9:00am, dpm

## 2013-08-17 ENCOUNTER — Encounter: Payer: Self-pay | Admitting: Family

## 2013-08-18 ENCOUNTER — Other Ambulatory Visit (HOSPITAL_COMMUNITY): Payer: Medicare Other

## 2013-08-18 ENCOUNTER — Ambulatory Visit: Payer: Medicare Other | Admitting: Family

## 2013-08-18 ENCOUNTER — Encounter (HOSPITAL_COMMUNITY): Payer: Medicare Other

## 2013-08-19 ENCOUNTER — Ambulatory Visit (INDEPENDENT_AMBULATORY_CARE_PROVIDER_SITE_OTHER)
Admission: RE | Admit: 2013-08-19 | Discharge: 2013-08-19 | Disposition: A | Discharge status: Home / Self Care | Payer: Medicare Other | Source: Ambulatory Visit | Attending: Vascular Surgery | Admitting: Vascular Surgery

## 2013-08-19 ENCOUNTER — Other Ambulatory Visit: Payer: Self-pay | Admitting: Vascular Surgery

## 2013-08-19 ENCOUNTER — Encounter: Payer: Self-pay | Admitting: Family

## 2013-08-19 ENCOUNTER — Ambulatory Visit (HOSPITAL_COMMUNITY)
Admission: RE | Admit: 2013-08-19 | Discharge: 2013-08-19 | Disposition: A | Discharge status: Home / Self Care | Payer: Medicare Other | Source: Ambulatory Visit | Attending: Family | Admitting: Family

## 2013-08-19 ENCOUNTER — Ambulatory Visit (INDEPENDENT_AMBULATORY_CARE_PROVIDER_SITE_OTHER): Payer: Medicare Other | Admitting: Family

## 2013-08-19 VITALS — BP 118/73 | HR 67 | Resp 16 | Ht 62.0 in | Wt 134.0 lb

## 2013-08-19 DIAGNOSIS — Z48812 Encounter for surgical aftercare following surgery on the circulatory system: Secondary | ICD-10-CM

## 2013-08-19 DIAGNOSIS — R200 Anesthesia of skin: Secondary | ICD-10-CM | POA: Insufficient documentation

## 2013-08-19 DIAGNOSIS — I739 Peripheral vascular disease, unspecified: Secondary | ICD-10-CM

## 2013-08-19 DIAGNOSIS — M7989 Other specified soft tissue disorders: Secondary | ICD-10-CM

## 2013-08-19 DIAGNOSIS — R209 Unspecified disturbances of skin sensation: Secondary | ICD-10-CM

## 2013-08-19 DIAGNOSIS — R29898 Other symptoms and signs involving the musculoskeletal system: Secondary | ICD-10-CM | POA: Insufficient documentation

## 2013-08-19 DIAGNOSIS — M79609 Pain in unspecified limb: Secondary | ICD-10-CM

## 2013-08-19 NOTE — Patient Instructions (Addendum)
Peripheral Vascular Disease Peripheral Vascular Disease (PVD), also called Peripheral Arterial Disease (PAD), is a circulation problem caused by cholesterol (atherosclerotic plaque) deposits in the arteries. PVD commonly occurs in the lower extremities (legs) but it can occur in other areas of the body, such as your arms. The cholesterol buildup in the arteries reduces blood flow which can cause pain and other serious problems. The presence of PVD can place a person at risk for Coronary Artery Disease (CAD).  CAUSES  Causes of PVD can be many. It is usually associated with more than one risk factor such as:   High Cholesterol.  Smoking.  Diabetes.  Lack of exercise or inactivity.  High blood pressure (hypertension).  Obesity.  Family history. SYMPTOMS   When the lower extremities are affected, patients with PVD may experience:  Leg pain with exertion or physical activity. This is called INTERMITTENT CLAUDICATION. This may present as cramping or numbness with physical activity. The location of the pain is associated with the level of blockage. For example, blockage at the abdominal level (distal abdominal aorta) may result in buttock or hip pain. Lower leg arterial blockage may result in calf pain.  As PVD becomes more severe, pain can develop with less physical activity.  In people with severe PVD, leg pain may occur at rest.  Other PVD signs and symptoms:  Leg numbness or weakness.  Coldness in the affected leg or foot, especially when compared to the other leg.  A change in leg color.  Patients with significant PVD are more prone to ulcers or sores on toes, feet or legs. These may take longer to heal or may reoccur. The ulcers or sores can become infected.  If signs and symptoms of PVD are ignored, gangrene may occur. This can result in the loss of toes or loss of an entire limb.  Not all leg pain is related to PVD. Other medical conditions can cause leg pain such  as:  Blood clots (embolism) or Deep Vein Thrombosis.  Inflammation of the blood vessels (vasculitis).  Spinal stenosis. DIAGNOSIS  Diagnosis of PVD can involve several different types of tests. These can include:  Pulse Volume Recording Method (PVR). This test is simple, painless and does not involve the use of X-rays. PVR involves measuring and comparing the blood pressure in the arms and legs. An ABI (Ankle-Brachial Index) is calculated. The normal ratio of blood pressures is 1. As this number becomes smaller, it indicates more severe disease.  < 0.95  indicates significant narrowing in one or more leg vessels.  <0.8 there will usually be pain in the foot, leg or buttock with exercise.  <0.4 will usually have pain in the legs at rest.  <0.25  usually indicates limb threatening PVD.  Doppler detection of pulses in the legs. This test is painless and checks to see if you have a pulses in your legs/feet.  A dye or contrast material (a substance that highlights the blood vessels so they show up on x-ray) may be given to help your caregiver better see the arteries for the following tests. The dye is eliminated from your body by the kidney's. Your caregiver may order blood work to check your kidney function and other laboratory values before the following tests are performed:  Magnetic Resonance Angiography (MRA). An MRA is a picture study of the blood vessels and arteries. The MRA machine uses a large magnet to produce images of the blood vessels.  Computed Tomography Angiography (CTA). A CTA is a   specialized x-ray that looks at how the blood flows in your blood vessels. An IV may be inserted into your arm so contrast dye can be injected.  Angiogram. Is a procedure that uses x-rays to look at your blood vessels. This procedure is minimally invasive, meaning a small incision (cut) is made in your groin. A small tube (catheter) is then inserted into the artery of your groin. The catheter is  guided to the blood vessel or artery your caregiver wants to examine. Contrast dye is injected into the catheter. X-rays are then taken of the blood vessel or artery. After the images are obtained, the catheter is taken out. TREATMENT  Treatment of PVD involves many interventions which may include:  Lifestyle changes:  Quitting smoking.  Exercise.  Following a low fat, low cholesterol diet.  Control of diabetes.  Foot care is very important to the PVD patient. Good foot care can help prevent infection.  Medication:  Cholesterol-lowering medicine.  Blood pressure medicine.  Anti-platelet drugs.  Certain medicines may reduce symptoms of Intermittent Claudication.  Interventional/Surgical options:  Angioplasty. An Angioplasty is a procedure that inflates a balloon in the blocked artery. This opens the blocked artery to improve blood flow.  Stent Implant. A wire mesh tube (stent) is placed in the artery. The stent expands and stays in place, allowing the artery to remain open.  Peripheral Bypass Surgery. This is a surgical procedure that reroutes the blood around a blocked artery to help improve blood flow. This type of procedure may be performed if Angioplasty or stent implants are not an option. SEEK IMMEDIATE MEDICAL CARE IF:   You develop pain or numbness in your arms or legs.  Your arm or leg turns cold, becomes blue in color.  You develop redness, warmth, swelling and pain in your arms or legs. MAKE SURE YOU:   Understand these instructions.  Will watch your condition.  Will get help right away if you are not doing well or get worse. Document Released: 07/10/2004 Document Revised: 08/25/2011 Document Reviewed: 06/06/2008 Exit[employer] Patient Information 2014 [employer].   Smoking Cessation Quitting smoking is important to your health and has many advantages. However, it is not always easy to quit since nicotine is a very addictive drug. Often times, people try 3  times or more before being able to quit. This document explains the best ways for you to prepare to quit smoking. Quitting takes hard work and a lot of effort, but you can do it. ADVANTAGES OF QUITTING SMOKING  You will live longer, feel better, and live better.  Your body will feel the impact of quitting smoking almost immediately.  Within 20 minutes, blood pressure decreases. Your pulse returns to its normal level.  After 8 hours, carbon monoxide levels in the blood return to normal. Your oxygen level increases.  After 24 hours, the chance of having a heart attack starts to decrease. Your breath, hair, and body stop smelling like smoke.  After 48 hours, damaged nerve endings begin to recover. Your sense of taste and smell improve.  After 72 hours, the body is virtually free of nicotine. Your bronchial tubes relax and breathing becomes easier.  After 2 to 12 weeks, lungs can hold more air. Exercise becomes easier and circulation improves.  The risk of having a heart attack, stroke, cancer, or lung disease is greatly reduced.  After 1 year, the risk of coronary heart disease is cut in half.  After 5 years, the risk of stroke falls to   the same as a nonsmoker.  After 10 years, the risk of lung cancer is cut in half and the risk of other cancers decreases significantly.  After 15 years, the risk of coronary heart disease drops, usually to the level of a nonsmoker.  If you are pregnant, quitting smoking will improve your chances of having a healthy baby.  The people you live with, especially any children, will be healthier.  You will have extra money to spend on things other than cigarettes. QUESTIONS TO THINK ABOUT BEFORE ATTEMPTING TO QUIT You may want to talk about your answers with your caregiver.  Why do you want to quit?  If you tried to quit in the past, what helped and what did not?  What will be the most difficult situations for you after you quit? How will you plan to  handle them?  Who can help you through the tough times? Your family? Friends? A caregiver?  What pleasures do you get from smoking? What ways can you still get pleasure if you quit? Here are some questions to ask your caregiver:  How can you help me to be successful at quitting?  What medicine do you think would be best for me and how should I take it?  What should I do if I need more help?  What is smoking withdrawal like? How can I get information on withdrawal? GET READY  Set a quit date.  Change your environment by getting rid of all cigarettes, ashtrays, matches, and lighters in your home, car, or work. Do not let people smoke in your home.  Review your past attempts to quit. Think about what worked and what did not. GET SUPPORT AND ENCOURAGEMENT You have a better chance of being successful if you have help. You can get support in many ways.  Tell your family, friends, and co-workers that you are going to quit and need their support. Ask them not to smoke around you.  Get individual, group, or telephone counseling and support. Programs are available at local hospitals and health centers. Call your local health department for information about programs in your area.  Spiritual beliefs and practices may help some smokers quit.  Download a "quit meter" on your computer to keep track of quit statistics, such as how long you have gone without smoking, cigarettes not smoked, and money saved.  Get a self-help book about quitting smoking and staying off of tobacco. LEARN NEW SKILLS AND BEHAVIORS  Distract yourself from urges to smoke. Talk to someone, go for a walk, or occupy your time with a task.  Change your normal routine. Take a different route to work. Drink tea instead of coffee. Eat breakfast in a different place.  Reduce your stress. Take a hot bath, exercise, or read a book.  Plan something enjoyable to do every day. Reward yourself for not smoking.  Explore  interactive web-based programs that specialize in helping you quit. GET MEDICINE AND USE IT CORRECTLY Medicines can help you stop smoking and decrease the urge to smoke. Combining medicine with the above behavioral methods and support can greatly increase your chances of successfully quitting smoking.  Nicotine replacement therapy helps deliver nicotine to your body without the negative effects and risks of smoking. Nicotine replacement therapy includes nicotine gum, lozenges, inhalers, nasal sprays, and skin patches. Some may be available over-the-counter and others require a prescription.  Antidepressant medicine helps people abstain from smoking, but how this works is unknown. This medicine is available by prescription.    Nicotinic receptor partial agonist medicine simulates the effect of nicotine in your brain. This medicine is available by prescription. Ask your caregiver for advice about which medicines to use and how to use them based on your health history. Your caregiver will tell you what side effects to look out for if you choose to be on a medicine or therapy. Carefully read the information on the package. Do not use any other product containing nicotine while using a nicotine replacement product.  RELAPSE OR DIFFICULT SITUATIONS Most relapses occur within the first 3 months after quitting. Do not be discouraged if you start smoking again. Remember, most people try several times before finally quitting. You may have symptoms of withdrawal because your body is used to nicotine. You may crave cigarettes, be irritable, feel very hungry, cough often, get headaches, or have difficulty concentrating. The withdrawal symptoms are only temporary. They are strongest when you first quit, but they will go away within 10 14 days. To reduce the chances of relapse, try to:  Avoid drinking alcohol. Drinking lowers your chances of successfully quitting.  Reduce the amount of caffeine you consume. Once you  quit smoking, the amount of caffeine in your body increases and can give you symptoms, such as a rapid heartbeat, sweating, and anxiety.  Avoid smokers because they can make you want to smoke.  Do not let weight gain distract you. Many smokers will gain weight when they quit, usually less than 10 pounds. Eat a healthy diet and stay active. You can always lose the weight gained after you quit.  Find ways to improve your mood other than smoking. FOR MORE INFORMATION  www.smokefree.gov  Document Released: 05/27/2001 Document Revised: 12/02/2011 Document Reviewed: 09/11/2011 [employer] Patient Information 2014 [employer].  

## 2013-08-19 NOTE — Progress Notes (Signed)
[facility] [place] HISTORY AND PHYSICAL -PAD  History of Present Illness Kelsey Hansen is a 48 y.o. female patient of Dr. Darrick Penna who is s/p Right common iliac artery and  Right external iliac artery percutaneous transluminal angioplasty with stent placement December 2008, and known right femoral to left femoral bypass graft occluded.  Pt. Does not seem to have claudication symptoms, has pain on soles of feet, chronic numbness on medial aspects both thighs since the 2008 fem-fem bypass, Dr. Madilyn Fireman and Joycelyn Rua, denies non healing ulcers on legs. She complains of right shoulder pain for a few days, difficult to raise right arm. Also complains of a knot in the muscle below her right scapula.  The patient denies New Surgical History. Had a large MI in 2007, denies any known strokes or TIA's. She reports she is eating healthy.  Pt Diabetic: No Pt smoker: smoker  (1/2 ppd x 35 yrs)  Pt meds include: Statin :No, taking red yeast rice; states she had arthralgias with Lipitor and Crestor Betablocker: Yes ASA: Yes Other anticoagulants/antiplatelets: Plavix  Past Medical History  Diagnosis Date  . Coronary atherosclerosis of native coronary artery     Multivessel  . NSTEMI (non-ST elevated myocardial infarction) 11/07    PTCA of distal circumflex 2007, residual 50% LAD and 80% PDA  . Brachial artery occlusion     Postcatheterization complication, required surgical repair  . Peripheral arterial disease     Right common and external iliac stent, right to left fem-fem bypass  . Hyperlipidemia   . Anxiety     Social History History  Substance Use Topics  . Smoking status: Former Smoker -- 0.50 packs/day for 30 years    Types: Cigarettes  . Smokeless tobacco: Never Used     Comment: doing Nicotrol inhaler   . Alcohol Use: No    Family History Family History  Problem Relation Age of Onset  . Diabetes Neg Hx   . Hypertension Neg Hx   . Coronary artery disease Neg  Hx   . Cancer Mother     Lung  . Heart disease Mother     Heart Disease before age 4  . Heart attack Mother 58  . Deep vein thrombosis Father   . Arthritis Father     Back  . Varicose Veins Father   . Cancer Sister     Breast    Past Surgical History  Procedure Laterality Date  . Coronary angioplasty  5/08    S/p right common iliac artery stent and right external iliac artery angioplasty  . Femoral bypass  7/08    Right to left; followed by right external iliac artery stent placed 12/08  . Sp [place] transcatheter occlusion      Occlusion off her fem-fem bypass with claudication in both legs  . Abis  9/11    .87 on the right and .58 on the left done by vascular surgery; felt pt could be managed medically without re-intervention  . Pr vein bypass graft,aorto-fem-pop    . Coronary artery bypass graft  2008    Dr. Madilyn Fireman    Allergies  Allergen Reactions  . Aleve [Naproxen] Palpitations  . Hydrocodone-Acetaminophen     REACTION: nausea and vomiting  . Oxycodone-Acetaminophen     REACTION: nausea and vomiting    Current Outpatient Prescriptions  Medication Sig Dispense Refill  . aspirin 81 MG tablet Take 81 mg by mouth daily.        . carvedilol (COREG)  12.5 MG tablet Take 1 tablet (12.5 mg total) by mouth 2 (two) times daily with a meal.  180 tablet  1  . isosorbide mononitrate (IMDUR) 60 MG 24 hr tablet TAKE 3 TABLETS BY MOUTH EVERY MORNING  90 tablet  3  . isosorbide mononitrate (IMDUR) 60 MG 24 hr tablet TAKE 3 TABLETS BY MOUTH EVERY MORNING  90 tablet  6  . nicotine (NICOTROL) 10 MG inhaler Inhale 1 puff into the lungs as needed for smoking cessation.      . nitroGLYCERIN (NITROSTAT) 0.4 MG SL tablet Place 1 tablet (0.4 mg total) under the tongue every 5 (five) minutes as needed. Don't exceed 3 doses in 15 minute period.  25 tablet  3  . PLAVIX 75 MG tablet TAKE 1 TABLET BY MOUTH EVERY DAY  30 tablet  6  . ranitidine (ZANTAC) 150 MG tablet Take 150 mg by mouth daily as  needed.        . Red Yeast Rice 600 MG CAPS Take 1,200 mg by mouth daily.         No current facility-administered medications for this visit.    ROS: See HPI for pertinent positives and negatives.   Physical Examination  Filed Vitals:   08/19/13 1302  BP: 118/73  Pulse: 67  Resp: 16   Filed Weights   08/19/13 1302  Weight: 134 lb (60.782 kg)   Body mass index is 24.5 kg/(m^2).  General: A&O x 3, WDWN. Gait: normal Eyes: PERRLA. Pulmonary: CTAB, without wheezes , rales or rhonchi. Cardiac: regular Rythm , without detected murmur.         Carotid Bruits Left Right   Negative Negative  Aorta is not palpable. Radial pulses: 2+ palpable and =                           VASCULAR EXAM: Extremities without ischemic changes  without Gangrene; without open wounds.                                                                                                          LE Pulses LEFT RIGHT       FEMORAL   Not palpable   palpable        POPLITEAL  not palpable   not palpable       POSTERIOR TIBIAL  not palpable    palpable        DORSALIS PEDIS      ANTERIOR TIBIAL not palpable   palpable    Abdomen: soft, NT, no masses. Skin: no rashes, no ulcers noted. Musculoskeletal: no muscle wasting or atrophy.  Neurologic: A&O X 3; Appropriate Affect ; SENSATION: normal; MOTOR FUNCTION:  moving all extremities equally, motor strength 5/5 throughout. Speech is fluent/normal. CN 2-12 intact.  Non-Invasive Vascular Imaging: DATE: 08/19/2013  DUPLEX SCAN OF BYPASS:  AORTO - ILIAC DUPLEX EVALUATION    INDICATION: Peripheral vascular disease     PREVIOUS INTERVENTION(S):  Right common iliac artery and  Right external iliac artery percutaneous  transluminal angioplasty with stent placement December 2008. Known right femoral to left femoral bypass graft occluded.    DUPLEX EXAM:      Peak Systolic Velocity (cm/s)  AORTA - Proximal 84  AORTA - Mid 63  AORTA - Distal 44    RIGHT   LEFT  Peak Systolic Velocity (cm/s) Ratio (if abnormal) Waveform  Peak Systolic Velocity (cm/s)  53 Stent T Common Iliac Artery - Proximal   84 Stent T Common Iliac Artery - Mid   135 Stent T Common Iliac Artery - Distal   92/180 Stent 2.0 T External Iliac Artery - Proximal   138/111 Stent T External Iliac Artery - Mid   345/159 3.1 T External Iliac Artery - Distal   457 3.4 M Internal Iliac Artery   1.03/0 Today's ABI / TBI 0.70/0  1.09/0.60 Previous ABI / TBI (04/19/2012 ) 0.80/0.38   Elevated velocities and ratios present involving the right mid/distal external iliac artery and internal iliac artery suggestive of greater than 50% stenosis. Elevated velocity and ratio at the proximal anastomosis of the right external iliac artery stent which may represent a greater than 50% stenosis.    Compared to the previous exam:  Stable right and decreased left ankle brachial indices with decreased toe brachial indices since previous study on 04/19/2012.    ASSESSMENT: Kelsey Hansen is a 48 y.o. female who is s/p Right common iliac artery and  Right external iliac artery percutaneous transluminal angioplasty with stent placement December 2008, and known right femoral to left femoral bypass graft occluded. ABI's are normal in the right leg and in the left leg indicate moderate arterial occlusive disease. At age 48 she has already had a large MI that was not amenable to stenting. Unfortunately she continues to smoke but seem motivated to attend the smoking cessation class, was unable to tolerate Chantix. She has severe stenoses involving the right mid/distal external iliac artery and internal iliac artery and the proximal anastomosis of the right external iliac artery stent.  PLAN:  I discussed in depth with the patient the nature of atherosclerosis, and emphasized the importance of maximal medical management including strict control of blood pressure, blood glucose, and lipid levels, obtaining  regular exercise, and cessation of smoking.  The patient is aware that without maximal medical management the underlying atherosclerotic disease process will progress, limiting the benefit of any interventions.  She was counseled re smoking cessation. Based on the patient's vascular studies and examination, pt will return to clinic in 2-4 weeks to follow up with Dr. Darrick PennaFields re the 457 and 345 velocities   involving the right mid/distal external iliac artery and internal iliac artery suggestive of greater than 50% stenosis. Elevated velocity and ratio at the proximal anastomosis of the right external iliac artery stent which may represent a greater than 50% stenosis.   The patient was given information about PAD including signs, symptoms, treatment, what symptoms should prompt the patient to seek immediate medical care, and risk reduction measures to take.  Charisse MarchSuzanne Zeferino Mounts, RN, MSN, FNP-C [facility] [place] Office Phone: [phone]  Clinic MD: Imogene BurnChen  08/19/2013 1:04 PM

## 2013-08-31 ENCOUNTER — Other Ambulatory Visit: Payer: Self-pay | Admitting: *Deleted

## 2013-08-31 MED ORDER — CLOPIDOGREL BISULFATE 75 MG PO TABS: 75.0000 mg | ORAL_TABLET | Freq: Every day | ORAL | Status: DC

## 2013-08-31 MED ORDER — ISOSORBIDE MONONITRATE ER 60 MG PO TB24: 180.0000 mg | ORAL_TABLET | Freq: Every day | ORAL | Status: DC

## 2013-09-14 ENCOUNTER — Encounter: Payer: Self-pay | Admitting: Vascular Surgery

## 2013-09-15 ENCOUNTER — Ambulatory Visit (INDEPENDENT_AMBULATORY_CARE_PROVIDER_SITE_OTHER): Payer: Medicare Other | Admitting: Vascular Surgery

## 2013-09-15 ENCOUNTER — Encounter: Payer: Self-pay | Admitting: Vascular Surgery

## 2013-09-15 ENCOUNTER — Ambulatory Visit (HOSPITAL_COMMUNITY)
Admission: RE | Admit: 2013-09-15 | Discharge: 2013-09-15 | Disposition: A | Discharge status: Home / Self Care | Payer: Medicare Other | Source: Ambulatory Visit | Attending: Vascular Surgery | Admitting: Vascular Surgery

## 2013-09-15 ENCOUNTER — Other Ambulatory Visit: Payer: Self-pay | Admitting: Vascular Surgery

## 2013-09-15 VITALS — BP 159/73 | HR 64 | Ht 62.0 in | Wt 139.6 lb

## 2013-09-15 DIAGNOSIS — M79609 Pain in unspecified limb: Secondary | ICD-10-CM | POA: Diagnosis not present

## 2013-09-15 DIAGNOSIS — R0989 Other specified symptoms and signs involving the circulatory and respiratory systems: Secondary | ICD-10-CM | POA: Diagnosis not present

## 2013-09-15 DIAGNOSIS — I739 Peripheral vascular disease, unspecified: Secondary | ICD-10-CM

## 2013-09-15 NOTE — Progress Notes (Signed)
[facility] [place] HISTORY AND PHYSICAL   History of Present Illness:  Patient is a 48 y.o. year old female who presents for follow-up evaluation for PAD.  She previously had right common iliac stent and right external iliac artery angioplasty by Dr. Madilyn FiremanHayes. This was in 2008. She also had a right to left femoral-femoral bypass at that time. This has been chronically occluded for several years.  She was noticed on recent surveillance duplex scan had increased velocities across her stent.She is on Plavix and Aspirin for antiplatelet therapy. Her atherosclerotic risk factors remain elevated cholesterol, smoking, and coronary artery disease.  These are all currently stable and followed by the primary care physician as well as her cardiolgist.  The patient currently has claudication symptoms that occur in both legs left greater than right but she does not really have any significant limitation.   The patient denies rest pain or ulcers on the feet.  She continues to smoke a half pack of cigarettes per day and greater than 3 minutes spent today regarding smoking cessation counseling.    Past Medical History  Diagnosis Date  . Coronary atherosclerosis of native coronary artery     Multivessel  . NSTEMI (non-ST elevated myocardial infarction) 11/07    PTCA of distal circumflex 2007, residual 50% LAD and 80% PDA  . Brachial artery occlusion     Postcatheterization complication, required surgical repair  . Peripheral arterial disease     Right common and external iliac stent, right to left fem-fem bypass  . Hyperlipidemia   . Anxiety     Past Surgical History  Procedure Laterality Date  . Coronary angioplasty  5/08    S/p right common iliac artery stent and right external iliac artery angioplasty  . Femoral bypass  7/08    Right to left; followed by right external iliac artery stent placed 12/08  . Sp [place] transcatheter occlusion      Occlusion off her fem-fem bypass with  claudication in both legs  . Abis  9/11    .87 on the right and .58 on the left done by vascular surgery; felt pt could be managed medically without re-intervention  . Pr vein bypass graft,aorto-fem-pop    . Coronary artery bypass graft  2008    Dr. Madilyn FiremanHayes    Review of Systems:  Neurologic: sensation in the feet is intact Cardiac:denies shortness of breath or chest pain Pulmonary: denies cough or wheeze  Social History History   Substance Use Topics   .  Smoking status:  Current Everyday Smoker -- 0.5 packs/day       Types:  Cigarettes   .  Smokeless tobacco:  Not on file   .  Alcohol Use:       Allergies    Allergies   Allergen  Reactions   .  Hydrocodone-Acetaminophen         REACTION: nausea and vomiting   .  Oxycodone-Acetaminophen         REACTION: nausea and vomiting        Current Outpatient Prescriptions on File Prior to Visit  Medication Sig Dispense Refill  . aspirin 81 MG tablet Take 81 mg by mouth daily.        . carvedilol (COREG) 12.5 MG tablet Take 1 tablet (12.5 mg total) by mouth 2 (two) times daily with a meal.  180 tablet  1  . clopidogrel (PLAVIX) 75 MG tablet Take 1 tablet (75 mg total) by mouth daily.  90 tablet  0  . isosorbide mononitrate (IMDUR) 60 MG 24 hr tablet Take 3 tablets (180 mg total) by mouth daily.  90 tablet  0  . nicotine (NICOTROL) 10 MG inhaler Inhale 1 puff into the lungs as needed for smoking cessation.      . nitroGLYCERIN (NITROSTAT) 0.4 MG SL tablet Place 1 tablet (0.4 mg total) under the tongue every 5 (five) minutes as needed. Don't exceed 3 doses in 15 minute period.  25 tablet  3  . ranitidine (ZANTAC) 150 MG tablet Take 150 mg by mouth daily as needed.        . Red Yeast Rice 600 MG CAPS Take 1,200 mg by mouth daily.         No current facility-administered medications on file prior to visit.    Physical Examination    Filed Vitals:   09/15/13 0935  BP: 159/73  Pulse: 64  Height: 5\' 2"  (1.575 m)  Weight: 139 lb  9.6 oz (63.322 kg)  SpO2: 100%    General:  Alert and oriented, no acute distress HEENT: Normal Neck: Left carotid bruit no JVD Pulmonary: Clear to auscultation bilaterally Cardiac: Regular Rate and Rhythm with 3/6 murmur Neurologic: Upper and lower extremity motor 5/5 and symmetric Extremities:  1-2+ right femoral pulse, absent left femoral pulse absent popliteal and pedal pulses  Skin: no ulcer or rash  DATA: Duplex ultrasound dated March 6 is reviewed today. Velocities across the external iliac artery are 345 cm/s. There are triphasic waveforms. ABI was 1.03, left ABI 0.7.  patient had a carotid duplex exam today due to a carotid bruit. This showed bilateral 40-60% carotid stenosis.  I reviewed and interpreted this study.  ASSESSMENT: Stable mild claudication in a patient with previous external iliac artery stenting chronically occluded femoral-femoral bypass with increasing velocities across the external iliac artery   PLAN: The patient will try to quit smoking. She will continue walking at least 30 minutes daily. She needs a followup carotid duplex scan in 6 months. She is scheduled for arteriogram to further evaluate her iliac arteries on 10/07/2013. If she has recurrent stenosis angioplasty or stenting will be considered. Risks benefits possible complications and procedure details were explained the patient today including but limited to bleeding vessel injury contrast reaction she understands and agrees to proceed  Fabienne Bruns, MD [facility] [place] Office: [phone] Pager: [phone]

## 2013-09-23 ENCOUNTER — Encounter: Payer: Self-pay | Admitting: Cardiology

## 2013-09-23 ENCOUNTER — Ambulatory Visit (INDEPENDENT_AMBULATORY_CARE_PROVIDER_SITE_OTHER): Payer: Medicare Other | Admitting: Cardiology

## 2013-09-23 VITALS — BP 134/79 | HR 74 | Ht 62.0 in | Wt 139.4 lb

## 2013-09-23 DIAGNOSIS — F17200 Nicotine dependence, unspecified, uncomplicated: Secondary | ICD-10-CM

## 2013-09-23 DIAGNOSIS — Z79899 Other long term (current) drug therapy: Secondary | ICD-10-CM

## 2013-09-23 DIAGNOSIS — E782 Mixed hyperlipidemia: Secondary | ICD-10-CM

## 2013-09-23 DIAGNOSIS — I2510 Atherosclerotic heart disease of native coronary artery without angina pectoris: Secondary | ICD-10-CM

## 2013-09-23 DIAGNOSIS — Z720 Tobacco use: Secondary | ICD-10-CM

## 2013-09-23 MED ORDER — PRAVASTATIN SODIUM 20 MG PO TABS: 20.0000 mg | ORAL_TABLET | Freq: Every evening | ORAL | Status: DC

## 2013-09-23 NOTE — Assessment & Plan Note (Signed)
Discussed smoking cessation again today. She has not been able to quit.

## 2013-09-23 NOTE — Progress Notes (Signed)
Clinical Summary [name] is a 48 y.o.female last seen in September 2014. She reports no accelerating angina symptoms, chronic shortness of breath. We reviewed her medications.  Last ischemic workup was via a Lexiscan Cardiolite in January 2012 with report indicating no active ischemic defects, LVEF 57%.  She has not been able to quit smoking for any significantly time. We discussed strategies for smoking cessation, and the critical importance for her health.  Unfortunately, she has had intolerance to statins in the past (prior patient of Dr. Andee Hansen), last cholesterol 238 with LDL 177. We explored this today. She tells that she is not able to take Crestor, Lipitor, or Zocor.  She is followed by Dr. Darrick Hansen with PAD, known right to left femoral bypass graft occlusion, status post right common iliac and right external iliac angioplasty with stent placement. Followup angiography is planned later this month.  She has no primary care physician.   Allergies  Allergen Reactions  . Aleve [Naproxen] Palpitations  . Hydrocodone-Acetaminophen     REACTION: nausea and vomiting  . Oxycodone-Acetaminophen     REACTION: nausea and vomiting    Current Outpatient Prescriptions  Medication Sig Dispense Refill  . aspirin 81 MG tablet Take 81 mg by mouth daily.        . carvedilol (COREG) 12.5 MG tablet Take 1 tablet (12.5 mg total) by mouth 2 (two) times daily with a meal.  180 tablet  1  . clopidogrel (PLAVIX) 75 MG tablet Take 1 tablet (75 mg total) by mouth daily.  90 tablet  0  . diphenhydrAMINE (SOMINEX) 25 MG tablet Take 25 mg by mouth as needed for sleep.      . isosorbide mononitrate (IMDUR) 60 MG 24 hr tablet Take 3 tablets (180 mg total) by mouth daily.  90 tablet  0  . nitroGLYCERIN (NITROSTAT) 0.4 MG SL tablet Place 1 tablet (0.4 mg total) under the tongue every 5 (five) minutes as needed. Don't exceed 3 doses in 15 minute period.  25 tablet  3  . ranitidine (ZANTAC) 150 MG tablet Take  150 mg by mouth daily as needed.        . Red Yeast Rice 600 MG CAPS Take 1,200 mg by mouth daily.         No current facility-administered medications for this visit.    Past Medical History  Diagnosis Date  . Coronary atherosclerosis of native coronary artery     Multivessel  . NSTEMI (non-ST elevated myocardial infarction) 11/07    PTCA of distal circumflex 2007, residual 50% LAD and 80% PDA  . Brachial artery occlusion     Postcatheterization complication, required surgical repair  . Peripheral arterial disease     Right common and external iliac stent, right to left fem-fem bypass  . Hyperlipidemia   . Anxiety     Past Surgical History  Procedure Laterality Date  . Femoral bypass  7/08    Right to left; followed by right external iliac artery stent placed 12/08  . Sp us transcatheter occlusion      Occlusion off her fem-fem bypass with claudication in both legs  . Pr vein bypass graft,aorto-fem-pop      Social History [name] reports that she has been smoking Cigarettes.  She has a 15 pack-year smoking history. She has never used smokeless tobacco. [name] reports that she does not drink alcohol.  Review of Systems No palpitations or syncope. Right shoulder pain. No cough, fevers or chills. Otherwise  negative except as outlined.  Physical Examination Filed Vitals:   09/23/13 1018  BP: 134/79  Pulse: 74   Filed Weights   09/23/13 1018  Weight: 139 lb 6.4 oz (63.231 kg)    Appears comfortable at rest.  HEENT: Conjunctiva and lids normal, oropharynx clear with poor dentition.  Neck: Supple, no elevated JVP or carotid bruits, no thyromegaly.  Lungs: Diminished but clear to auscultation, nonlabored breathing at rest.  Cardiac: Regular rate and rhythm, no S3 or significant systolic murmur, no pericardial rub.  Abdomen: Soft, nontender, bowel sounds present.  Extremities: No pitting edema, distal pulses significantly diminished..  Skin: Warm and dry.    Musculoskeletal: No kyphosis.  Neuropsychiatric: Alert and oriented x3, affect grossly appropriate.   Problem List and Plan   Coronary atherosclerosis of native coronary artery Symptomatically stable on medical therapy.  TOBACCO ABUSE Discussed smoking cessation again today. She has not been able to quit.  Mixed hyperlipidemia We discussed medical therapy and will try Pravachol 20 mg every other day to see if she can tolerate it. It would be optimal to get her LDL much better controlled. If she is able to stay on the medication, will recheck FLP and LFT in 3 months.  Essential hypertension, benign No changes made today.  PVD (peripheral vascular disease) with claudication Followed by Dr. Darrick Penna with angiography planned later this month.    Kelsey Hansen, M.D., F.A.C.C.

## 2013-09-23 NOTE — Patient Instructions (Signed)
Your physician recommends that you schedule a follow-up appointment in: 6 months. You will receive a reminder letter in the mail in about 4 months reminding you to call and schedule your appointment. If you don't receive this letter, please contact our office. Your physician has recommended you make the following change in your medication:  Start pravastatin 20 mg every other day. New prescription sent to your pharmacy. All other medications will remain the same. Your physician recommends that you have a FASTING lipid/liver profile in 3 months after starting pravastatin only if you are able to take this medication. Please call our office if you have to stop it.

## 2013-09-23 NOTE — Assessment & Plan Note (Signed)
Followed by Dr. Darrick PennaFields with angiography planned later this month.

## 2013-09-23 NOTE — Assessment & Plan Note (Signed)
No changes made today

## 2013-09-23 NOTE — Assessment & Plan Note (Signed)
We discussed medical therapy and will try Pravachol 20 mg every other day to see if she can tolerate it. It would be optimal to get her LDL much better controlled. If she is able to stay on the medication, will recheck FLP and LFT in 3 months.

## 2013-09-23 NOTE — Assessment & Plan Note (Signed)
Symptomatically stable on medical therapy. 

## 2013-09-28 ENCOUNTER — Telehealth: Payer: Self-pay | Admitting: *Deleted

## 2013-09-28 NOTE — Telephone Encounter (Signed)
Patient called today to cancel her aortogram with bilateral runoff which was scheduled for 10-07-13 with Dr. Darrick PennaFields. She states that "she wants to quit smoking first" and will call us back to reschedule. She realizes that this delay may impact her blood flow and claudication pain.

## 2013-10-07 ENCOUNTER — Encounter (HOSPITAL_COMMUNITY): Admission: RE | Payer: Self-pay | Source: Ambulatory Visit

## 2013-10-07 ENCOUNTER — Ambulatory Visit (HOSPITAL_COMMUNITY): Admission: RE | Admit: 2013-10-07 | Payer: Medicare Other | Source: Ambulatory Visit | Admitting: Vascular Surgery

## 2013-10-07 SURGERY — ABDOMINAL AORTAGRAM
Anesthesia: LOCAL

## 2013-10-11 ENCOUNTER — Other Ambulatory Visit: Payer: Self-pay | Admitting: Cardiology

## 2013-10-26 ENCOUNTER — Other Ambulatory Visit: Payer: Self-pay | Admitting: *Deleted

## 2013-10-26 MED ORDER — ISOSORBIDE MONONITRATE ER 60 MG PO TB24: 180.0000 mg | ORAL_TABLET | Freq: Every day | ORAL | Status: DC

## 2013-12-13 ENCOUNTER — Other Ambulatory Visit: Payer: Self-pay | Admitting: Cardiology

## 2013-12-19 ENCOUNTER — Other Ambulatory Visit: Payer: Self-pay | Admitting: *Deleted

## 2013-12-19 MED ORDER — NITROGLYCERIN 0.4 MG SL SUBL: 0.4000 mg | SUBLINGUAL_TABLET | SUBLINGUAL | Status: DC | PRN

## 2014-01-14 ENCOUNTER — Other Ambulatory Visit: Payer: Self-pay | Admitting: Cardiology

## 2014-01-16 ENCOUNTER — Other Ambulatory Visit: Payer: Self-pay | Admitting: Cardiology

## 2014-03-28 ENCOUNTER — Encounter: Payer: Self-pay | Admitting: Cardiology

## 2014-03-28 ENCOUNTER — Ambulatory Visit (INDEPENDENT_AMBULATORY_CARE_PROVIDER_SITE_OTHER): Payer: Medicare Other | Admitting: Cardiology

## 2014-03-28 VITALS — BP 135/84 | HR 77 | Ht 62.0 in | Wt 149.0 lb

## 2014-03-28 DIAGNOSIS — I2510 Atherosclerotic heart disease of native coronary artery without angina pectoris: Secondary | ICD-10-CM

## 2014-03-28 DIAGNOSIS — Z889 Allergy status to unspecified drugs, medicaments and biological substances status: Secondary | ICD-10-CM

## 2014-03-28 DIAGNOSIS — E782 Mixed hyperlipidemia: Secondary | ICD-10-CM

## 2014-03-28 DIAGNOSIS — Z789 Other specified health status: Secondary | ICD-10-CM

## 2014-03-28 DIAGNOSIS — Z5181 Encounter for therapeutic drug level monitoring: Secondary | ICD-10-CM

## 2014-03-28 NOTE — Assessment & Plan Note (Signed)
Followed by Dr. Fields. 

## 2014-03-28 NOTE — Assessment & Plan Note (Signed)
We continue to discuss strategies for smoking cessation.

## 2014-03-28 NOTE — Assessment & Plan Note (Signed)
Continue medical therapy and observation. Recommended diet and exercise regimen. ECG reviewed and stable.

## 2014-03-28 NOTE — Assessment & Plan Note (Signed)
Last LDL 177. She has a history of statin intolerance now including Crestor, Lipitor, Zocor, and Pravachol. Followup FLP, and referral being made to the lipid clinic.

## 2014-03-28 NOTE — Patient Instructions (Signed)
Your physician recommends that you schedule a follow-up appointment in: 6 months. You will receive a reminder letter in the mail in about 4 months reminding you to call and schedule your appointment. If you don't receive this letter, please contact our office. Your physician recommends that you continue on your current medications as directed. Please refer to the Current Medication list given to you today. You are being referred to the Lipid Clinic. Your physician recommends that you have a FASTING lipid/liver profile completed.

## 2014-03-28 NOTE — Progress Notes (Signed)
Clinical Summary [name] is a 48 y.o.female last seen in April. She presents for a routine visit, reports no angina symptoms. ECG today shows normal sinus rhythm.  Last ischemic workup was via a Lexiscan Cardiolite in January 2012 with report indicating no active ischemic defects, LVEF 57%.  She has had intolerance to statins in the past (prior patient of Dr. Andee Hansen), last cholesterol 238 with LDL 177. She hais not able to take Crestor, Lipitor, or Zocor. We tried low-dose Pravachol at the last visit. No followup lab work as yet. She tells me that she was not able to take Pravachol D2 muscle achiness.  She has not been able to quit smoking. She was able to cut back to about a half pack per day. We discussed strategies for smoking cessation again.   Allergies  Allergen Reactions  . Aleve [Naproxen] Palpitations  . Hydrocodone-Acetaminophen     REACTION: nausea and vomiting  . Oxycodone-Acetaminophen     REACTION: nausea and vomiting    Current Outpatient Prescriptions  Medication Sig Dispense Refill  . aspirin 81 MG tablet Take 81 mg by mouth daily.        . carvedilol (COREG) 12.5 MG tablet TAKE 1 TABLET BY MOUTH TWICE A DAY  180 tablet  1  . diphenhydrAMINE (SOMINEX) 25 MG tablet Take 25 mg by mouth as needed for sleep.      . isosorbide mononitrate (IMDUR) 60 MG 24 hr tablet Take 3 tablets (180 mg total) by mouth daily.  90 tablet  6  . nitroGLYCERIN (NITROSTAT) 0.4 MG SL tablet Place 1 tablet (0.4 mg total) under the tongue every 5 (five) minutes as needed. Don't exceed 3 doses in 15 minute period.  25 tablet  3  . PLAVIX 75 MG tablet TAKE ONE TABLET BY MOUTH DAILY  30 tablet  3  . ranitidine (ZANTAC) 150 MG tablet Take 150 mg by mouth daily as needed.        . Red Yeast Rice 600 MG CAPS Take 1,200 mg by mouth daily.         No current facility-administered medications for this visit.    Past Medical History  Diagnosis Date  . Coronary atherosclerosis of native coronary  artery     Multivessel  . NSTEMI (non-ST elevated myocardial infarction) 11/07    PTCA of distal circumflex 2007, residual 50% LAD and 80% PDA  . Brachial artery occlusion     Postcatheterization complication, required surgical repair  . Peripheral arterial disease     Right common and external iliac stent, right to left fem-fem bypass  . Hyperlipidemia   . Anxiety     Past Surgical History  Procedure Laterality Date  . Femoral bypass  7/08    Right to left; followed by right external iliac artery stent placed 12/08  . Sp us transcatheter occlusion      Occlusion off her fem-fem bypass with claudication in both legs  . Pr vein bypass graft,aorto-fem-pop      Social History [name] reports that she has been smoking Cigarettes.  She has a 15 pack-year smoking history. She has never used smokeless tobacco. [name] reports that she does not drink alcohol.  Review of Systems No progressive claudication symptoms. No palpitations or syncope. Stable appetite. Other systems reviewed and negative except as outlined.  Physical Examination Filed Vitals:   03/28/14 0914  BP: 135/84  Pulse: 77   Filed Weights   03/28/14 0914  Weight: 149  lb (67.586 kg)    Appears comfortable at rest.  HEENT: Conjunctiva and lids normal, oropharynx clear with poor dentition.  Neck: Supple, no elevated JVP or carotid bruits, no thyromegaly.  Lungs: Diminished but clear to auscultation, nonlabored breathing at rest.  Cardiac: Regular rate and rhythm, no S3 or significant systolic murmur, no pericardial rub.  Abdomen: Soft, nontender, bowel sounds present.  Extremities: No pitting edema, distal pulses significantly diminished..  Skin: Warm and dry.  Musculoskeletal: No kyphosis.  Neuropsychiatric: Alert and oriented x3, affect grossly appropriate.   Problem List and Plan   Coronary atherosclerosis of native coronary artery Continue medical therapy and observation. Recommended diet and exercise  regimen. ECG reviewed and stable.  TOBACCO ABUSE We continue to discuss strategies for smoking cessation.  Mixed hyperlipidemia Last LDL 177. She has a history of statin intolerance now including Crestor, Lipitor, Zocor, and Pravachol. Followup FLP, and referral being made to the lipid clinic.  Essential hypertension, benign No change to antihypertensive regimen.  PVD (peripheral vascular disease) with claudication Followed by Dr. Darrick Hansen.     Kelsey Hansen, M.D., F.A.C.C.

## 2014-03-28 NOTE — Assessment & Plan Note (Signed)
No change to antihypertensive regimen. 

## 2014-04-03 ENCOUNTER — Encounter: Payer: Self-pay | Admitting: Cardiology

## 2014-04-04 ENCOUNTER — Telehealth: Payer: Self-pay | Admitting: *Deleted

## 2014-04-04 NOTE — Telephone Encounter (Signed)
Patient informed. 

## 2014-04-04 NOTE — Telephone Encounter (Signed)
Message copied by Eustace MooreANDERSON, Arkin Imran M on Tue Apr 04, 2014  9:33 AM ------      Message from: MCDOWELL, Illene BolusSAMUEL G      Created: Wed Mar 29, 2014 11:15 AM       Reviewed. LFTs normal. LDL 181. She has already been referred to the lipid clinic with history of multiple statin intolerances. ------

## 2014-04-10 ENCOUNTER — Other Ambulatory Visit: Payer: Self-pay | Admitting: Cardiology

## 2014-04-11 ENCOUNTER — Ambulatory Visit: Payer: Medicare Other | Admitting: Pharmacist

## 2014-05-12 ENCOUNTER — Other Ambulatory Visit: Payer: Self-pay | Admitting: Cardiology

## 2014-05-17 ENCOUNTER — Other Ambulatory Visit: Payer: Self-pay | Admitting: Cardiology

## 2014-05-24 ENCOUNTER — Other Ambulatory Visit: Payer: Self-pay | Admitting: Cardiology

## 2014-09-27 ENCOUNTER — Other Ambulatory Visit: Payer: Self-pay | Admitting: *Deleted

## 2014-09-27 MED ORDER — NITROGLYCERIN 0.4 MG SL SUBL: 0.4000 mg | SUBLINGUAL_TABLET | SUBLINGUAL | Status: DC | PRN

## 2014-10-04 ENCOUNTER — Encounter: Payer: Self-pay | Admitting: Cardiology

## 2014-10-04 ENCOUNTER — Ambulatory Visit (INDEPENDENT_AMBULATORY_CARE_PROVIDER_SITE_OTHER): Payer: Medicare Other | Admitting: Cardiology

## 2014-10-04 VITALS — BP 128/80 | HR 82 | Ht 62.0 in | Wt 150.0 lb

## 2014-10-04 DIAGNOSIS — E782 Mixed hyperlipidemia: Secondary | ICD-10-CM | POA: Diagnosis not present

## 2014-10-04 DIAGNOSIS — Z720 Tobacco use: Secondary | ICD-10-CM

## 2014-10-04 DIAGNOSIS — Z889 Allergy status to unspecified drugs, medicaments and biological substances status: Secondary | ICD-10-CM | POA: Diagnosis not present

## 2014-10-04 DIAGNOSIS — I2510 Atherosclerotic heart disease of native coronary artery without angina pectoris: Secondary | ICD-10-CM

## 2014-10-04 DIAGNOSIS — I739 Peripheral vascular disease, unspecified: Secondary | ICD-10-CM

## 2014-10-04 DIAGNOSIS — Z789 Other specified health status: Secondary | ICD-10-CM

## 2014-10-04 NOTE — Progress Notes (Signed)
Cardiology Office Note  Date: 10/04/2014   ID: Kelsey Islamracy L Mutchler, DOB 1965/08/28, MRN [id number]  PCP: No primary care provider on file.  Primary Cardiologist: Nona DellSamuel Sadiyah Kangas, MD   Chief Complaint  Patient presents with  . Coronary Artery Disease  . PAD  . Hyperlipidemia    History of Present Illness: Kelsey Hansen is a 49 y.o. female last seen in October 2015. She presents for a routine visit today. She denies any angina symptoms with typical activities. Does report leg claudication, right worse than left, particularly when walking uphill. She tells me that she has been trying to stop smoking, is down to "10 cigarettes" a day. She has not been able to quit any longer than about 6 months over the years. We discussed strategies for smoking cessation.  He has a history of statin intolerance including Crestor, Lipitor, Zocor, and Pravachol. Last lipid panel is outlined below. I referred her to the lipid clinic for further assessment, however she never presented. We discussed this again today, she states that she would prefer to hold off further evaluation, we did address dietary modification. She tells that she also wants to try flaxseed or red yeast rice. I did try to impress upon her the importance of getting her lipids under better control as it relates to progression in atherosclerosis.  PAD has been followed by Dr. Darrick PennaFields, she has not been seen since April of last year. I offered to make her a follow-up visit with him.  She does not have a primary care physician. I explained that she needs to establish with a primary care provider for general health maintenance concerns.  Past Medical History  Diagnosis Date  . Coronary atherosclerosis of native coronary artery     Multivessel  . NSTEMI (non-ST elevated myocardial infarction) 11/07    PTCA of distal circumflex 2007, residual 50% LAD and 80% PDA  . Brachial artery occlusion     Postcatheterization complication, required surgical  repair  . Peripheral arterial disease     Right common and external iliac stent, right to left fem-fem bypass  . Hyperlipidemia   . Anxiety     Past Surgical History  Procedure Laterality Date  . Femoral bypass  7/08    Right to left; followed by right external iliac artery stent placed 12/08  . Sp us transcatheter occlusion      Occlusion off her fem-fem bypass with claudication in both legs  . Pr vein bypass graft,aorto-fem-pop      Current Outpatient Prescriptions  Medication Sig Dispense Refill  . aspirin 81 MG tablet Take 81 mg by mouth daily.      . carvedilol (COREG) 12.5 MG tablet TAKE 1 TABLET BY MOUTH TWICE A DAY 180 tablet 3  . cyclobenzaprine (FLEXERIL) 10 MG tablet Take 10 mg by mouth 3 (three) times daily as needed for muscle spasms.    . isosorbide mononitrate (IMDUR) 60 MG 24 hr tablet TAKE 3 TABLETS BY MOUTH EVERY DAY 90 tablet 6  . nitroGLYCERIN (NITROSTAT) 0.4 MG SL tablet Place 1 tablet (0.4 mg total) under the tongue every 5 (five) minutes as needed. Don't exceed 3 doses in 15 minute period. 25 tablet 3  . PLAVIX 75 MG tablet TAKE ONE TABLET BY MOUTH DAILY 30 tablet 6  . ranitidine (ZANTAC) 150 MG tablet Take 150 mg by mouth daily as needed.      . Red Yeast Rice 600 MG CAPS Take 1,200 mg by mouth daily.  No current facility-administered medications for this visit.    Allergies:  Aleve; Hydrocodone-acetaminophen; and Oxycodone-acetaminophen   Social History: The patient  reports that she has been smoking Cigarettes.  She has a 15 pack-year smoking history. She has never used smokeless tobacco. She reports that she does not drink alcohol or use illicit drugs.   ROS:  Please see the history of present illness. Otherwise, complete review of systems is positive for claudication as noted.  All other systems are reviewed and negative.   Physical Exam: VS:  BP 128/80 mmHg  Pulse 82  Ht  (1.575 m)  Wt 150 lb (68.04 kg)  BMI 27.43 kg/m2  SpO2 94%, BMI  Body mass index is 27.43 kg/(m^2).  Wt Readings from Last 3 Encounters:  10/04/14 150 lb (68.04 kg)  03/28/14 149 lb (67.586 kg)  09/23/13 139 lb 6.4 oz (63.231 kg)     Appears comfortable at rest.  HEENT: Conjunctiva and lids normal, oropharynx clear with poor dentition.  Neck: Supple, no elevated JVP or carotid bruits, no thyromegaly.  Lungs: Diminished but clear to auscultation, nonlabored breathing at rest.  Cardiac: Regular rate and rhythm, no S3 or significant systolic murmur, no pericardial rub.  Abdomen: Soft, nontender, bowel sounds present.  Extremities: No pitting edema, distal pulses diminished..  Skin: Warm and dry.  Musculoskeletal: No kyphosis.  Neuropsychiatric: Alert and oriented x3, affect grossly appropriate.   ECG: ECG is not ordered today. Tracing from October 2015 showed sinus bradycardia.   Recent Labwork:  03/28/2014: AST 20, ALT 14, triglycerides 63, cholesterol 240, HDL 46, LDL 181  Other Studies Reviewed Today:  Lexiscan Cardiolite in January 2012 reported no active ischemic defects, LVEF 57%.  Assessment and Plan:  1. Multivessel CAD status post remote angioplasty of the distal circumflex with residual moderate disease within the LAD, also branch vessel PDA disease. She is not reporting any angina symptoms, had a negative ischemic workup in 2012. She is to continue on aspirin, Plavix, Coreg, Imdur, and as needed nitroglycerin. No changes were made today.  2. Hyperlipidemia with statin intolerance, last LDL 181. She declines referral to the lipid clinic. I did try to stress to her the importance of more aggressive lipid control in light of her atherosclerotic disease. We discussed dietary modifications. She also states that she wants to try either flaxseed or red yeast rice. We did discuss other medication options.  3. Peripheral arterial disease with claudication. Stressed the critical importance of smoking cessation, also made a follow-up  visit with Dr. Darrick Penna.  4. Ongoing tobacco abuse. We discussed importance of smoking cessation.  5. I once again asked her to obtain a primary care provider for general health maintenance concerns.  Current medicines were reviewed with the patient today.   Orders Placed This Encounter  Procedures  . Ambulatory referral to Vascular Surgery    Disposition: FU with me in 6 months.   Signed, Jonelle Sidle, MD, [facility] 10/04/2014 9:24 AM    [facility] [address] [place], [place], [place] [address] Phone: [phone]; Fax: [phone]

## 2014-10-04 NOTE — Patient Instructions (Signed)
Your physician recommends that you continue on your current medications as directed. Please refer to the Current Medication list given to you today. Your physician recommends that you a FASTING lipid profile in 6 months just before your next visit. You will receive a reminder letter in the mail in about 4 months reminding you to call and schedule your appointment. If you don't receive this letter, please contact our office.  You have been referred to Dr. Darrick PennaFields. Please get an appointment with a family doctor. Your physician recommends that you schedule a follow-up appointment in: 6 months. You will receive a reminder letter in the mail in about 4 months reminding you to call and schedule your appointment. If you don't receive this letter, please contact our office.

## 2014-10-11 ENCOUNTER — Encounter: Payer: Self-pay | Admitting: Vascular Surgery

## 2014-10-12 ENCOUNTER — Other Ambulatory Visit: Payer: Self-pay

## 2014-10-12 DIAGNOSIS — M79673 Pain in unspecified foot: Secondary | ICD-10-CM

## 2014-10-25 ENCOUNTER — Encounter: Payer: Self-pay | Admitting: Vascular Surgery

## 2014-10-26 ENCOUNTER — Encounter: Payer: Self-pay | Admitting: Vascular Surgery

## 2014-10-26 ENCOUNTER — Ambulatory Visit (INDEPENDENT_AMBULATORY_CARE_PROVIDER_SITE_OTHER): Payer: Medicare Other | Admitting: Vascular Surgery

## 2014-10-26 ENCOUNTER — Other Ambulatory Visit: Payer: Self-pay | Admitting: *Deleted

## 2014-10-26 ENCOUNTER — Ambulatory Visit (HOSPITAL_COMMUNITY)
Admission: RE | Admit: 2014-10-26 | Discharge: 2014-10-26 | Disposition: A | Discharge status: Home / Self Care | Payer: Medicare Other | Source: Ambulatory Visit | Attending: Vascular Surgery | Admitting: Vascular Surgery

## 2014-10-26 VITALS — BP 149/85 | HR 62 | Ht 62.0 in | Wt 148.0 lb

## 2014-10-26 DIAGNOSIS — M79673 Pain in unspecified foot: Secondary | ICD-10-CM

## 2014-10-26 DIAGNOSIS — I6523 Occlusion and stenosis of bilateral carotid arteries: Secondary | ICD-10-CM

## 2014-10-26 DIAGNOSIS — I739 Peripheral vascular disease, unspecified: Secondary | ICD-10-CM

## 2014-10-26 DIAGNOSIS — F17210 Nicotine dependence, cigarettes, uncomplicated: Secondary | ICD-10-CM | POA: Diagnosis not present

## 2014-10-26 DIAGNOSIS — R200 Anesthesia of skin: Secondary | ICD-10-CM | POA: Insufficient documentation

## 2014-10-26 NOTE — Progress Notes (Signed)
[facility] [place] HISTORY AND PHYSICAL   History of Present Illness:  Patient is a 49 y.o. year old female who presents for follow-up evaluation for PAD.  She previously had right common iliac stent and right external iliac artery angioplasty by Dr. Madilyn FiremanHayes. This was in 2008. She also had a right to left femoral-femoral bypass at that time. This has been chronically occluded for several years.  She is on Plavix and Aspirin for antiplatelet therapy. Her atherosclerotic risk factors remain elevated cholesterol, smoking, and coronary artery disease.  These are all currently stable and followed by the primary care physician as well as her cardiolgist.  The patient currently has claudication symptoms that occur in both legs left greater than right but she does not really have any significant limitation.   The patient denies rest pain or ulcers on the feet.  She continues to smoke a half pack of cigarettes per day and greater than 3 minutes spent today regarding smoking cessation counseling.    Past Medical History  Diagnosis Date  . Coronary atherosclerosis of native coronary artery     Multivessel  . NSTEMI (non-ST elevated myocardial infarction) 11/07    PTCA of distal circumflex 2007, residual 50% LAD and 80% PDA  . Brachial artery occlusion     Postcatheterization complication, required surgical repair  . Peripheral arterial disease     Right common and external iliac stent, right to left fem-fem bypass  . Hyperlipidemia   . Anxiety    Past Surgical History  Procedure Laterality Date  . Femoral bypass  7/08    Right to left; followed by right external iliac artery stent placed 12/08  . Sp us transcatheter occlusion      Occlusion off her fem-fem bypass with claudication in both legs  . Pr vein bypass graft,aorto-fem-pop      Review of Systems:  Neurologic: sensation in the feet is intact Cardiac:denies shortness of breath or chest pain Pulmonary: denies cough or  wheeze  Social History History    Substance Use Topics    .   Smoking status:   Current Everyday Smoker -- 0.5 packs/day          Types:   Cigarettes    .   Smokeless tobacco:   Not on file    .   Alcohol Use:         Allergies    Allergies    Allergen   Reactions    .   Hydrocodone-Acetaminophen             REACTION: nausea and vomiting    .   Oxycodone-Acetaminophen             REACTION: nausea and vomiting     Current Outpatient Prescriptions on File Prior to Visit  Medication Sig Dispense Refill  . aspirin 81 MG tablet Take 81 mg by mouth daily.      . carvedilol (COREG) 12.5 MG tablet TAKE 1 TABLET BY MOUTH TWICE A DAY 180 tablet 3  . cyclobenzaprine (FLEXERIL) 10 MG tablet Take 10 mg by mouth 3 (three) times daily as needed for muscle spasms.    . isosorbide mononitrate (IMDUR) 60 MG 24 hr tablet TAKE 3 TABLETS BY MOUTH EVERY DAY 90 tablet 6  . nitroGLYCERIN (NITROSTAT) 0.4 MG SL tablet Place 1 tablet (0.4 mg total) under the tongue every 5 (five) minutes as needed. Don't exceed 3 doses in 15 minute period. 25 tablet 3  .  PLAVIX 75 MG tablet TAKE ONE TABLET BY MOUTH DAILY 30 tablet 6  . ranitidine (ZANTAC) 150 MG tablet Take 150 mg by mouth daily as needed.      . Red Yeast Rice 600 MG CAPS Take 1,200 mg by mouth daily.       No current facility-administered medications on file prior to visit.    Physical Examination    Filed Vitals:   10/26/14 0938  BP: 149/85  Pulse: 62  Height: 5\' 2"  (1.575 m)  Weight: 148 lb (67.132 kg)  SpO2: 97%    General:  Alert and oriented, no acute distress HEENT: Normal Neck: Left carotid bruit no JVD Pulmonary: Clear to auscultation bilaterally Cardiac: Regular Rate and Rhythm with 3/6 murmur Neurologic: Upper and lower extremity motor 5/5 and symmetric Extremities:  1-2+ right femoral pulse, absent left femoral pulse absent popliteal and pedal pulses   Skin: no ulcer or rash  DATA: Duplex ultrasound dated March 6 is reviewed  today. Velocities across the external iliac artery are 345 cm/s. There are triphasic waveforms. ABI was .94, left ABI 0.7.    I reviewed and interpreted this study.  ASSESSMENT: Stable mild claudication in a patient with previous external iliac artery stenting chronically occluded femoral-femoral bypass   PLAN: The patient will try to quit smoking. She will continue walking at least 30 minutes daily. She will follow-up in one year with repeat ABIs in a carotid duplex scan.   Fabienne Brunsharles Ronn Smolinsky, MD [facility] [place] Office: [phone] Pager: [phone]

## 2014-12-14 ENCOUNTER — Other Ambulatory Visit: Payer: Self-pay | Admitting: Cardiology

## 2014-12-15 ENCOUNTER — Other Ambulatory Visit: Payer: Self-pay | Admitting: Cardiology

## 2014-12-23 ENCOUNTER — Other Ambulatory Visit: Payer: Self-pay | Admitting: Cardiology

## 2015-03-27 ENCOUNTER — Other Ambulatory Visit: Payer: Self-pay | Admitting: Cardiology

## 2015-04-04 ENCOUNTER — Other Ambulatory Visit: Payer: Self-pay | Admitting: Cardiology

## 2015-04-11 ENCOUNTER — Telehealth: Payer: Self-pay | Admitting: *Deleted

## 2015-04-11 DIAGNOSIS — E782 Mixed hyperlipidemia: Secondary | ICD-10-CM

## 2015-04-11 NOTE — Telephone Encounter (Signed)
Patient advised that [facility] could have [facility] or [facility]. Lab order mailed to patient.

## 2015-04-11 NOTE — Telephone Encounter (Signed)
-----   Message from Geraldine ContrasStephanie R Smith sent at 04/11/2015  9:59 AM EDT ----- Regarding: Lipid panel labs Need lipid panel before her Nov appt  Please call patient to discuss where she will have done

## 2015-05-03 ENCOUNTER — Other Ambulatory Visit (HOSPITAL_COMMUNITY)
Admission: RE | Admit: 2015-05-03 | Discharge: 2015-05-03 | Disposition: A | Discharge status: Home / Self Care | Payer: Medicare Other | Source: Ambulatory Visit | Attending: Cardiology | Admitting: Cardiology

## 2015-05-03 DIAGNOSIS — E782 Mixed hyperlipidemia: Secondary | ICD-10-CM | POA: Diagnosis present

## 2015-05-03 LAB — LIPID PANEL
CHOL/HDL RATIO: 5.3 ratio
CHOLESTEROL: 232 mg/dL — AB (ref 0–200)
HDL: 44 mg/dL (ref >40)
LDL CALC: 167 mg/dL — AB (ref 0–99)
TRIGLYCERIDES: 107 mg/dL (ref <150)
VLDL: 21 mg/dL (ref 0–40)

## 2015-05-09 ENCOUNTER — Encounter: Payer: Self-pay | Admitting: Cardiology

## 2015-05-09 ENCOUNTER — Telehealth: Payer: Self-pay | Admitting: *Deleted

## 2015-05-09 ENCOUNTER — Encounter: Payer: Medicare Other | Admitting: Cardiology

## 2015-05-09 NOTE — Telephone Encounter (Signed)
Patient informed. 

## 2015-05-09 NOTE — Progress Notes (Signed)
Patient canceled.  This encounter was created in error - please disregard. 

## 2015-05-09 NOTE — Telephone Encounter (Signed)
-----   Message from Jonelle SidleSamuel G McDowell, MD sent at 05/03/2015 12:24 PM EST ----- Reviewed results. Total cholesterol 232 and LDL 167. Please see previous note, she has significant statin intolerance, has declined referral to the lipid clinic. We can continue to discuss this at office follow-up.

## 2015-06-01 ENCOUNTER — Encounter: Payer: Self-pay | Admitting: Cardiology

## 2015-06-01 ENCOUNTER — Ambulatory Visit (INDEPENDENT_AMBULATORY_CARE_PROVIDER_SITE_OTHER): Payer: Medicare Other | Admitting: Cardiology

## 2015-06-01 VITALS — BP 110/72 | HR 57 | Ht 62.0 in | Wt 155.0 lb

## 2015-06-01 DIAGNOSIS — Z789 Other specified health status: Secondary | ICD-10-CM

## 2015-06-01 DIAGNOSIS — I739 Peripheral vascular disease, unspecified: Secondary | ICD-10-CM | POA: Diagnosis not present

## 2015-06-01 DIAGNOSIS — Z720 Tobacco use: Secondary | ICD-10-CM

## 2015-06-01 DIAGNOSIS — Z889 Allergy status to unspecified drugs, medicaments and biological substances status: Secondary | ICD-10-CM

## 2015-06-01 DIAGNOSIS — E782 Mixed hyperlipidemia: Secondary | ICD-10-CM | POA: Diagnosis not present

## 2015-06-01 DIAGNOSIS — I10 Essential (primary) hypertension: Secondary | ICD-10-CM | POA: Diagnosis not present

## 2015-06-01 DIAGNOSIS — I2510 Atherosclerotic heart disease of native coronary artery without angina pectoris: Secondary | ICD-10-CM | POA: Diagnosis not present

## 2015-06-01 DIAGNOSIS — I6523 Occlusion and stenosis of bilateral carotid arteries: Secondary | ICD-10-CM

## 2015-06-01 NOTE — Patient Instructions (Signed)
Your physician recommends that you continue on your current medications as directed. Please refer to the Current Medication list given to you today. Your physician recommends that you schedule a follow-up appointment in: 6 months. You will receive a reminder letter in the mail in about 4 months reminding you to call and schedule your appointment. If you don't receive this letter, please contact our office. Please keep your follow up appointment with Dr. Darrick PennaFields.

## 2015-06-01 NOTE — Progress Notes (Signed)
Cardiology Office Note  Date: 06/01/2015   ID: Kelsey Islamracy L Pickney, DOB 23-Sep-1965, MRN [id number]  PCP: No primary care provider on file.  Primary Cardiologist: Nona DellSamuel McDowell, MD   Chief Complaint  Patient presents with  . Coronary Artery Disease    History of Present Illness: Kelsey Hansen is a 49 y.o. female last seen in April. She presents for a routine follow-up visit. Fortunately, she does not report any significant angina symptoms or nitroglycerin use. She still struggles trying to quit smoking cigarettes, has cut back some but this is usually on an intermittent basis. She still does not have a primary care provider, but has been seeing someone in urgent care close to her home.  She has a history of known statin intolerance as detailed in the previous note, declined referral to the lipid clinic for other options. We have discussed dietary modification and other medical treatment options. She has had a hard time settling on any particular regimen on a consistent basis, thinks that she might try flaxseed next.  ECG today shows sinus rhythm with left atrial enlargement.  She does report claudication symptoms, right worse than left. She saw Dr. Darrick PennaFields back in May with an anticipated annual visit.   Past Medical History  Diagnosis Date  . Coronary atherosclerosis of native coronary artery     Multivessel  . NSTEMI (non-ST elevated myocardial infarction) (HCC) 11/07    PTCA of distal circumflex 2007, residual 50% LAD and 80% PDA  . Brachial artery occlusion [facility](HCC)     Postcatheterization complication, required surgical repair  . Peripheral arterial disease (HCC)     Right common and external iliac stent, right to left fem-fem bypass  . Hyperlipidemia   . Anxiety     Past Surgical History  Procedure Laterality Date  . Femoral bypass  7/08    Right to left; followed by right external iliac artery stent placed 12/08  . Sp us transcatheter occlusion      Occlusion off her  fem-fem bypass with claudication in both legs  . Pr vein bypass graft,aorto-fem-pop      Current Outpatient Prescriptions  Medication Sig Dispense Refill  . aspirin 81 MG tablet Take 81 mg by mouth daily.      . carvedilol (COREG) 12.5 MG tablet TAKE 1 TABLET BY MOUTH TWICE A DAY 180 tablet 0  . isosorbide mononitrate (IMDUR) 60 MG 24 hr tablet TAKE 3 TABLETS BY MOUTH EVERY DAY 90 tablet 6  . montelukast (SINGULAIR) 10 MG tablet TAKE 1 TABLET BY MOUTH IN THE EVENING *NOT COVERED*  5  . NITROSTAT 0.4 MG SL tablet PLACE 1 TABLET UNDER TONGUE EVERY 5 MINUTES AS NEEDED. 25 tablet 3  . PLAVIX 75 MG tablet TAKE ONE TABLET BY MOUTH DAILY 30 tablet 6  . Red Yeast Rice 600 MG CAPS Take 1,200 mg by mouth daily.       No current facility-administered medications for this visit.   Allergies:  Aleve; Crestor; Hydrocodone-acetaminophen; Lipitor; Oxycodone-acetaminophen; Pravachol; and Zocor   Social History: The patient  reports that she has been smoking Cigarettes.  She has a 15 pack-year smoking history. She has never used smokeless tobacco. She reports that she does not drink alcohol or use illicit drugs.   ROS:  Please see the history of present illness. Otherwise, complete review of systems is positive for claudication, URI within the last month..  All other systems are reviewed and negative.   Physical Exam: VS:  BP 110/72  mmHg  Pulse 57  Ht  (1.575 m)  Wt 155 lb (70.308 kg)  BMI 28.34 kg/m2  SpO2 97%, BMI Body mass index is 28.34 kg/(m^2).  Wt Readings from Last 3 Encounters:  06/01/15 155 lb (70.308 kg)  10/26/14 148 lb (67.132 kg)  10/04/14 150 lb (68.04 kg)    Appears comfortable at rest.  HEENT: Conjunctiva and lids normal, oropharynx clear with poor dentition.  Neck: Supple, no elevated JVP or carotid bruits, no thyromegaly.  Lungs: Diminished but clear to auscultation, nonlabored breathing at rest.  Cardiac: Regular rate and rhythm, no S3 or significant systolic murmur,  no pericardial rub.  Abdomen: Soft, nontender, bowel sounds present.  Extremities: No pitting edema, distal pulses diminished..   ECG: ECG is ordered today.  Recent Labwork: No results found for requested labs within last 365 days.     Component Value Date/Time   CHOL 232* 05/03/2015 1110   TRIG 107 05/03/2015 1110   HDL 44 05/03/2015 1110   CHOLHDL 5.3 05/03/2015 1110   VLDL 21 05/03/2015 1110   LDLCALC 167* 05/03/2015 1110   LDLDIRECT 96.7 10/19/2006 1506    Other Studies Reviewed Today:  Lexiscan Cardiolite in January 2012 reported no active ischemic defects, LVEF 57%.  Assessment and Plan:  1. Symptomatically stable multivessel CAD status post PTCA to the distal circumflex with residual moderate LAD disease and branch vessel PDA disease. Plan is to continue medical therapy. I continue to address with her the importance of smoking cessation.  2. Hyperlipidemia with statin intolerance. Has concerns about the cost of Zetia. Patient declined referral to the lipid clinic and wants to try flaxseed. Her last LDL was 167.  3. Ongoing tobacco abuse. We had discussed smoking cessation several times, she has had difficulty quitting.  4. Peripheral arterial disease as outlined above, followed by Dr. Darrick Penna.  Current medicines were reviewed with the patient today. She remains a relatively high-risk for adverse cardiovascular events.    Orders Placed This Encounter  Procedures  . EKG 12-Lead    Disposition: FU with me in 6 months.   Signed, Jonelle Sidle, MD, [facility] 06/01/2015 2:13 PM    [facility] [address] [place], [place], [place] [address] Phone: [phone]; Fax: [phone]

## 2015-06-30 ENCOUNTER — Other Ambulatory Visit: Payer: Self-pay | Admitting: Cardiology

## 2015-07-09 ENCOUNTER — Other Ambulatory Visit: Payer: Self-pay | Admitting: Cardiology

## 2015-07-12 ENCOUNTER — Other Ambulatory Visit: Payer: Self-pay | Admitting: Cardiology

## 2015-08-04 ENCOUNTER — Other Ambulatory Visit: Payer: Self-pay | Admitting: Cardiology

## 2015-10-19 ENCOUNTER — Encounter: Payer: Self-pay | Admitting: Vascular Surgery

## 2015-10-25 ENCOUNTER — Ambulatory Visit (HOSPITAL_COMMUNITY): Payer: Medicare Other

## 2015-10-25 ENCOUNTER — Ambulatory Visit: Payer: Medicare Other | Admitting: Vascular Surgery

## 2015-11-14 ENCOUNTER — Other Ambulatory Visit: Payer: Self-pay | Admitting: Cardiology

## 2016-01-07 ENCOUNTER — Other Ambulatory Visit: Payer: Self-pay | Admitting: Cardiology

## 2016-01-21 ENCOUNTER — Other Ambulatory Visit: Payer: Self-pay | Admitting: Cardiology

## 2016-01-31 ENCOUNTER — Ambulatory Visit: Payer: Medicare Other | Admitting: Vascular Surgery

## 2016-01-31 ENCOUNTER — Ambulatory Visit (HOSPITAL_COMMUNITY)
Admission: RE | Admit: 2016-01-31 | Discharge: 2016-01-31 | Disposition: A | Discharge status: Home / Self Care | Payer: Medicare Other | Source: Ambulatory Visit | Attending: Vascular Surgery | Admitting: Vascular Surgery

## 2016-01-31 DIAGNOSIS — I2510 Atherosclerotic heart disease of native coronary artery without angina pectoris: Secondary | ICD-10-CM | POA: Insufficient documentation

## 2016-01-31 DIAGNOSIS — Z95828 Presence of other vascular implants and grafts: Secondary | ICD-10-CM | POA: Insufficient documentation

## 2016-01-31 DIAGNOSIS — I739 Peripheral vascular disease, unspecified: Secondary | ICD-10-CM | POA: Diagnosis not present

## 2016-01-31 DIAGNOSIS — E785 Hyperlipidemia, unspecified: Secondary | ICD-10-CM | POA: Diagnosis not present

## 2016-01-31 DIAGNOSIS — F419 Anxiety disorder, unspecified: Secondary | ICD-10-CM | POA: Diagnosis not present

## 2016-01-31 DIAGNOSIS — R0989 Other specified symptoms and signs involving the circulatory and respiratory systems: Secondary | ICD-10-CM | POA: Diagnosis present

## 2016-01-31 DIAGNOSIS — I6523 Occlusion and stenosis of bilateral carotid arteries: Secondary | ICD-10-CM | POA: Insufficient documentation

## 2016-02-04 ENCOUNTER — Encounter: Payer: Self-pay | Admitting: Vascular Surgery

## 2016-02-07 ENCOUNTER — Encounter: Payer: Self-pay | Admitting: Cardiology

## 2016-02-07 ENCOUNTER — Ambulatory Visit: Payer: Medicare Other | Admitting: Vascular Surgery

## 2016-02-11 NOTE — Progress Notes (Signed)
Cardiology Office Note  Date: 02/12/2016   ID: Kelsey Islamracy L Hoelzer, DOB [birth year], MRN [id number]  PCP: No primary care provider on file.  Primary Cardiologist: Nona DellSamuel Dace Denn, MD   Chief Complaint  Patient presents with  . Coronary Artery Disease    History of Present Illness: Kelsey Hansen is a 50 y.o. female last seen in December 2016. She presents for a routine follow-up visit. Describes intermittent angina symptoms that improved with nitroglycerin, and a reporting discomfort in her shoulders and arms.  She is trying to cut back smoking, has not been able to quit. She states that one pack of cigarettes will last for 1 week. She has tried some nicotine replacement in the form of inhalers but states that she did not like the way she felt.  She has a history of known statin intolerance as detailed previously, declined referral to the lipid clinic for other options.  Last stress test was in 2012 as outlined below. We discussed updating this to see how significant an ischemic burden she has to help guide additional treatment options. She is reluctant to undergo another heart catheterization, but would consider it.  She states that she missed her visit with Dr. Darrick PennaFields. Recent arterial studies showed right ABI 0.93 and left ABI 0.73  Carotid Dopplers showed less than 40% RICA stenosis and 40-59% LICA stenosis.  Past Medical History:  Diagnosis Date  . Anxiety   . 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 Coronary atherosclerosis of native coronary artery    Multivessel  . Hyperlipidemia   . NSTEMI (non-ST elevated myocardial infarction) (HCC) 11/07   PTCA of distal circumflex 2007, residual 50% LAD and 80% PDA  . Peripheral arterial disease (HCC)    Right common and external iliac stent, right to left fem-fem bypass    Past Surgical History:  Procedure Laterality Date  . FEMORAL BYPASS  7/08   Right to left; followed by  right external iliac artery stent placed 12/08  . PR VEIN BYPASS GRAFT,AORTO-FEM-POP    . SP US TRANSCATHETER OCCLUSION     Occlusion off her fem-fem bypass with claudication in both legs    Current Outpatient Prescriptions  Medication Sig Dispense Refill  . aspirin 81 MG tablet Take 81 mg by mouth daily.      . carvedilol (COREG) 12.5 MG tablet TAKE 1 TABLET BY MOUTH TWICE A DAY 180 tablet 3  . isosorbide mononitrate (IMDUR) 60 MG 24 hr tablet TAKE 3 TABLETS BY MOUTH EVERY DAY 90 tablet 6  . montelukast (SINGULAIR) 10 MG tablet TAKE 1 TABLET BY MOUTH IN THE EVENING *NOT COVERED*  5  . nitroGLYCERIN (NITROSTAT) 0.4 MG SL tablet PLACE 1 TABLET UNDER TONGUE EVERY 5 MINUTES AS NEEDED. 25 tablet 3  . PLAVIX 75 MG tablet TAKE ONE TABLET BY MOUTH DAILY 30 tablet 6   No current facility-administered medications for this visit.    Allergies:  Aleve [naproxen]; Crestor [rosuvastatin]; Hydrocodone-acetaminophen; Lipitor [atorvastatin]; Oxycodone-acetaminophen; Pravachol [pravastatin]; and Zocor [simvastatin]   Social History: The patient  reports that she has been smoking Cigarettes.  She has a 15.00 pack-year smoking history. She has never used smokeless tobacco. She reports that she does not drink alcohol or use drugs.   ROS:  Please see the history of present illness. Otherwise, complete review of systems is positive for mild claudication.  All other systems are reviewed and negative.   Physical Exam: VS:  BP (!) 148/90  Pulse 62   Ht 5\' 2"  (1.575 m)   Wt 148 lb (67.1 kg)   SpO2 96%   BMI 27.07 kg/m , BMI Body mass index is 27.07 kg/m.  Wt Readings from Last 3 Encounters:  02/12/16 148 lb (67.1 kg)  06/01/15 155 lb (70.3 kg)  10/26/14 148 lb (67.1 kg)    Appears comfortable at rest.  HEENT: Conjunctiva and lids normal, oropharynx clear with poor dentition.  Neck: Supple, no elevated JVP or carotid bruits, no thyromegaly.  Lungs: Diminished but clear to auscultation, nonlabored  breathing at rest.  Cardiac: Regular rate and rhythm, no S3 or significant systolic murmur, no pericardial rub.  Abdomen: Soft, nontender, bowel sounds present.  Extremities: No pitting edema, distal pulses diminished. Skin: Warm and dry. Muscular skeletal: No kyphosis. Neuropsychiatric: Alert and oriented 3, affect appropriate.  ECG: I personally reviewed the tracing from 06/01/2015 which showed sinus rhythm with left atrial enlargement.  Recent Labwork:    Component Value Date/Time   CHOL 232 (H) 05/03/2015 1110   TRIG 107 05/03/2015 1110   HDL 44 05/03/2015 1110   CHOLHDL 5.3 05/03/2015 1110   VLDL 21 05/03/2015 1110   LDLCALC 167 (H) 05/03/2015 1110   LDLDIRECT 96.7 10/19/2006 1506    Other Studies Reviewed Today:  Lexiscan Cardiolite January 2012: No active ischemic defects, LVEF 57%.  Assessment and Plan:  1. Multivessel CAD as outlined above with prior angioplasty of the distal circumflex and moderate residual disease. Cardiolite in 2012 was low risk. She is reporting angina symptoms with regularity and a Myoview study will be arranged for reassessment of ischemic burden to help guide further treatment options.  2. Long-standing tobacco abuse. We continue to discuss smoking cessation. She has been able to cut back but not quit.  3. Hyperlipidemia with statin intolerance. We have discussed this over time, she has declined other treatment options so far.  4. Peripheral arterial disease, followed by Dr. Darrick Penna. Recent arterial studies noted above. She has stable claudication.  Current medicines were reviewed with the patient today.   Orders Placed This Encounter  Procedures  . NM Myocar Multi W/Spect W/Wall Motion / EF  . Myocardial Perfusion Imaging    Disposition: Follow-up with me in 6 months.  Signed, Jonelle Sidle, MD, [facility] 02/12/2016 12:10 PM    [facility] [address] [place], [place], [place] [address] Phone: [phone]; Fax: [phone]

## 2016-02-12 ENCOUNTER — Encounter: Payer: Self-pay | Admitting: *Deleted

## 2016-02-12 ENCOUNTER — Encounter: Payer: Self-pay | Admitting: Cardiology

## 2016-02-12 ENCOUNTER — Ambulatory Visit (INDEPENDENT_AMBULATORY_CARE_PROVIDER_SITE_OTHER): Payer: Medicare Other | Admitting: Cardiology

## 2016-02-12 VITALS — BP 148/90 | HR 62 | Ht 62.0 in | Wt 148.0 lb

## 2016-02-12 DIAGNOSIS — Z889 Allergy status to unspecified drugs, medicaments and biological substances status: Secondary | ICD-10-CM | POA: Diagnosis not present

## 2016-02-12 DIAGNOSIS — E782 Mixed hyperlipidemia: Secondary | ICD-10-CM

## 2016-02-12 DIAGNOSIS — Z720 Tobacco use: Secondary | ICD-10-CM | POA: Diagnosis not present

## 2016-02-12 DIAGNOSIS — I25119 Atherosclerotic heart disease of native coronary artery with unspecified angina pectoris: Secondary | ICD-10-CM

## 2016-02-12 DIAGNOSIS — Z789 Other specified health status: Secondary | ICD-10-CM

## 2016-02-12 DIAGNOSIS — I25118 Atherosclerotic heart disease of native coronary artery with other forms of angina pectoris: Secondary | ICD-10-CM

## 2016-02-12 DIAGNOSIS — I739 Peripheral vascular disease, unspecified: Secondary | ICD-10-CM

## 2016-02-12 DIAGNOSIS — I6523 Occlusion and stenosis of bilateral carotid arteries: Secondary | ICD-10-CM

## 2016-02-12 NOTE — Patient Instructions (Addendum)
Medication Instructions:  Continue all current medications.  Labwork: none  Testing/Procedures:  Your physician has requested that you have a lexiscan myoview. For further information please visit [email or web address]. Please follow instruction sheet, as given.  Office will contact with results via phone or letter.    Follow-Up: Your physician wants you to follow up in: 6 months.  You will receive a reminder letter in the mail one-two months in advance.  If you don't receive a letter, please call our office to schedule the follow up appointment   Any Other Special Instructions Will Be Listed Below (If Applicable).  If you need a refill on your cardiac medications before your next appointment, please call your pharmacy.  

## 2016-02-15 ENCOUNTER — Emergency Department (HOSPITAL_COMMUNITY): Payer: Medicare Other

## 2016-02-15 ENCOUNTER — Other Ambulatory Visit: Payer: Self-pay

## 2016-02-15 ENCOUNTER — Encounter (HOSPITAL_COMMUNITY)
Admission: RE | Admit: 2016-02-15 | Discharge: 2016-02-15 | Disposition: A | Discharge status: Home / Self Care | Payer: Medicaid - Out of State | Source: Ambulatory Visit | Attending: Cardiology | Admitting: Cardiology

## 2016-02-15 ENCOUNTER — Encounter (HOSPITAL_COMMUNITY)
Admission: EM | Disposition: A | Discharge status: Home Health | Payer: Self-pay | Source: Home / Self Care | Attending: Cardiothoracic Surgery

## 2016-02-15 ENCOUNTER — Encounter (HOSPITAL_COMMUNITY): Payer: Self-pay | Admitting: Emergency Medicine

## 2016-02-15 ENCOUNTER — Other Ambulatory Visit (HOSPITAL_COMMUNITY): Payer: Self-pay

## 2016-02-15 ENCOUNTER — Ambulatory Visit (HOSPITAL_COMMUNITY): Admission: RE | Admit: 2016-02-15 | Payer: Medicare Other | Source: Ambulatory Visit

## 2016-02-15 ENCOUNTER — Other Ambulatory Visit: Payer: Self-pay | Admitting: *Deleted

## 2016-02-15 ENCOUNTER — Inpatient Hospital Stay (HOSPITAL_COMMUNITY)
Admission: EM | Admit: 2016-02-15 | Discharge: 2016-03-01 | DRG: 234 | Disposition: A | Discharge status: Home Health | Payer: Medicare Other | Attending: Cardiothoracic Surgery | Admitting: Cardiothoracic Surgery

## 2016-02-15 ENCOUNTER — Encounter (HOSPITAL_COMMUNITY): Payer: Self-pay

## 2016-02-15 ENCOUNTER — Ambulatory Visit (HOSPITAL_COMMUNITY): Admit: 2016-02-15 | Payer: Medicare Other | Admitting: Cardiovascular Disease

## 2016-02-15 DIAGNOSIS — I200 Unstable angina: Secondary | ICD-10-CM | POA: Diagnosis not present

## 2016-02-15 DIAGNOSIS — I471 Supraventricular tachycardia: Secondary | ICD-10-CM | POA: Diagnosis not present

## 2016-02-15 DIAGNOSIS — Z7982 Long term (current) use of aspirin: Secondary | ICD-10-CM

## 2016-02-15 DIAGNOSIS — R112 Nausea with vomiting, unspecified: Secondary | ICD-10-CM | POA: Diagnosis not present

## 2016-02-15 DIAGNOSIS — Z79899 Other long term (current) drug therapy: Secondary | ICD-10-CM

## 2016-02-15 DIAGNOSIS — R9431 Abnormal electrocardiogram [ECG] [EKG]: Secondary | ICD-10-CM | POA: Diagnosis not present

## 2016-02-15 DIAGNOSIS — Z95820 Peripheral vascular angioplasty status with implants and grafts: Secondary | ICD-10-CM

## 2016-02-15 DIAGNOSIS — I10 Essential (primary) hypertension: Secondary | ICD-10-CM | POA: Diagnosis present

## 2016-02-15 DIAGNOSIS — Z7902 Long term (current) use of antithrombotics/antiplatelets: Secondary | ICD-10-CM

## 2016-02-15 DIAGNOSIS — Z9861 Coronary angioplasty status: Secondary | ICD-10-CM

## 2016-02-15 DIAGNOSIS — E8770 Fluid overload, unspecified: Secondary | ICD-10-CM | POA: Diagnosis not present

## 2016-02-15 DIAGNOSIS — J449 Chronic obstructive pulmonary disease, unspecified: Secondary | ICD-10-CM | POA: Diagnosis present

## 2016-02-15 DIAGNOSIS — Z01810 Encounter for preprocedural cardiovascular examination: Secondary | ICD-10-CM | POA: Diagnosis not present

## 2016-02-15 DIAGNOSIS — R079 Chest pain, unspecified: Secondary | ICD-10-CM | POA: Diagnosis not present

## 2016-02-15 DIAGNOSIS — F17210 Nicotine dependence, cigarettes, uncomplicated: Secondary | ICD-10-CM | POA: Diagnosis present

## 2016-02-15 DIAGNOSIS — I25110 Atherosclerotic heart disease of native coronary artery with unstable angina pectoris: Principal | ICD-10-CM | POA: Diagnosis present

## 2016-02-15 DIAGNOSIS — I25118 Atherosclerotic heart disease of native coronary artery with other forms of angina pectoris: Secondary | ICD-10-CM

## 2016-02-15 DIAGNOSIS — R0602 Shortness of breath: Secondary | ICD-10-CM

## 2016-02-15 DIAGNOSIS — I739 Peripheral vascular disease, unspecified: Secondary | ICD-10-CM | POA: Diagnosis present

## 2016-02-15 DIAGNOSIS — F419 Anxiety disorder, unspecified: Secondary | ICD-10-CM | POA: Diagnosis not present

## 2016-02-15 DIAGNOSIS — Z951 Presence of aortocoronary bypass graft: Secondary | ICD-10-CM

## 2016-02-15 DIAGNOSIS — I2510 Atherosclerotic heart disease of native coronary artery without angina pectoris: Secondary | ICD-10-CM | POA: Diagnosis present

## 2016-02-15 DIAGNOSIS — F17200 Nicotine dependence, unspecified, uncomplicated: Secondary | ICD-10-CM | POA: Diagnosis not present

## 2016-02-15 DIAGNOSIS — D62 Acute posthemorrhagic anemia: Secondary | ICD-10-CM | POA: Diagnosis not present

## 2016-02-15 DIAGNOSIS — E782 Mixed hyperlipidemia: Secondary | ICD-10-CM | POA: Diagnosis present

## 2016-02-15 DIAGNOSIS — I252 Old myocardial infarction: Secondary | ICD-10-CM | POA: Diagnosis not present

## 2016-02-15 HISTORY — PX: CARDIAC CATHETERIZATION: SHX172

## 2016-02-15 LAB — BASIC METABOLIC PANEL
ANION GAP: 9 (ref 5–15)
BUN: 7 mg/dL (ref 6–20)
CHLORIDE: 102 mmol/L (ref 101–111)
CO2: 26 mmol/L (ref 22–32)
Calcium: 9.2 mg/dL (ref 8.9–10.3)
Creatinine, Ser: 0.56 mg/dL (ref 0.44–1.00)
Glucose, Bld: 135 mg/dL — ABNORMAL HIGH (ref 65–99)
POTASSIUM: 3.5 mmol/L (ref 3.5–5.1)
SODIUM: 137 mmol/L (ref 135–145)

## 2016-02-15 LAB — CBC WITH DIFFERENTIAL/PLATELET
Basophils Absolute: 0 10*3/uL (ref 0.0–0.1)
Basophils Relative: 0 %
EOS ABS: 0.2 10*3/uL (ref 0.0–0.7)
EOS PCT: 1 %
HCT: 37.9 % (ref 36.0–46.0)
Hemoglobin: 13.2 g/dL (ref 12.0–15.0)
LYMPHS ABS: 3.9 10*3/uL (ref 0.7–4.0)
LYMPHS PCT: 31 %
MCH: 32.2 pg (ref 26.0–34.0)
MCHC: 34.8 g/dL (ref 30.0–36.0)
MCV: 92.4 fL (ref 78.0–100.0)
MONO ABS: 0.6 10*3/uL (ref 0.1–1.0)
Monocytes Relative: 5 %
Neutro Abs: 7.8 10*3/uL — ABNORMAL HIGH (ref 1.7–7.7)
Neutrophils Relative %: 63 %
PLATELETS: 319 10*3/uL (ref 150–400)
RBC: 4.1 MIL/uL (ref 3.87–5.11)
RDW: 12.2 % (ref 11.5–15.5)
WBC: 12.6 10*3/uL — ABNORMAL HIGH (ref 4.0–10.5)

## 2016-02-15 LAB — NM MYOCAR MULTI W/SPECT W/WALL MOTION / EF
CSEPPHR: 105 {beats}/min
MPHR: 172 {beats}/min
Percent HR: 61 %
Rest HR: 60 {beats}/min

## 2016-02-15 LAB — TROPONIN I: Troponin I: <0.03 ng/mL (ref <0.03)

## 2016-02-15 SURGERY — LEFT HEART CATH AND CORONARY ANGIOGRAPHY
Anesthesia: LOCAL

## 2016-02-15 MED ORDER — MIDAZOLAM HCL 2 MG/2ML IJ SOLN
INTRAMUSCULAR | Status: DC | PRN
  Administered 2016-02-15 (×2): 1 mg via INTRAVENOUS

## 2016-02-15 MED ORDER — NITROGLYCERIN 0.4 MG SL SUBL
SUBLINGUAL_TABLET | SUBLINGUAL | Status: AC
  Administered 2016-02-15: 0.4 mg via SUBLINGUAL
  Filled 2016-02-15: qty 1

## 2016-02-15 MED ORDER — ASPIRIN EC 81 MG PO TBEC
81.0000 mg | DELAYED_RELEASE_TABLET | Freq: Every day | ORAL | Status: DC
  Administered 2016-02-16 – 2016-02-19 (×4): 81 mg via ORAL
  Filled 2016-02-15 (×4): qty 1

## 2016-02-15 MED ORDER — HEPARIN (PORCINE) IN NACL 2-0.9 UNIT/ML-% IJ SOLN
INTRAMUSCULAR | Status: AC
  Filled 2016-02-15: qty 1000

## 2016-02-15 MED ORDER — MIDAZOLAM HCL 2 MG/2ML IJ SOLN
INTRAMUSCULAR | Status: AC
  Filled 2016-02-15: qty 2

## 2016-02-15 MED ORDER — NITROGLYCERIN 0.4 MG SL SUBL: 0.4000 mg | SUBLINGUAL_TABLET | SUBLINGUAL | Status: DC | PRN

## 2016-02-15 MED ORDER — IOPAMIDOL (ISOVUE-370) INJECTION 76%
INTRAVENOUS | Status: AC
  Filled 2016-02-15: qty 100

## 2016-02-15 MED ORDER — FENTANYL CITRATE (PF) 100 MCG/2ML IJ SOLN
INTRAMUSCULAR | Status: AC
  Filled 2016-02-15: qty 2

## 2016-02-15 MED ORDER — LIDOCAINE HCL (PF) 1 % IJ SOLN
INTRAMUSCULAR | Status: DC | PRN
  Administered 2016-02-15: 2 mL via INTRADERMAL

## 2016-02-15 MED ORDER — VERAPAMIL HCL 2.5 MG/ML IV SOLN
INTRAVENOUS | Status: DC | PRN
  Administered 2016-02-15: 10 mL via INTRA_ARTERIAL

## 2016-02-15 MED ORDER — ONDANSETRON HCL 4 MG/2ML IJ SOLN
4.0000 mg | Freq: Four times a day (QID) | INTRAMUSCULAR | Status: DC | PRN
  Filled 2016-02-15: qty 2

## 2016-02-15 MED ORDER — HEPARIN SODIUM (PORCINE) 1000 UNIT/ML IJ SOLN
INTRAMUSCULAR | Status: DC | PRN
Start: 2016-02-15 — End: 2016-02-15
  Administered 2016-02-15: 3500 [IU] via INTRAVENOUS

## 2016-02-15 MED ORDER — SODIUM CHLORIDE 0.9% FLUSH
3.0000 mL | Freq: Two times a day (BID) | INTRAVENOUS | Status: DC
  Administered 2016-02-15: 3 mL via INTRAVENOUS

## 2016-02-15 MED ORDER — ALPRAZOLAM 0.25 MG PO TABS
0.2500 mg | ORAL_TABLET | Freq: Three times a day (TID) | ORAL | Status: DC | PRN
  Administered 2016-02-15 – 2016-02-16 (×2): 0.25 mg via ORAL
  Filled 2016-02-15 (×3): qty 1

## 2016-02-15 MED ORDER — HEPARIN (PORCINE) IN NACL 100-0.45 UNIT/ML-% IJ SOLN
800.0000 [IU]/h | INTRAMUSCULAR | Status: DC
Start: 2016-02-16 — End: 2016-02-16
  Administered 2016-02-16: 02:00:00 800 [IU]/h via INTRAVENOUS
  Filled 2016-02-15: qty 250

## 2016-02-15 MED ORDER — SODIUM CHLORIDE 0.9 % IV SOLN: 250.0000 mL | INTRAVENOUS | Status: DC | PRN

## 2016-02-15 MED ORDER — FENTANYL CITRATE (PF) 100 MCG/2ML IJ SOLN
INTRAMUSCULAR | Status: DC | PRN
  Administered 2016-02-15: 50 ug via INTRAVENOUS

## 2016-02-15 MED ORDER — ASPIRIN 81 MG PO CHEW: 81.0000 mg | CHEWABLE_TABLET | ORAL | Status: DC

## 2016-02-15 MED ORDER — TECHNETIUM TC 99M TETROFOSMIN IV KIT
10.0000 | PACK | Freq: Once | INTRAVENOUS | Status: AC | PRN
  Administered 2016-02-15: 11 via INTRAVENOUS

## 2016-02-15 MED ORDER — IOPAMIDOL (ISOVUE-370) INJECTION 76%
INTRAVENOUS | Status: DC | PRN
  Administered 2016-02-15: 80 mL via INTRA_ARTERIAL

## 2016-02-15 MED ORDER — LIDOCAINE HCL (PF) 1 % IJ SOLN
INTRAMUSCULAR | Status: AC
  Filled 2016-02-15: qty 30

## 2016-02-15 MED ORDER — SODIUM CHLORIDE 0.9% FLUSH
3.0000 mL | Freq: Two times a day (BID) | INTRAVENOUS | Status: DC
  Administered 2016-02-16 – 2016-02-19 (×5): 3 mL via INTRAVENOUS

## 2016-02-15 MED ORDER — CARVEDILOL 12.5 MG PO TABS
12.5000 mg | ORAL_TABLET | Freq: Two times a day (BID) | ORAL | Status: DC
  Administered 2016-02-15 – 2016-02-16 (×2): 12.5 mg via ORAL
  Filled 2016-02-15 (×3): qty 1

## 2016-02-15 MED ORDER — MOMETASONE FURO-FORMOTEROL FUM 100-5 MCG/ACT IN AERO
2.0000 | INHALATION_SPRAY | Freq: Two times a day (BID) | RESPIRATORY_TRACT | Status: DC
Start: 2016-02-15 — End: 2016-02-17
  Administered 2016-02-15 – 2016-02-16 (×3): 2 via RESPIRATORY_TRACT
  Filled 2016-02-15: qty 8.8

## 2016-02-15 MED ORDER — MONTELUKAST SODIUM 10 MG PO TABS
10.0000 mg | ORAL_TABLET | Freq: Every day | ORAL | Status: DC
  Filled 2016-02-15: qty 1

## 2016-02-15 MED ORDER — HEPARIN (PORCINE) IN NACL 2-0.9 UNIT/ML-% IJ SOLN
INTRAMUSCULAR | Status: DC | PRN
  Administered 2016-02-15: 1000 mL

## 2016-02-15 MED ORDER — IPRATROPIUM-ALBUTEROL 0.5-2.5 (3) MG/3ML IN SOLN
3.0000 mL | Freq: Four times a day (QID) | RESPIRATORY_TRACT | Status: DC
  Administered 2016-02-15 – 2016-02-16 (×4): 3 mL via RESPIRATORY_TRACT
  Filled 2016-02-15 (×4): qty 3

## 2016-02-15 MED ORDER — ALPRAZOLAM 0.25 MG PO TABS: 0.2500 mg | ORAL_TABLET | Freq: Three times a day (TID) | ORAL | Status: DC | PRN

## 2016-02-15 MED ORDER — HEPARIN SODIUM (PORCINE) 1000 UNIT/ML IJ SOLN
INTRAMUSCULAR | Status: AC
  Filled 2016-02-15: qty 1

## 2016-02-15 MED ORDER — CLOPIDOGREL BISULFATE 75 MG PO TABS
75.0000 mg | ORAL_TABLET | Freq: Every day | ORAL | Status: DC
  Administered 2016-02-15: 13:00:00 75 mg via ORAL
  Filled 2016-02-15: qty 1

## 2016-02-15 MED ORDER — SODIUM CHLORIDE 0.9% FLUSH: 3.0000 mL | INTRAVENOUS | Status: DC | PRN

## 2016-02-15 MED ORDER — MORPHINE SULFATE (PF) 2 MG/ML IV SOLN
1.0000 mg | INTRAVENOUS | Status: DC | PRN
  Administered 2016-02-15 – 2016-02-19 (×7): 2 mg via INTRAVENOUS
  Filled 2016-02-15 (×7): qty 1

## 2016-02-15 MED ORDER — SODIUM CHLORIDE 0.9 % WEIGHT BASED INFUSION
3.0000 mL/kg/h | INTRAVENOUS | Status: DC
  Administered 2016-02-15: 13:00:00 3 mL/kg/h via INTRAVENOUS

## 2016-02-15 MED ORDER — SODIUM CHLORIDE 0.9 % WEIGHT BASED INFUSION: 1.0000 mL/kg/h | INTRAVENOUS | Status: DC

## 2016-02-15 MED ORDER — SODIUM CHLORIDE 0.9 % IV SOLN
250.0000 mL | INTRAVENOUS | Status: DC | PRN
  Administered 2016-02-20: 08:00:00 via INTRAVENOUS

## 2016-02-15 MED ORDER — HEPARIN (PORCINE) IN NACL 100-0.45 UNIT/ML-% IJ SOLN
800.0000 [IU]/h | INTRAMUSCULAR | Status: DC
  Administered 2016-02-15: 13:00:00 800 [IU]/h via INTRAVENOUS
  Filled 2016-02-15: qty 250

## 2016-02-15 MED ORDER — MORPHINE SULFATE (PF) 2 MG/ML IV SOLN
INTRAVENOUS | Status: AC
  Filled 2016-02-15: qty 1

## 2016-02-15 MED ORDER — SODIUM CHLORIDE 0.9 % IV SOLN
INTRAVENOUS | Status: DC
  Administered 2016-02-15: 50 mL/h via INTRAVENOUS

## 2016-02-15 MED ORDER — REGADENOSON 0.4 MG/5ML IV SOLN
INTRAVENOUS | Status: AC
  Administered 2016-02-15: 0.4 mg
  Filled 2016-02-15: qty 5

## 2016-02-15 MED ORDER — VERAPAMIL HCL 2.5 MG/ML IV SOLN
INTRAVENOUS | Status: AC
  Filled 2016-02-15: qty 2

## 2016-02-15 MED ORDER — HEPARIN (PORCINE) IN NACL 100-0.45 UNIT/ML-% IJ SOLN
14.0000 [IU]/kg/h | Freq: Once | INTRAMUSCULAR | Status: AC
  Administered 2016-02-15: 14 [IU]/kg/h via INTRAVENOUS
  Filled 2016-02-15: qty 250

## 2016-02-15 MED ORDER — ASPIRIN 81 MG PO CHEW
324.0000 mg | CHEWABLE_TABLET | Freq: Once | ORAL | Status: AC
  Administered 2016-02-15: 324 mg via ORAL
  Filled 2016-02-15: qty 4

## 2016-02-15 MED ORDER — TECHNETIUM TC 99M TETROFOSMIN IV KIT
30.0000 | PACK | Freq: Once | INTRAVENOUS | Status: AC | PRN
  Administered 2016-02-15: 31 via INTRAVENOUS

## 2016-02-15 MED ORDER — ASPIRIN 81 MG PO CHEW: 81.0000 mg | CHEWABLE_TABLET | Freq: Every day | ORAL | Status: DC

## 2016-02-15 MED ORDER — SODIUM CHLORIDE 0.9% FLUSH
3.0000 mL | INTRAVENOUS | Status: DC | PRN
  Administered 2016-02-16: 3 mL via INTRAVENOUS
  Filled 2016-02-15: qty 3

## 2016-02-15 MED ORDER — HEPARIN SODIUM (PORCINE) 5000 UNIT/ML IJ SOLN
4000.0000 [IU] | Freq: Once | INTRAMUSCULAR | Status: AC
  Administered 2016-02-15: 4000 [IU] via INTRAVENOUS
  Filled 2016-02-15: qty 1

## 2016-02-15 MED ORDER — SODIUM CHLORIDE 0.9 % WEIGHT BASED INFUSION: 3.0000 mL/kg/h | INTRAVENOUS | Status: AC

## 2016-02-15 MED ORDER — NITROGLYCERIN 0.4 MG SL SUBL
0.4000 mg | SUBLINGUAL_TABLET | SUBLINGUAL | Status: DC | PRN
  Administered 2016-02-15: 0.4 mg via SUBLINGUAL

## 2016-02-15 MED ORDER — ACETAMINOPHEN 325 MG PO TABS
650.0000 mg | ORAL_TABLET | ORAL | Status: DC | PRN
  Administered 2016-02-15: 650 mg via ORAL
  Filled 2016-02-15: qty 2

## 2016-02-15 SURGICAL SUPPLY — 9 items
CATH INFINITI 5 FR JL3.5 (CATHETERS) ×1 IMPLANT
CATH INFINITI JR4 5F (CATHETERS) ×1 IMPLANT
DEVICE RAD COMP TR BAND LRG (VASCULAR PRODUCTS) ×1 IMPLANT
GLIDESHEATH SLEND A-KIT 6F 22G (SHEATH) ×1 IMPLANT
KIT HEART LEFT (KITS) ×2 IMPLANT
PACK CARDIAC CATHETERIZATION (CUSTOM PROCEDURE TRAY) ×2 IMPLANT
TRANSDUCER W/STOPCOCK (MISCELLANEOUS) ×2 IMPLANT
TUBING CIL FLEX 10 FLL-RA (TUBING) ×2 IMPLANT
WIRE SAFE-T 1.5MM-J .035X260CM (WIRE) ×1 IMPLANT

## 2016-02-15 NOTE — H&P (View-Only) (Signed)
Cardiology Office Note  Date: 02/12/2016   ID: Kelsey Hansen, DOB [birth year], MRN [id number]  PCP: No primary care provider on file.  Primary Cardiologist: Nona DellSamuel Chaeli Judy, MD   Chief Complaint  Patient presents with  . Coronary Artery Disease    History of Present Illness: Kelsey Hansen is a 50 y.o. female last seen in December 2016. She presents for a routine follow-up visit. Describes intermittent angina symptoms that improved with nitroglycerin, and a reporting discomfort in her shoulders and arms.  She is trying to cut back smoking, has not been able to quit. She states that one pack of cigarettes will last for 1 week. She has tried some nicotine replacement in the form of inhalers but states that she did not like the way she felt.  She has a history of known statin intolerance as detailed previously, declined referral to the lipid clinic for other options.  Last stress test was in 2012 as outlined below. We discussed updating this to see how significant an ischemic burden she has to help guide additional treatment options. She is reluctant to undergo another heart catheterization, but would consider it.  She states that she missed her visit with Dr. Darrick PennaFields. Recent arterial studies showed right ABI 0.93 and left ABI 0.73  Carotid Dopplers showed less than 40% RICA stenosis and 40-59% LICA stenosis.  Past Medical History:  Diagnosis Date  . Anxiety   . 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 Coronary atherosclerosis of native coronary artery    Multivessel  . Hyperlipidemia   . NSTEMI (non-ST elevated myocardial infarction) (HCC) 11/07   PTCA of distal circumflex 2007, residual 50% LAD and 80% PDA  . Peripheral arterial disease (HCC)    Right common and external iliac stent, right to left fem-fem bypass    Past Surgical History:  Procedure Laterality Date  . FEMORAL BYPASS  7/08   Right to left; followed by  right external iliac artery stent placed 12/08  . PR VEIN BYPASS GRAFT,AORTO-FEM-POP    . SP US TRANSCATHETER OCCLUSION     Occlusion off her fem-fem bypass with claudication in both legs    Current Outpatient Prescriptions  Medication Sig Dispense Refill  . aspirin 81 MG tablet Take 81 mg by mouth daily.      . carvedilol (COREG) 12.5 MG tablet TAKE 1 TABLET BY MOUTH TWICE A DAY 180 tablet 3  . isosorbide mononitrate (IMDUR) 60 MG 24 hr tablet TAKE 3 TABLETS BY MOUTH EVERY DAY 90 tablet 6  . montelukast (SINGULAIR) 10 MG tablet TAKE 1 TABLET BY MOUTH IN THE EVENING *NOT COVERED*  5  . nitroGLYCERIN (NITROSTAT) 0.4 MG SL tablet PLACE 1 TABLET UNDER TONGUE EVERY 5 MINUTES AS NEEDED. 25 tablet 3  . PLAVIX 75 MG tablet TAKE ONE TABLET BY MOUTH DAILY 30 tablet 6   No current facility-administered medications for this visit.    Allergies:  Aleve [naproxen]; Crestor [rosuvastatin]; Hydrocodone-acetaminophen; Lipitor [atorvastatin]; Oxycodone-acetaminophen; Pravachol [pravastatin]; and Zocor [simvastatin]   Social History: The patient  reports that she has been smoking Cigarettes.  She has a 15.00 pack-year smoking history. She has never used smokeless tobacco. She reports that she does not drink alcohol or use drugs.   ROS:  Please see the history of present illness. Otherwise, complete review of systems is positive for mild claudication.  All other systems are reviewed and negative.   Physical Exam: VS:  BP (!) 148/90  Pulse 62   Ht 5\' 2"  (1.575 m)   Wt 148 lb (67.1 kg)   SpO2 96%   BMI 27.07 kg/m , BMI Body mass index is 27.07 kg/m.  Wt Readings from Last 3 Encounters:  02/12/16 148 lb (67.1 kg)  06/01/15 155 lb (70.3 kg)  10/26/14 148 lb (67.1 kg)    Appears comfortable at rest.  HEENT: Conjunctiva and lids normal, oropharynx clear with poor dentition.  Neck: Supple, no elevated JVP or carotid bruits, no thyromegaly.  Lungs: Diminished but clear to auscultation, nonlabored  breathing at rest.  Cardiac: Regular rate and rhythm, no S3 or significant systolic murmur, no pericardial rub.  Abdomen: Soft, nontender, bowel sounds present.  Extremities: No pitting edema, distal pulses diminished. Skin: Warm and dry. Muscular skeletal: No kyphosis. Neuropsychiatric: Alert and oriented 3, affect appropriate.  ECG: I personally reviewed the tracing from 06/01/2015 which showed sinus rhythm with left atrial enlargement.  Recent Labwork:    Component Value Date/Time   CHOL 232 (H) 05/03/2015 1110   TRIG 107 05/03/2015 1110   HDL 44 05/03/2015 1110   CHOLHDL 5.3 05/03/2015 1110   VLDL 21 05/03/2015 1110   LDLCALC 167 (H) 05/03/2015 1110   LDLDIRECT 96.7 10/19/2006 1506    Other Studies Reviewed Today:  Lexiscan Cardiolite January 2012: No active ischemic defects, LVEF 57%.  Assessment and Plan:  1. Multivessel CAD as outlined above with prior angioplasty of the distal circumflex and moderate residual disease. Cardiolite in 2012 was low risk. She is reporting angina symptoms with regularity and a Myoview study will be arranged for reassessment of ischemic burden to help guide further treatment options.  2. Long-standing tobacco abuse. We continue to discuss smoking cessation. She has been able to cut back but not quit.  3. Hyperlipidemia with statin intolerance. We have discussed this over time, she has declined other treatment options so far.  4. Peripheral arterial disease, followed by Dr. Darrick Penna. Recent arterial studies noted above. She has stable claudication.  Current medicines were reviewed with the patient today.   Orders Placed This Encounter  Procedures  . NM Myocar Multi W/Spect W/Wall Motion / EF  . Myocardial Perfusion Imaging    Disposition: Follow-up with me in 6 months.  Signed, Jonelle Sidle, MD, [facility] 02/12/2016 12:10 PM    [facility] [address] [place], [place], [place] [address] Phone: [phone]; Fax: [phone]

## 2016-02-15 NOTE — ED Provider Notes (Addendum)
[facility]-EMERGENCY DEPT Provider Note   CSN: [id number] Arrival date & time: 02/15/16  4403     History   Chief Complaint Chief Complaint  Patient presents with  . Chest Pain    HPI Kelsey Hansen is a 50 y.o. female.  Patient brought over from cardiac care area following cardiac stress test. Patient failed the stress test. Had significant ischemic changes during the stress test also develop substernal chest pain that radiated to both arms. Patient with a known history of coronary artery disease. Patient's had stents in the past. Patient currently only with minimal substernal chest pain severe pain lasted 5 minutes markedly improved. Patient was given nitroglycerin there with improvement in the pain. Cardiology nurse practitioner's range transferred to the cardiology service a cone initial plan was for patient to go to the Cath Lab. Current plan is for patient to go to CCU and then Cath Lab later today. Patient brought here decrease given aspirin and started on heparin while awaiting transfer by [facility] to [place].      Past Medical History:  Diagnosis Date  . Anxiety   . Brachial artery occlusion [facility])    Postcatheterization complication, required surgical repair  . Coronary atherosclerosis of native coronary artery    Multivessel  . Hyperlipidemia   . NSTEMI (non-ST elevated myocardial infarction) (HCC) 11/07   PTCA of distal circumflex 2007, residual 50% LAD and 80% PDA  . Peripheral arterial disease (HCC)    Right common and external iliac stent, right to left fem-fem bypass    Patient Active Problem List   Diagnosis Date Noted  . Pain in limb-Right groin, Bilat Leg,Right shoulder 08/19/2013  . PVD (peripheral vascular disease) with claudication (HCC) 08/19/2013  . Mixed hyperlipidemia 04/21/2012  . Essential hypertension, benign 05/16/2011  . TOBACCO ABUSE 05/01/2008  . Coronary atherosclerosis of native coronary artery 05/01/2008  . PERIPHERAL VASCULAR DISEASE  WITH CLAUDICATION 05/01/2008    Past Surgical History:  Procedure Laterality Date  . FEMORAL BYPASS  7/08   Right to left; followed by right external iliac artery stent placed 12/08  . PR VEIN BYPASS GRAFT,AORTO-FEM-POP    . SP [place] TRANSCATHETER OCCLUSION     Occlusion off her fem-fem bypass with claudication in both legs    OB History    No data available       Home Medications    Prior to Admission medications   Medication Sig Start Date End Date Taking? Authorizing Provider  aspirin 81 MG tablet Take 81 mg by mouth daily.      Historical Provider, MD  carvedilol (COREG) 12.5 MG tablet TAKE 1 TABLET BY MOUTH TWICE A DAY 07/02/15   Jonelle Sidle, MD  isosorbide mononitrate (IMDUR) 60 MG 24 hr tablet TAKE 3 TABLETS BY MOUTH EVERY DAY 07/12/15   Jonelle Sidle, MD  montelukast (SINGULAIR) 10 MG tablet TAKE 1 TABLET BY MOUTH IN THE EVENING *NOT COVERED* 05/15/15   Historical Provider, MD  nitroGLYCERIN (NITROSTAT) 0.4 MG SL tablet PLACE 1 TABLET UNDER TONGUE EVERY 5 MINUTES AS NEEDED. 11/14/15   Jonelle Sidle, MD  PLAVIX 75 MG tablet TAKE ONE TABLET BY MOUTH DAILY 05/17/14   Jonelle Sidle, MD    Family History Family History  Problem Relation Age of Onset  . Cancer Mother     Lung  . Heart disease Mother     Heart Disease before age 48  . Heart attack Mother 11  . Deep vein thrombosis Father   .  Arthritis Father     Back  . Varicose Veins Father   . Cancer Sister     Breast  . Diabetes Neg Hx   . Hypertension Neg Hx   . Coronary artery disease Neg Hx     Social History Social History  Substance Use Topics  . Smoking status: Current Every Day Smoker    Packs/day: 0.50    Years: 30.00    Types: Cigarettes  . Smokeless tobacco: Never Used     Comment: 10 ciggs per day, started back  . Alcohol use No     Allergies   Aleve [naproxen]; Crestor [rosuvastatin]; Hydrocodone-acetaminophen; Lipitor [atorvastatin]; Oxycodone-acetaminophen; Pravachol  [pravastatin]; and Zocor [simvastatin]   Review of Systems Review of Systems  Constitutional: Negative for fever.  HENT: Negative for congestion.   Eyes: Negative for visual disturbance.  Respiratory: Negative for shortness of breath.   Cardiovascular: Positive for chest pain.  Gastrointestinal: Negative for abdominal pain.  Musculoskeletal: Negative for back pain.  Skin: Negative for rash.  Neurological: Negative for headaches.  Hematological: Does not bruise/bleed easily.  Psychiatric/Behavioral: Negative for confusion.     Physical Exam Updated Vital Signs BP 164/81 (BP Location: Left Arm)   Pulse 79   Temp 98.1 F (36.7 C) (Oral)   Resp 17   Ht 5\' 2"  (1.575 m)   Wt 67.4 kg   SpO2 97%   BMI 27.18 kg/m   Physical Exam  Constitutional: She is oriented to person, place, and time. She appears well-developed and well-nourished. No distress.  HENT:  Head: Normocephalic and atraumatic.  Eyes: EOM are normal. Pupils are equal, round, and reactive to light.  Neck: Normal range of motion.  Cardiovascular: Normal rate, regular rhythm and normal heart sounds.   Pulmonary/Chest: Effort normal and breath sounds normal. No respiratory distress.  Abdominal: Soft. Bowel sounds are normal. There is no tenderness.  Musculoskeletal: Normal range of motion. She exhibits no edema.  Neurological: She is alert and oriented to person, place, and time. No cranial nerve deficit. She exhibits normal muscle tone. Coordination normal.  Skin: Skin is warm.  Nursing note and vitals reviewed.    ED Treatments / Results  Labs (all labs ordered are listed, but only abnormal results are displayed) Labs Reviewed  CBC WITH DIFFERENTIAL/PLATELET  PROTIME-INR    EKG  EKG Interpretation None     ED ECG REPORT   Date: 02/15/2016  Rate: 69  Rhythm: normal sinus rhythm  QRS Axis: normal  Intervals: normal  ST/T Wave abnormalities: ST depressions laterally  Conduction Disutrbances:none   Narrative Interpretation:   Old EKG Reviewed: none available  I have personally reviewed the EKG tracing and agree with the computerized printout as noted. EKG did not cross into Epic from [facility].  Radiology No results found.  Procedures Procedures (including critical care time)  Medications Ordered in ED Medications  aspirin chewable tablet 324 mg (not administered)  0.9 %  sodium chloride infusion (not administered)  heparin injection 4,000 Units (not administered)  heparin ADULT infusion 100 units/mL (25000 units/2250mL sodium chloride 0.45%) (not administered)     Initial Impression / Assessment and Plan / ED Course  I have reviewed the triage vital signs and the nursing notes.  Pertinent labs & imaging results that were available during my care of the patient were reviewed by me and considered in my medical decision making (see chart for details).  Clinical Course    Patient brought over to the emergency department after failing cardiac  stress test today. Transfer arranged by cardiology nurse practitioner. Excepting physician at [facility] is Dr. Allyson Sabal. The patient originally was going go directly to the Cath Lab at sounds like now they've made arrangements the patient will go to the CCU and then go to cath lab later today. Patient is currently with only minimal substernal chest pain nothing as severe as occurred with the stress test. Patient had stress test done for concerns for unstable angina. Patient was significant ischemic changes during the stress test. Patient started on heparin here in the emergency department and given aspirin. [facility] will take patient to CCU. EMTALA completed by the cardiology nurse practitioner.  CRITICAL CARE Performed by: Vanetta Mulders Total critical care time: 30 minutes Critical care time was exclusive of separately billable procedures and treating other patients. Critical care was necessary to treat or prevent imminent or life-threatening  deterioration. Critical care was time spent personally by me on the following activities: development of treatment plan with patient and/or surrogate as well as nursing, discussions with consultants, evaluation of patient's response to treatment, examination of patient, obtaining history from patient or surrogate, ordering and performing treatments and interventions, ordering and review of laboratory studies, ordering and review of radiographic studies, pulse oximetry and re-evaluation of patient's condition.    Final Clinical Impressions(s) / ED Diagnoses   Final diagnoses:  Unstable angina [facility])    New Prescriptions New Prescriptions   No medications on file     Vanetta Mulders, MD 02/15/16 1023    Vanetta Mulders, MD 02/15/16 1026

## 2016-02-15 NOTE — H&P (Signed)
CARDIOLOGY HISTORY AND PHYSICAL   Patient ID: Kelsey Hansen MRN: [id number]  DOB/AGE: 48/12/1965 48 y.o. 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  The patient was seen by Dr. Diona Browner on 02/12/2016 with recurrent chest discomfort some improvement with nitroglycerin, with discomfort in her shoulders and arms with intermittent angina. A Lexiscan stress test was ordered for 02/15/2016.  During Lexiscan stress test she had significantly positive ST depression inferior laterally with associated 8/10 chest pain requiring one sublingual nitroglycerin. Pain was relieved after nitroglycerin received. During stress test blood pressure was significantly elevated at 196/100. She did have significant nausea without vomiting. As result of abnormal EKG, Dr. Tenny Craw was informed to review the EKG and confirm significant abnormalities. Dr. Tenny Craw up with the patient about going to catheterization lab at [facility]. She discussed the results of the stress test and her concerns about this being high risk test. The patient is willing to proceed with catheterization.  She was sent to [facility] ER, heparin was started stat labs CBC, BMET, PT/INR are drawn. The patient was given aspirin. [facility] was called and transport is arranged. She will be transferred to stepdown at [facility] while she awaits catheterization.    Allergies  Allergen Reactions  . Aleve [Naproxen] Palpitations  . Crestor [Rosuvastatin]     Pt. Has  a Hx  intolerance  of statin's  . Hydrocodone-Acetaminophen     REACTION: nausea and vomiting  . Lipitor [Atorvastatin]   . Oxycodone-Acetaminophen     REACTION: nausea and vomiting  . Pravachol [Pravastatin]   . Zocor [Simvastatin]        Infusions . sodium chloride      PRN Medications    Past Medical History:  Diagnosis Date  . Anxiety   . Brachial artery occlusion [facility])    Postcatheterization complication, required surgical repair  . Coronary atherosclerosis of native coronary artery    Multivessel  . Hyperlipidemia   . NSTEMI (non-ST elevated myocardial infarction) (HCC) 11/07   PTCA of distal circumflex 2007, residual 50% LAD and 80% PDA  . Peripheral arterial disease (HCC)    Right common and external iliac stent, right to left fem-fem bypass    Past Surgical History:  Procedure Laterality Date  . FEMORAL BYPASS  7/08   Right to left; followed by right external iliac artery stent placed 12/08  . PR VEIN BYPASS GRAFT,AORTO-FEM-POP    . SP [place] TRANSCATHETER OCCLUSION     Occlusion off her fem-fem bypass with claudication in both legs    Family History  Problem Relation Age of Onset  . Cancer Mother     Lung  . Heart disease Mother     Heart Disease before age 82  . Heart attack Mother 68  . Deep vein thrombosis Father   . Arthritis Father     Back  . Varicose Veins Father   . Cancer Sister     Breast  . Diabetes Neg Hx   . Hypertension Neg Hx   . Coronary artery disease Neg Hx     Social  History Kelsey Hansen reports that she has been smoking Cigarettes.  She has a 15.00 pack-year smoking history. She has never used smokeless tobacco. Kelsey Hansen reports that she does not drink alcohol.  Review of Systems Otherwise reviewed and negative except as outlined.  Physical Examination Temp:  [98.1 F (36.7 C)] 98.1 F (36.7 C) (09/01 1006) Pulse Rate:  [79] 79 (09/01 1006) Resp:  [17] 17 (09/01 1006) BP: (164)/(81) 164/81 (09/01 1006) SpO2:  [97 %] 97  % (09/01 1006) Weight:  [148 lb 9.6 oz (67.4 kg)] 148 lb 9.6 oz (67.4 kg) (09/01 1006) No intake or output data in the 24 hours ending 02/15/16 1031  Gen: No acute distress. HEENT: Conjunctiva and lids normal, oropharynx clear with moist mucosa. Neck: Supple, no elevated JVP or carotid bruits, no thyromegaly. Lungs: Clear to auscultation, nonlabored breathing at rest. Cardiac: Regular rate and rhythm, no S3 or significant systolic murmur, no pericardial rub. Abdomen: Soft, nontender, no hepatomegaly, bowel sounds present, no guarding or rebound. Extremities: No pitting edema, distal pulses 2+. Skin: Warm and dry. Musculoskeletal: No kyphosis. Neuropsychiatric: Alert and oriented x3, affect grossly appropriate.   Lab Results  Basic Metabolic Panel: No results for input(s): NA, K, CL, CO2, GLUCOSE, BUN, CREATININE, CALCIUM, MG, PHOS in the last 168 hours.  Liver Function Tests: No results for input(s): AST, ALT, ALKPHOS, BILITOT, PROT, ALBUMIN in the last 168 hours.  CBC: No results for input(s): WBC, NEUTROABS, HGB, HCT, MCV, PLT in the last 168 hours.  Cardiac Enzymes: No results for input(s): CKTOTAL, CKMB, CKMBINDEX, TROPONINI in the last 168 hours.  BNP: Invalid input(s): POCBNP   Radiology No results found.  Prior Cardiac Testing/Procedures:   ECG: During stress test, significant ST depression greater than 2 mm inferior laterally with associated chest pain.   Impression and Recommendations  1. CAD: Known history of PTCA of circumflex in 2007 with residual 50% LAD disease. Positive stress test this a.m. with significant inferior lateral ST depression with associated chest pain. The patient is being transferred to [facility] for cardiac catheterization, heparin will be started with appropriate labs. This is been discussed with the patient by Dr. Tenny Craw to include risks and benefits she is willing to proceed. Patient is currently being transferred to [facility] ER where  heparin and labs are underway. She will transfer to [facility] when bed available to stepdown unit. [facility] is been informed along with Cath Lab.  Of note, she is intolerant to statin therapy. At this point continue Plavix aspirin carvedilol. Isosorbide has not been provided as nitroglycerin drip may be necessary if pain is recurrent.  2. Hypertension: Blood pressure was elevated during stress test, normalized after sublingual nitroglycerin. This will need to be monitored.  3. Anxiety: Can be addressed at [facility] should this become an issue.   Signed: Bettey Mare. Lawrence NP [facility]  02/15/2016, 10:31 AM Co-Sign MD  Pt seen and examined  I agree with findings of K Lawrence above  Pt presented for [employer] today for eval of CP  During infusion she developed significant chest pain  EKG with significant ST depression in the inferior and lateral leads concerning for ischemia. Discussed with pt ON exam she is currently pain free  Lungs CTA  Cardiac RRR  No S3 or S4  Ext without edema I would recomm tx to [facility] for L heart cath today   Pt has had cardiac intervention in past as noted  Agrees with recommendation   Will start  heparin prior to tx.  Tx to ED.  Dietrich PatesPaula Ross

## 2016-02-15 NOTE — Progress Notes (Signed)
ANTICOAGULATION CONSULT NOTE - Follow Up Consult  Pharmacy Consult for Heparin  Indication: CAD, awaiting CABG  Allergies  Allergen Reactions  . Aleve [Naproxen] Palpitations  . Crestor [Rosuvastatin] Other (See Comments)    Joint & muscle aches and pain  . Lipitor [Atorvastatin] Other (See Comments)    Joint & muscle aches and pain  . Pravachol [Pravastatin] Other (See Comments)    Joint & muscle aches and pain  . Zocor [Simvastatin] Other (See Comments)    Joint & muscle aches and pain  . Hydrocodone-Acetaminophen Nausea And Vomiting  . Oxycodone-Acetaminophen Nausea And Vomiting    Patient Measurements: Height: 5\' 2"  (157.5 cm) Weight: 148 lb 9.6 oz (67.4 kg) IBW/kg (Calculated) : 50.1 Heparin Dosing Weight: 67.4 kg  Vital Signs: Temp: 98.4 F (36.9 C) (09/01 1200) Temp Source: Oral (09/01 1200) BP: 172/78 (09/01 1500) Pulse Rate: 66 (09/01 1500)  Labs:  Recent Labs  02/15/16 1018 02/15/16 1042 02/15/16 1130  HGB 13.2  --   --   HCT 37.9  --   --   PLT 319  --   --   CREATININE  --   --  0.56  TROPONINI  --  <0.03  --     Estimated Creatinine Clearance: 77.4 mL/min (by C-G formula based on SCr of 0.8 mg/dL).  Assessment:  s/p cardiac cath.  Severe multi-vessel CAD.  Heparin drip to resume 8 hr after sheath out.  Radial band off ~5:45pm.  No bleeding or hematoma noted.   Plavix discontinued, last dose today.  For CABG on 9/6 after Plavix washout.  Heparin drip had only run for a few hours before it was off for cath, so no heparin levels have been done yet.  Goal of Therapy:  Heparin level 0.3-0.7 units/ml Monitor platelets by anticoagulation protocol: Yes   Plan:   Resume heparin drip at 800 units/hr at 2am on 02/16/16.  Heparin level ~6 hrs after drip resumes.  Daily heparin level and CBC while on heparin.  Dennie FettersEgan, Annie Roseboom Donovan, RPh Pager: [phone] 02/15/2016,6:57 PM

## 2016-02-15 NOTE — ED Triage Notes (Signed)
Denies chest pain 

## 2016-02-15 NOTE — Progress Notes (Addendum)
Called to room by patient c/o rt arm hurting.  Got to patient's room her radial site was bleeding.  Patient states  She went to restroom, used that arm to reach for the toliet paper states she felt a pop with it then pain and bleeding.  Held pressure on site for 10 minutes, applied new pressure dressing.  No signs of bleeding, site was bruised.  Will continue to monitor.  Patient medicated with pain meds

## 2016-02-15 NOTE — Interval H&P Note (Signed)
Cath Lab Visit (complete for each Cath Lab visit)  Clinical Evaluation Leading to the Procedure:   ACS: Yes.    Non-ACS:    Anginal Classification: CCS Hansen  Anti-ischemic medical therapy: Maximal Therapy (2 or more classes of medications)  Non-Invasive Test Results: High-risk stress test findings: cardiac mortality >3%/year  Prior CABG: No previous CABG      History and Physical Interval Note:  02/15/2016 1:42 PM  Kelsey Hansen  has presented today for surgery, with the diagnosis of cp  The various methods of treatment have been discussed with the patient and family. After consideration of risks, benefits and other options for treatment, the patient has consented to  Procedure(s): Left Heart Cath and Coronary Angiography (N/A) as a surgical intervention .  The patient's history has been reviewed, patient examined, no change in status, stable for surgery.  I have reviewed the patient's chart and labs.  Questions were answered to the patient's satisfaction.     Lyn RecordsHenry W Smith Hansen

## 2016-02-15 NOTE — Consult Note (Signed)
[address]       [place] [address]             [phone]        Kelsey Hansen [facility] Medical Record #[id number] Date of Birth: 1965-07-20  Referring: Verdis Prime M.D., Nona Dell M.D. Primary Care: No PCP Per Patient  Chief Complaint:    Chief Complaint  Patient presents with  . Chest Pain  Patient examined, coronary arteriograms personally reviewed and counseled with patient  History of Present Illness:      50 year old Caucasian female nondiabetic smoker presents with symptoms of unstable angina and a markedly positive stress test. Cardiac catheterization was performed today which found severe multivessel coronary disease and patient was admitted. She has a 90% proximal LAD stenosis, 90% stenosis of the mid RCA and 85% stenosis of a ramus intermediate branch of the circumflex. LVEDP is normal. LV ejection fraction is normal. Transthoracic echocardiogram is pending.  The patient has a significant history of heart disease. In 2007 she had an acute MI with circumflex occlusion treated with PCI. At that time her brachial artery was cannulated and required surgical repair. Subsequent to her MI she developed severe lower extremity ischemia requiring femorofemoral bypass and stents placed in the right common iliac and external iliac. She continues to smoke and has a heavy smoking history. Her ABIs last year were proximal to 0.8-0.9. Her last carotid Dopplers last year were with mild-moderate disease.  Because of the patient's severe multivessel CAD  A CT surgical evaluation is requested. The patient could be treated with PCI to her small LAD but a left IMA graft toLAD would probably be  her best long-term option.  Current Activity/ Functional Status: Patient is married Patient is disabled She is able to do her household at activities   Zubrod Score: At the time of surgery this patient's most appropriate activity status/level should be described as: []      0    Normal activity, no symptoms []     1    Restricted in physical strenuous activity but ambulatory, able to do out light work [x]     2    Ambulatory and capable of self care, unable to do work activities, up and about                 more than 50%  Of the time                            []     3    Only limited self care, in bed greater than 50% of waking hours []     4    Completely disabled, no self care, confined to bed or chair []     5    Moribund  Past Medical History:  Diagnosis Date  . Anxiety   . Brachial artery occlusion [facility])    Postcatheterization complication, required surgical repair  . Coronary atherosclerosis of native coronary artery    Multivessel  . Hyperlipidemia   . NSTEMI (non-ST elevated myocardial infarction) (HCC) 11/07   PTCA of distal circumflex 2007, residual 50% LAD and 80% PDA  . Peripheral arterial disease (HCC)    Right common and external iliac stent, right to left fem-fem bypass    Past Surgical History:  Procedure Laterality Date  . CARDIAC CATHETERIZATION  02/15/2016  . FEMORAL BYPASS  7/08   Right to left; followed by right  external iliac artery stent placed 12/08  . PR VEIN BYPASS GRAFT,AORTO-FEM-POP    . SP [place] TRANSCATHETER OCCLUSION     Occlusion off her fem-fem bypass with claudication in both legs    History  Smoking Status  . Current Every Day Smoker  . Packs/day: 0.50  . Years: 30.00  . Types: Cigarettes  Smokeless Tobacco  . Never Used    Comment: 10 ciggs per day, started back    History  Alcohol Use No    Social History   Social History  . Marital status: Divorced    Spouse name: N/A  . Number of children: N/A  . Years of education: N/A   Occupational History  . Not on file.   Social History Main Topics  . Smoking status: Current Every Day Smoker    Packs/day: 0.50    Years: 30.00    Types: Cigarettes  . Smokeless tobacco: Never Used     Comment: 10 ciggs per day, started back  . Alcohol use No  . Drug  use: No  . Sexual activity: Not on file   Other Topics Concern  . Not on file   Social History Narrative  . No narrative on file    Allergies  Allergen Reactions  . Aleve [Naproxen] Palpitations  . Crestor [Rosuvastatin] Other (See Comments)    Joint & muscle aches and pain  . Lipitor [Atorvastatin] Other (See Comments)    Joint & muscle aches and pain  . Pravachol [Pravastatin] Other (See Comments)    Joint & muscle aches and pain  . Zocor [Simvastatin] Other (See Comments)    Joint & muscle aches and pain  . Hydrocodone-Acetaminophen Nausea And Vomiting  . Oxycodone-Acetaminophen Nausea And Vomiting    Current Facility-Administered Medications  Medication Dose Route Frequency Provider Last Rate Last Dose  . 0.9 %  sodium chloride infusion  250 mL Intravenous PRN Lyn Records, MD      . 0.9% sodium chloride infusion  3 mL/kg/hr Intravenous Continuous Lyn Records, MD 202.2 mL/hr at 02/15/16 1500 3 mL/kg/hr at 02/15/16 1500  . acetaminophen (TYLENOL) tablet 650 mg  650 mg Oral Q4H PRN Lyn Records, MD      . ALPRAZolam Prudy Feeler) tablet 0.25 mg  0.25 mg Oral TID PRN Kerin Perna, MD      . aspirin chewable tablet 81 mg  81 mg Oral Daily Lyn Records, MD      . aspirin EC tablet 81 mg  81 mg Oral Daily Jodelle Gross, NP      . carvedilol (COREG) tablet 12.5 mg  12.5 mg Oral BID Jodelle Gross, NP      . ipratropium-albuterol (DUONEB) 0.5-2.5 (3) MG/3ML nebulizer solution 3 mL  3 mL Nebulization Q6H Kerin Perna, MD      . mometasone-formoterol [facility]) 100-5 MCG/ACT inhaler 2 puff  2 puff Inhalation BID Kerin Perna, MD      . morphine 2 MG/ML injection 1-2 mg  1-2 mg Intravenous Q2H PRN Lyn Records, MD   2 mg at 02/15/16 1603  . morphine 2 MG/ML injection           . ondansetron (ZOFRAN) injection 4 mg  4 mg Intravenous Q6H PRN Lyn Records, MD      . sodium chloride flush (NS) 0.9 % injection 3 mL  3 mL Intravenous Q12H Lyn Records, MD      . sodium  chloride flush (NS)  0.9 % injection 3 mL  3 mL Intravenous PRN Lyn RecordsHenry W Smith, MD       Facility-Administered Medications Ordered in Other Encounters  Medication Dose Route Frequency Provider Last Rate Last Dose  . nitroGLYCERIN (NITROSTAT) SL tablet 0.4 mg  0.4 mg Sublingual Q5 min PRN Jodelle GrossKathryn M Lawrence, NP   0.4 mg at 02/15/16 [id number]    Prescriptions Prior to Admission  Medication Sig Dispense Refill Last Dose  . aspirin EC 81 MG tablet Take 162 mg by mouth at bedtime.   02/14/2016 at Unknown time  . carvedilol (COREG) 12.5 MG tablet TAKE 1 TABLET BY MOUTH TWICE A DAY 180 tablet 3 02/15/2016 at 700  . isosorbide mononitrate (IMDUR) 60 MG 24 hr tablet TAKE 3 TABLETS BY MOUTH EVERY DAY 90 tablet 6 02/15/2016 at Unknown time  . nitroGLYCERIN (NITROSTAT) 0.4 MG SL tablet PLACE 1 TABLET UNDER TONGUE EVERY 5 MINUTES AS NEEDED. (Patient taking differently: PLACE 1 TABLET UNDER TONGUE EVERY 5 MINUTES AS NEEDED FOR CHEST PAIN) 25 tablet 3 02/13/2016  . PLAVIX 75 MG tablet TAKE ONE TABLET BY MOUTH DAILY (Patient taking differently: TAKE ONE TABLET BY MOUTH DAILY AFTER SUPPER) 30 tablet 6 02/14/2016 at Unknown time  . ibuprofen (ADVIL,MOTRIN) 200 MG tablet Take 800-1,200 mg by mouth every 6 (six) hours as needed (pain).   02/13/2016    Family History  Problem Relation Age of Onset  . Cancer Mother     Lung  . Heart disease Mother     Heart Disease before age 48  . Heart attack Mother 2542  . Deep vein thrombosis Father   . Arthritis Father     Back  . Varicose Veins Father   . Cancer Sister     Breast  . Diabetes Neg Hx   . Hypertension Neg Hx   . Coronary artery disease Neg Hx      Review of Systems:       Cardiac Review of Systems: Y or N  Chest Pain [  yes  ]  Resting SOB [ no  ] Exertional SOB  [ yes ]  Diego Coryrthopnea [no  ]   Pedal Edema [  no  ]    Palpitations [no  ] Syncope  [ no ]   Presyncope [no   ]  General Review of Systems: [Y] = yes [  ]=no Constitional: recent weight change [  ];  anorexia [  ]; fatigue [  ]; nausea [  ]; night sweats [  ]; fever [  ]; or chills [  ]                                                               Dental: poor dentition[  ]; Last Dentist visit: Edentulous  Eye : blurred vision [  ]; diplopia [   ]; vision changes [  ];  Amaurosis fugax[  ]; Resp: cough Mahler.Beck[yes  ];  wheezing[  ];  hemoptysis[  ]; shortness of breath[  ]; paroxysmal nocturnal dyspnea[  ]; dyspnea on exertion[  ]; or orthopnea[  ]; chronic cough from smoking GI:  gallstones[  ], vomiting[  ];  dysphagia[  ]; melena[  ];  hematochezia [  ]; heartburn[  ];   Hx of  Colonoscopy[  ];  GU: kidney stones [  ]; hematuria[  ];   dysuria [  ];  nocturia[  ];  history of     obstruction [  ]; urinary frequency [  ]             Skin: rash, swelling[  ];, hair loss[  ];  peripheral edema[  ];  or itching[  ]; Musculosketetal: myalgias[  ];  joint swelling[  ];  joint erythema[  ];  joint pain[  ];  back pain[ yes right lower back];  Heme/Lymph: bruising[  ];  bleeding[  ];  anemia[  ];  Neuro: TIA[  ];  headaches[ yes intermittent  ];  stroke[  ];  vertigo[  ];  seizures[  ];   paresthesias[  ];  difficulty walking[  ];  Psych:depression[  ]; anxiety[  yes];  Endocrine: diabetes[  ];  thyroid dysfunction[  ];  Immunizations: Flu [  ]; Pneumococcal[  ];  Other: Right-hand dominant                         No history of thoracic trauma pneumothorax or rib fracture  Physical Exam: BP (!) 172/78 (BP Location: Left Arm)   Pulse 66   Temp 98.4 F (36.9 C) (Oral)   Resp 12   Ht 5\' 2"  (1.575 m)   Wt 148 lb 9.6 oz (67.4 kg)   SpO2 98%   BMI 27.18 kg/m        Physical Exam  General: Well-nourished middle-aged Caucasian female appears older than her stated age, smoker HEENT: Normocephalic pupils equal , dentition adequate Neck: Supple without JVD, adenopathy, or bruit Chest: Clear to auscultation, symmetrical breath sounds, no rhonchi, no tenderness             or deformity Cardiovascular:  Regular rate and rhythm, no murmur, no gallop, peripheral pulses             palpable in all extremities Abdomen:  Soft, nontender, no palpable mass or organomegaly Extremities: Warm, well-perfused, no clubbing cyanosis edema or tenderness, well-healed bilateral groin incisions from femorofemoral bypass. Compression device on right wrist from right radial artery catheterization today              no venous stasis changes of the legs Rectal/GU: Deferred Neuro: Grossly non--focal and symmetrical throughout Skin: Clean and dry without rash or ulceration    Diagnostic Studies & Laboratory data:     Recent Radiology Findings:   Nm Myocar Multi W/spect W/wall Motion / Ef  Result Date: 02/15/2016  Blood pressure demonstrated a hypertensive response to exercise.  Downsloping ST segment depression ST segment depression of 3 mm was noted during stress in the II, III, aVF, V3, V4, V5 and V6 leads. Incomplete normalization in recovery  Pt transferred to [facility] for cardiac catheterization    Dg Chest Port 1 View  Result Date: 02/15/2016 CLINICAL DATA:  Chest pain EXAM: PORTABLE CHEST 1 VIEW COMPARISON:  December 28, 2006 FINDINGS: Lungs are clear. Heart size and pulmonary vascularity are normal. No adenopathy. There is a focal area of calcification in the aortic arch. No bone lesions. No pneumothorax. IMPRESSION: Small focus of aortic atherosclerosis.  No edema or consolidation. Electronically Signed   By: Bretta Bang III M.D.   On: 02/15/2016 10:42     I have independently reviewed the above radiologic studies.  Recent Lab Findings: Lab Results  Component Value Date   WBC 12.6 (H)  02/15/2016   HGB 13.2 02/15/2016   HCT 37.9 02/15/2016   PLT 319 02/15/2016   GLUCOSE 135 (H) 02/15/2016   CHOL 232 (H) 05/03/2015   TRIG 107 05/03/2015   HDL 44 05/03/2015   LDLDIRECT 96.7 10/19/2006   LDLCALC 167 (H) 05/03/2015   ALT 12 05/17/2007   AST 17 05/17/2007   NA 137 02/15/2016   K 3.5  02/15/2016   CL 102 02/15/2016   CREATININE 0.56 02/15/2016   BUN 7 02/15/2016   CO2 26 02/15/2016   INR 0.9 05/17/2007      Assessment / Plan:     This young patient has severe multivessel CAD    Her RCA ramus and LAD are adequate targets for grafting     She appears to have adequate conduit     She needs Plavix washout with IV heparin bridge until her surgery which would be scheduled for Wednesday morning September 6. I discussed the procedure of CABG with the patient and her husband including the expected benefits alternatives and potential risks. She agrees with plan for CABG.       @ME1 @ 02/15/2016 5:54 PM

## 2016-02-15 NOTE — Progress Notes (Signed)
TR BAND REMOVAL  LOCATION:    right radial  DEFLATED PER PROTOCOL:    Yes.    TIME BAND OFF / DRESSING APPLIED:    1730   SITE UPON ARRIVAL:    Level 0  SITE AFTER BAND REMOVAL:    Level 0  CIRCULATION SENSATION AND MOVEMENT:    Within Normal Limits   Yes.    COMMENTS:   Tolerated procedure well, post TRB instructions given

## 2016-02-15 NOTE — Care Management Note (Signed)
Case Management Note  Patient Details  Name: Gaston Islamracy L Belshe MRN: [id number] Date of Birth: 06-Feb-1966  Subjective/Objective:   Patient s/p cath, will be on plavix, NCM will cont to follow for dc needs.                 Action/Plan:   Expected Discharge Date:                  Expected Discharge Plan:  Home/Self Care  In-House Referral:     Discharge planning Services  CM Consult  Post Acute Care Choice:    Choice offered to:     DME Arranged:    DME Agency:     HH Arranged:    HH Agency:     Status of Service:  Completed, signed off  If discussed at [employer] Length of Stay Meetings, dates discussed:    Additional Comments:  Leone Havenaylor, Lyrik Dockstader Clinton, RN 02/15/2016, 4:31 PM

## 2016-02-15 NOTE — ED Triage Notes (Signed)
Patient brought over from [facility] with a failed stress test and needs transfer to [facility] for cardiac catheterization.

## 2016-02-15 NOTE — Progress Notes (Signed)
ANTICOAGULATION CONSULT NOTE - Initial Consult  Pharmacy Consult for Heparin Indication: chest pain/ACS  Allergies  Allergen Reactions  . Aleve [Naproxen] Palpitations  . Crestor [Rosuvastatin]     Pt. Has a Hx  intolerance  of statin's  . Hydrocodone-Acetaminophen     REACTION: nausea and vomiting  . Lipitor [Atorvastatin]   . Oxycodone-Acetaminophen     REACTION: nausea and vomiting  . Pravachol [Pravastatin]   . Zocor [Simvastatin]     Patient Measurements: Height: 5\' 2"  (157.5 cm) Weight: 148 lb 9.6 oz (67.4 kg) IBW/kg (Calculated) : 50.1 HEPARIN DW (KG): 64.1  Vital Signs: Temp: 98.1 F (36.7 C) (09/01 1050) Temp Source: Oral (09/01 1006) BP: 149/67 (09/01 1050) Pulse Rate: 59 (09/01 1036)  Labs:  Recent Labs  02/15/16 1018  HGB 13.2  HCT 37.9  PLT 319    CrCl cannot be calculated (Patient's most recent lab result is older than the maximum 21 days allowed.).   Medical History: Past Medical History:  Diagnosis Date  . Anxiety   . 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 Coronary atherosclerosis of native coronary artery    Multivessel  . Hyperlipidemia   . NSTEMI (non-ST elevated myocardial infarction) (HCC) 11/07   PTCA of distal circumflex 2007, residual 50% LAD and 80% PDA  . Peripheral arterial disease (HCC)    Right common and external iliac stent, right to left fem-fem bypass    Medications:  See med rec Assessment: 50 yo female presents with chest pain. Patient brought to ED after failing stress test. 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 Heparin to  start and plan to [facility]  Goal of Therapy:  Heparin level 0.3-0.7 units/ml Monitor platelets by anticoagulation protocol: Yes   Plan:  Give 4000 units bolus x 1 Start heparin infusion at 800 units/hr Check anti-Xa level in 6 hours and daily while on heparin Continue to monitor H&H and platelets   Elder CyphersLorie Novalyn Lajara, BS Loura BackPharm D, BCPS Clinical Pharmacist Pager [phone]  02/15/2016,11:47 AM

## 2016-02-15 NOTE — ED Notes (Signed)
Report also given to Conservator, museum/galleryeri RN at [facility].

## 2016-02-16 ENCOUNTER — Other Ambulatory Visit (HOSPITAL_COMMUNITY): Payer: Medicare Other

## 2016-02-16 ENCOUNTER — Other Ambulatory Visit: Payer: Self-pay | Admitting: Cardiology

## 2016-02-16 ENCOUNTER — Inpatient Hospital Stay (HOSPITAL_COMMUNITY): Payer: Medicare Other

## 2016-02-16 DIAGNOSIS — I2510 Atherosclerotic heart disease of native coronary artery without angina pectoris: Secondary | ICD-10-CM

## 2016-02-16 DIAGNOSIS — E782 Mixed hyperlipidemia: Secondary | ICD-10-CM

## 2016-02-16 DIAGNOSIS — I10 Essential (primary) hypertension: Secondary | ICD-10-CM

## 2016-02-16 DIAGNOSIS — F17200 Nicotine dependence, unspecified, uncomplicated: Secondary | ICD-10-CM

## 2016-02-16 LAB — CBC
HCT: 35.7 % — ABNORMAL LOW (ref 36.0–46.0)
HEMOGLOBIN: 12 g/dL (ref 12.0–15.0)
MCH: 31.3 pg (ref 26.0–34.0)
MCHC: 33.6 g/dL (ref 30.0–36.0)
MCV: 93.2 fL (ref 78.0–100.0)
Platelets: 291 10*3/uL (ref 150–400)
RBC: 3.83 MIL/uL — ABNORMAL LOW (ref 3.87–5.11)
RDW: 12.4 % (ref 11.5–15.5)
WBC: 9.4 10*3/uL (ref 4.0–10.5)

## 2016-02-16 LAB — BASIC METABOLIC PANEL
Anion gap: 7 (ref 5–15)
CALCIUM: 9.1 mg/dL (ref 8.9–10.3)
CHLORIDE: 109 mmol/L (ref 101–111)
CO2: 26 mmol/L (ref 22–32)
CREATININE: 0.51 mg/dL (ref 0.44–1.00)
GFR calc non Af Amer: >60 mL/min (ref >60)
Glucose, Bld: 121 mg/dL — ABNORMAL HIGH (ref 65–99)
Potassium: 3.3 mmol/L — ABNORMAL LOW (ref 3.5–5.1)
SODIUM: 142 mmol/L (ref 135–145)

## 2016-02-16 LAB — SURGICAL PCR SCREEN
MRSA, PCR: NEGATIVE
Staphylococcus aureus: NEGATIVE

## 2016-02-16 LAB — TROPONIN I
Troponin I: <0.03 ng/mL (ref <0.03)
Troponin I: <0.03 ng/mL (ref <0.03)
Troponin I: <0.03 ng/mL (ref <0.03)

## 2016-02-16 LAB — ECHOCARDIOGRAM COMPLETE
Height: 62 in
Weight: 2315.2 oz

## 2016-02-16 LAB — HEPARIN LEVEL (UNFRACTIONATED)
HEPARIN UNFRACTIONATED: 0.11 [IU]/mL — AB (ref 0.30–0.70)
HEPARIN UNFRACTIONATED: 0.37 [IU]/mL (ref 0.30–0.70)
Heparin Unfractionated: 0.13 IU/mL — ABNORMAL LOW (ref 0.30–0.70)

## 2016-02-16 LAB — PLATELET INHIBITION P2Y12: Platelet Function  P2Y12: 175 [PRU] — ABNORMAL LOW (ref 194–418)

## 2016-02-16 LAB — TSH: TSH: 4.034 u[IU]/mL (ref 0.350–4.500)

## 2016-02-16 MED ORDER — METHOCARBAMOL 1000 MG/10ML IJ SOLN: 500.0000 mg | Freq: Four times a day (QID) | INTRAVENOUS | Status: DC | PRN

## 2016-02-16 MED ORDER — ALPRAZOLAM 0.25 MG PO TABS
0.2500 mg | ORAL_TABLET | Freq: Once | ORAL | Status: AC | PRN
  Administered 2016-02-16: 0.25 mg via ORAL

## 2016-02-16 MED ORDER — HEPARIN BOLUS VIA INFUSION
2000.0000 [IU] | Freq: Once | INTRAVENOUS | Status: DC
  Filled 2016-02-16: qty 2000

## 2016-02-16 MED ORDER — IOPAMIDOL (ISOVUE-300) INJECTION 61%
INTRAVENOUS | Status: AC
  Filled 2016-02-16: qty 75

## 2016-02-16 MED ORDER — IPRATROPIUM-ALBUTEROL 0.5-2.5 (3) MG/3ML IN SOLN: 3.0000 mL | Freq: Four times a day (QID) | RESPIRATORY_TRACT | Status: DC | PRN

## 2016-02-16 MED ORDER — CARVEDILOL 12.5 MG PO TABS: 25.0000 mg | ORAL_TABLET | Freq: Two times a day (BID) | ORAL | Status: DC

## 2016-02-16 MED ORDER — ALPRAZOLAM 0.5 MG PO TABS
0.5000 mg | ORAL_TABLET | Freq: Three times a day (TID) | ORAL | Status: DC | PRN
  Administered 2016-02-16 – 2016-02-19 (×6): 0.5 mg via ORAL
  Filled 2016-02-16 (×6): qty 1

## 2016-02-16 MED ORDER — HEPARIN (PORCINE) IN NACL 100-0.45 UNIT/ML-% IJ SOLN
1050.0000 [IU]/h | INTRAMUSCULAR | Status: DC
  Administered 2016-02-16 – 2016-02-18 (×3): 1050 [IU]/h via INTRAVENOUS
  Filled 2016-02-16 (×3): qty 250

## 2016-02-16 MED ORDER — NITROGLYCERIN IN D5W 200-5 MCG/ML-% IV SOLN
2.0000 ug/min | INTRAVENOUS | Status: DC
  Administered 2016-02-16 (×2): 10 ug/min via INTRAVENOUS
  Administered 2016-02-16: 5 ug/min via INTRAVENOUS
  Administered 2016-02-19 – 2016-02-20 (×2): 10 ug/min via INTRAVENOUS
  Filled 2016-02-16 (×2): qty 250

## 2016-02-16 MED ORDER — CARVEDILOL 12.5 MG PO TABS
12.5000 mg | ORAL_TABLET | Freq: Two times a day (BID) | ORAL | Status: DC
  Administered 2016-02-16 – 2016-02-19 (×7): 12.5 mg via ORAL
  Filled 2016-02-16 (×7): qty 1

## 2016-02-16 MED ORDER — METHOCARBAMOL 500 MG PO TABS
500.0000 mg | ORAL_TABLET | Freq: Four times a day (QID) | ORAL | Status: DC | PRN
  Administered 2016-02-16 (×2): 500 mg via ORAL
  Filled 2016-02-16 (×2): qty 1

## 2016-02-16 MED ORDER — POTASSIUM CHLORIDE CRYS ER 20 MEQ PO TBCR
40.0000 meq | EXTENDED_RELEASE_TABLET | Freq: Once | ORAL | Status: AC
  Administered 2016-02-16: 40 meq via ORAL
  Filled 2016-02-16: qty 2

## 2016-02-16 NOTE — Progress Notes (Signed)
ANTICOAGULATION CONSULT NOTE - Follow Up Consult  Pharmacy Consult for Heparin  Indication: Awaiting CABG 9/6  Allergies  Allergen Reactions  . Aleve [Naproxen] Palpitations  . Crestor [Rosuvastatin] Other (See Comments)    Joint & muscle aches and pain  . Lipitor [Atorvastatin] Other (See Comments)    Joint & muscle aches and pain  . Pravachol [Pravastatin] Other (See Comments)    Joint & muscle aches and pain  . Zocor [Simvastatin] Other (See Comments)    Joint & muscle aches and pain  . Hydrocodone-Acetaminophen Nausea And Vomiting  . Oxycodone-Acetaminophen Nausea And Vomiting    Patient Measurements: Height: 5\' 2"  (157.5 cm) Weight: 148 lb 9.6 oz (67.4 kg) IBW/kg (Calculated) : 50.1  Vital Signs: Temp: 97.9 F (36.6 C) (09/02 0725) Temp Source: Oral (09/02 0725) BP: 170/66 (09/02 0725) Pulse Rate: 47 (09/02 0730)  Labs:  Recent Labs  02/15/16 1018 02/15/16 1042 02/15/16 1130 02/16/16 0505 02/16/16 0933  HGB 13.2  --   --  12.0  --   HCT 37.9  --   --  35.7*  --   PLT 319  --   --  291  --   HEPARINUNFRC  --   --   --  0.11* 0.13*  CREATININE  --   --  0.56 0.51  --   TROPONINI  --  <0.03  --   --  <0.03    Estimated Creatinine Clearance: 77.4 mL/min (by C-G formula based on SCr of 0.8 mg/dL).   Assessment: - Post cardiac cath with multi-vessel CAD. Heparin restarted at at 2 am 9/1 at 800/hr. Initial heparin level drawn 3 hours early was subtherapeutic. 1000 re-check still subtherapeutic at 0.13. Do not rebolus d/t being <24 hours post cath, but increase rate.  H/H trending down slightly, but wnl. PLTC dropped a little, though wnl. CABG planned for 9/6.   Goal of Therapy:  Heparin level 0.3-0.7 units/ml Monitor platelets by anticoagulation protocol: Yes   Plan:  Increase to 1050 units/hr Check anti-Xa level in 6 hours and daily while on heparin Continue to monitor H&H and platelets   Alfredo BachJoseph Dariusz Brase, Cleotis NipperBS, PharmD Clinical Pharmacy  Resident [phone] (Pager) 02/16/2016 11:20 AM

## 2016-02-16 NOTE — Progress Notes (Signed)
Patient Name: Kelsey Hansen Date of Encounter: 02/16/2016  Active Gaston Islamroblems:   TOBACCO ABUSE   Coronary atherosclerosis of native coronary artery   Essential hypertension, benign   Mixed hyperlipidemia   PVD (peripheral vascular disease) with claudication (HCC)   Unstable angina (HCC)   Coronary artery disease   Primary Cardiologist: Dr Diona BrownerMcDowell  Patient Profile: 50 y.o.female with known history of coronary artery disease, multivessel, with PTCA of the distal circumflex in 2007 and residual 50% LAD and 80% PDA, peripheral arterial disease with right common and external iliac stent right to left femorofemoral bypass, hyperlipidemia, brachial artery occlusion post catheterization complication requiring surgical repair, anxiety, and hypertension. CP>MV abnl ECG>AP ER>[facility], cath w/ 3v dz, CABG planned next week after Plavix washout  SUBJECTIVE: Main problem is back pain, scared of the surgery, very anxious. Doesn't want a cigarette anymore.  OBJECTIVE Vitals:   02/16/16 0217 02/16/16 0537 02/16/16 0725 02/16/16 0730  BP:  (!) 166/90 (!) 170/66   Pulse:  (!) 50  (!) 47  Resp:   (!) 22 (!) 21  Temp:  97.8 F (36.6 C) 97.9 F (36.6 C)   TempSrc:   Oral   SpO2: 95% 96%  98%  Weight:      Height:        Intake/Output Summary (Last 24 hours) at 02/16/16 0843 Last data filed at 02/16/16 0500  Gross per 24 hour  Intake              240 ml  Output             2100 ml  Net            -1860 ml   Filed Weights   02/15/16 0959 02/15/16 1006  Weight: 148 lb 9.6 oz (67.4 kg) 148 lb 9.6 oz (67.4 kg)    PHYSICAL EXAM General: Well developed, well nourished, female in no acute distress. Head: Normocephalic, atraumatic.  Neck: Supple without bruits, JVD not elevated. Lungs:  Resp regular and unlabored, CTA. Heart: RRR, S1, S2, no S3, S4, or murmur; no rub. Abdomen: Soft, non-tender, non-distended, BS + x 4.  Extremities: No clubbing, cyanosis, edema. R radial cath site has  ecchymosis and is tender Neuro: Alert and oriented X 3. Moves all extremities spontaneously. Psych: Normal affect.  LABS: CBC: Recent Labs  02/15/16 1018 02/16/16 0505  WBC 12.6* 9.4  NEUTROABS 7.8*  --   HGB 13.2 12.0  HCT 37.9 35.7*  MCV 92.4 93.2  PLT 319 291   Basic Metabolic Panel: Recent Labs  02/15/16 1130 02/16/16 0505  NA 137 142  K 3.5 3.3*  CL 102 109  CO2 26 26  GLUCOSE 135* 121*  BUN 7 <5*  CREATININE 0.56 0.51  CALCIUM 9.2 9.1   Cardiac Enzymes: Recent Labs  02/15/16 1042  TROPONINI <0.03   Thyroid Function Tests: Recent Labs  02/16/16 0505  TSH 4.034   TELE:  SR       Radiology/Studies: Nm Myocar Multi W/spect W/wall Motion / Ef Result Date: 02/15/2016  Blood pressure demonstrated a hypertensive response to exercise.  Downsloping ST segment depression ST segment depression of 3 mm was noted during stress in the II, III, aVF, V3, V4, V5 and V6 leads. Incomplete normalization in recovery  [facility]    Dg Chest Port 1 View Result Date: 02/15/2016 CLINICAL DATA:  Chest pain EXAM: PORTABLE CHEST 1 VIEW COMPARISON:  December 28, 2006 FINDINGS: Lungs are clear. Heart size and pulmonary vascularity are normal. No adenopathy. There is a focal area of calcification in the aortic arch. No bone lesions. No pneumothorax. IMPRESSION: Small focus of aortic atherosclerosis.  No edema or consolidation. Electronically Signed   By: Bretta Bang III M.D.   On: 02/15/2016 10:42     Current Medications:  . aspirin  81 mg Oral Daily  . aspirin EC  81 mg Oral Daily  . carvedilol  12.5 mg Oral BID  . ipratropium-albuterol  3 mL Nebulization Q6H  . mometasone-formoterol  2 puff Inhalation BID  . sodium chloride flush  3 mL Intravenous Q12H   . heparin 800 Units/hr (02/16/16 0146)    ASSESSMENT AND PLAN:    Unstable angina (HCC)   Coronary artery disease - for CABG next week - add IV NTG for chest pain -  continue heparin, ASA, BB - Pt with intolerance to statins, consider Lipid clinic referral as OP for PCSK9  Active Problems:   TOBACCO ABUSE - Says does not need nicotine patch    Coronary atherosclerosis of native coronary artery - see above    Essential hypertension, benign Increase BB    Mixed hyperlipidemia - see above    PVD (peripheral vascular disease) with claudication (HCC)    Signed, Theodore Demark , PA-C 8:43 AM 02/16/2016

## 2016-02-16 NOTE — Progress Notes (Signed)
[id number] Notified by pt's RN that pt wants to leave and unsure about whether she will have surgery or not. Will hold seeing pt until confirmed that she is going to have surgery.  Will continue to follow. Luetta NuttingCharlene Evelisse Szalkowski RN BSN 02/16/2016 8:46 AM

## 2016-02-16 NOTE — Progress Notes (Signed)
ANTICOAGULATION CONSULT NOTE - Follow Up Consult  Pharmacy Consult for Heparin  Indication: Awaiting CABG 9/6  Allergies  Allergen Reactions  . Aleve [Naproxen] Palpitations  . Crestor [Rosuvastatin] Other (See Comments)    Joint & muscle aches and pain  . Lipitor [Atorvastatin] Other (See Comments)    Joint & muscle aches and pain  . Pravachol [Pravastatin] Other (See Comments)    Joint & muscle aches and pain  . Zocor [Simvastatin] Other (See Comments)    Joint & muscle aches and pain  . Hydrocodone-Acetaminophen Nausea And Vomiting  . Oxycodone-Acetaminophen Nausea And Vomiting    Patient Measurements: Height: 5\' 2"  (157.5 cm) Weight: 144 lb 11.2 oz (65.6 kg) IBW/kg (Calculated) : 50.1  Vital Signs: Temp: 98.2 F (36.8 C) (09/02 1539) Temp Source: Oral (09/02 1539) BP: 102/66 (09/02 1639) Pulse Rate: 44 (09/02 1204)  Labs:  Recent Labs  02/15/16 1018 02/15/16 1042 02/15/16 1130 02/16/16 0505 02/16/16 0933 02/16/16 1511 02/16/16 1806  HGB 13.2  --   --  12.0  --   --   --   HCT 37.9  --   --  35.7*  --   --   --   PLT 319  --   --  291  --   --   --   HEPARINUNFRC  --   --   --  0.11* 0.13*  --  0.37  CREATININE  --   --  0.56 0.51  --   --   --   TROPONINI  --  <0.03  --   --  <0.03 <0.03  --     Estimated Creatinine Clearance: 76.4 mL/min (by C-G formula based on SCr of 0.8 mg/dL).   Assessment: 50 yo female s/p cardiac cath with multi-vessel CAD with CABG planned for 9/6. Heparin increased to 1050 units/hr and heparin level is at goal   Goal of Therapy:  Heparin level 0.3-0.7 units/ml Monitor platelets by anticoagulation protocol: Yes   Plan:  -No heparin changes needed -Daily CBC HL  Harland Germanndrew Rhonna Holster, Pharm D 02/16/2016 7:00 PM

## 2016-02-16 NOTE — Progress Notes (Signed)
  Echocardiogram 2D Echocardiogram has been performed.  Nolon RodBrown, Tony 02/16/2016, 1:02 PM

## 2016-02-16 NOTE — Progress Notes (Signed)
ANTICOAGULATION CONSULT NOTE - Follow Up Consult  Pharmacy Consult for Heparin  Indication: Awaiting CABG 9/6  Allergies  Allergen Reactions  . Aleve [Naproxen] Palpitations  . Crestor [Rosuvastatin] Other (See Comments)    Joint & muscle aches and pain  . Lipitor [Atorvastatin] Other (See Comments)    Joint & muscle aches and pain  . Pravachol [Pravastatin] Other (See Comments)    Joint & muscle aches and pain  . Zocor [Simvastatin] Other (See Comments)    Joint & muscle aches and pain  . Hydrocodone-Acetaminophen Nausea And Vomiting  . Oxycodone-Acetaminophen Nausea And Vomiting    Patient Measurements: Height: 5\' 2"  (157.5 cm) Weight: 148 lb 9.6 oz (67.4 kg) IBW/kg (Calculated) : 50.1  Vital Signs: Temp: 97.8 F (36.6 C) (09/02 0537) Temp Source: Oral (09/01 2312) BP: 166/90 (09/02 0537) Pulse Rate: 50 (09/02 0537)  Labs:  Recent Labs  02/15/16 1018 02/15/16 1042 02/15/16 1130 02/16/16 0505  HGB 13.2  --   --  12.0  HCT 37.9  --   --  35.7*  PLT 319  --   --  291  HEPARINUNFRC  --   --   --  0.11*  CREATININE  --   --  0.56 0.51  TROPONINI  --  <0.03  --   --     Estimated Creatinine Clearance: 77.4 mL/min (by C-G formula based on SCr of 0.8 mg/dL).   Assessment: Heparin while awaiting CABG on 9/6, heparin level was drawn only 3 hours after heparin infusion was started  Goal of Therapy:  Heparin level 0.3-0.7 units/ml Monitor platelets by anticoagulation protocol: Yes   Plan:  -Cont heparin at 800 units/hr  -Re-draw heparin level at 1000   Abran DukeLedford, Lesley Atkin 02/16/2016,6:07 AM

## 2016-02-17 ENCOUNTER — Other Ambulatory Visit: Payer: Self-pay | Admitting: Cardiology

## 2016-02-17 ENCOUNTER — Inpatient Hospital Stay (HOSPITAL_COMMUNITY): Payer: Medicare Other

## 2016-02-17 DIAGNOSIS — I200 Unstable angina: Secondary | ICD-10-CM

## 2016-02-17 DIAGNOSIS — Z01810 Encounter for preprocedural cardiovascular examination: Secondary | ICD-10-CM

## 2016-02-17 LAB — CBC
HEMATOCRIT: 36.4 % (ref 36.0–46.0)
HEMOGLOBIN: 12.1 g/dL (ref 12.0–15.0)
MCH: 31.3 pg (ref 26.0–34.0)
MCHC: 33.2 g/dL (ref 30.0–36.0)
MCV: 94.3 fL (ref 78.0–100.0)
Platelets: 265 10*3/uL (ref 150–400)
RBC: 3.86 MIL/uL — ABNORMAL LOW (ref 3.87–5.11)
RDW: 12.5 % (ref 11.5–15.5)
WBC: 11.5 10*3/uL — AB (ref 4.0–10.5)

## 2016-02-17 LAB — HEPARIN LEVEL (UNFRACTIONATED): Heparin Unfractionated: 0.51 IU/mL (ref 0.30–0.70)

## 2016-02-17 MED ORDER — DICLOFENAC SODIUM 1 % TD GEL
2.0000 g | Freq: Four times a day (QID) | TRANSDERMAL | Status: DC | PRN
  Administered 2016-02-17: 2 g via TOPICAL
  Filled 2016-02-17: qty 100

## 2016-02-17 NOTE — Progress Notes (Signed)
Pre-op Cardiac Surgery  Carotid Findings:  Study completed at [facility]  01/31/16. Right - Less that 40%. Left 40% to 59% however  elevated velocities mat be die to tortuosity  Upper Extremity Right Left  Brachial Pressures 157 Triphasic 150 Triphasic  Radial Waveforms Triphasic Triphasic  Ulnar Waveforms Triphasic Triphasic  Palmar Arch (Allen's Test) Normal Normal   Findings:  Doppler waveforms remained normal bilaterally with both radial and ulnar compressions    Lower  Extremity Right Left  Dorsalis Pedis    Anterior Tibial    Posterior Tibial    Ankle/Brachial Indices      Findings:  Study completed at [facility] 01/31/16. Right ABI indicated a mild occlusive arterial disease at rest. Left ABI indicated a moderate occlusive arterial disease at rest.

## 2016-02-17 NOTE — Progress Notes (Signed)
Report received via Tyron RussellYeni RN in patient's room using SBAR format, reviewed orders, VS, labs, meds and patient's general condition, assumed care of patient. Patient's right and left wrists are ecchymotic and edemadous but have fair pulses, are warm with good capillary refill, elevated them both on a pillow and put an ice pack to each side, will continue to monitor. Patient was medicated with Ativan po, at her request , for anxiety being in the hospital, will reinforce instructions and provide reassurance about her condition and her stay in the hospital.

## 2016-02-17 NOTE — Progress Notes (Signed)
ANTICOAGULATION CONSULT NOTE - Follow Up Consult  Pharmacy Consult for Heparin  Indication: Awaiting CABG 9/6  Allergies  Allergen Reactions  . Aleve [Naproxen] Palpitations  . Crestor [Rosuvastatin] Other (See Comments)    Joint & muscle aches and pain  . Lipitor [Atorvastatin] Other (See Comments)    Joint & muscle aches and pain  . Pravachol [Pravastatin] Other (See Comments)    Joint & muscle aches and pain  . Zocor [Simvastatin] Other (See Comments)    Joint & muscle aches and pain  . Hydrocodone-Acetaminophen Nausea And Vomiting  . Oxycodone-Acetaminophen Nausea And Vomiting    Patient Measurements: Height: 5\' 2"  (157.5 cm) Weight: 144 lb 8 oz (65.5 kg) IBW/kg (Calculated) : 50.1  Vital Signs: Temp: 97.8 F (36.6 C) (09/03 0734) Temp Source: Oral (09/03 0734) BP: 125/57 (09/03 0734) Pulse Rate: 45 (09/03 0734)  Labs:  Recent Labs  02/15/16 1018  02/15/16 1130  02/16/16 0505 02/16/16 0933 02/16/16 1511 02/16/16 1806 02/16/16 2030 02/17/16 0341  HGB 13.2  --   --   --  12.0  --   --   --   --  12.1  HCT 37.9  --   --   --  35.7*  --   --   --   --  36.4  PLT 319  --   --   --  291  --   --   --   --  265  HEPARINUNFRC  --   --   --   < > 0.11* 0.13*  --  0.37  --  0.51  CREATININE  --   --  0.56  --  0.51  --   --   --   --   --   TROPONINI  --   < >  --   --   --  <0.03 <0.03  --  <0.03  --   < > = values in this interval not displayed.  Estimated Creatinine Clearance: 76.4 mL/min (by C-G formula based on SCr of 0.8 mg/dL).   Assessment: - Post cardiac cath with multi-vessel CAD. Heparin restarted at at 2 am 9/1 at 800/hr. Initial heparin level drawn 3 hours early was subtherapeutic. 1000 re-check was still subtherapeutic at 0.13 on 9/2. Increased rate to 1050 units/hr with two therapeutic levels.  H/H stable. PLTC trending down a little, though wnl. No s/sx of bleeding noted. CABG planned for 9/6. Continue current heparin rate.   Goal of Therapy:   Heparin level 0.3-0.7 units/ml Monitor platelets by anticoagulation protocol: Yes   Plan:  Continue Heparin 1050 units/hr Check daily heparin levels and CBC's Continue to monitor H&H and platelets   Alfredo BachJoseph Arminger, BS, PharmD Clinical Pharmacy Resident [phone] (Pager) 02/17/2016 10:26 AM

## 2016-02-17 NOTE — Progress Notes (Signed)
DAILY PROGRESS NOTE  Subjective:  Musculoskeletal back pain overnight. BP improved today on nitro gtts. Echo yesterday shows EF 60-65% with normal wall motion and surprisingly normal diastolic function, there is aortic valve sclerosis with mild AI and normal LA size. For CABG with PVT on Wednesday.  Objective:  Temp:  [97.8 F (36.6 C)-98.2 F (36.8 C)] 97.8 F (36.6 C) (09/03 0734) Pulse Rate:  [44-64] 45 (09/03 0734) Resp:  [17-18] 17 (09/03 0734) BP: (90-179)/(49-100) 125/57 (09/03 0734) SpO2:  [95 %-100 %] 97 % (09/03 0734) Weight:  [144 lb 8 oz (65.5 kg)-144 lb 11.2 oz (65.6 kg)] 144 lb 8 oz (65.5 kg) (09/03 0510) Weight change: -3 lb 14.4 oz (-1.769 kg)  Intake/Output from previous day: 09/02 0701 - 09/03 0700 In: 513 [P.O.:120; I.V.:393] Out: 750 [Urine:750]  Intake/Output from this shift: No intake/output data recorded.  Medications: Current Facility-Administered Medications on File Prior to Encounter  Medication Dose Route Frequency Provider Last Rate Last Dose  . nitroGLYCERIN (NITROSTAT) SL tablet 0.4 mg  0.4 mg Sublingual Q5 min PRN Lendon Colonel, NP   0.4 mg at 02/15/16 5916   Current Outpatient Prescriptions on File Prior to Encounter  Medication Sig Dispense Refill  . carvedilol (COREG) 12.5 MG tablet TAKE 1 TABLET BY MOUTH TWICE A DAY 180 tablet 3  . isosorbide mononitrate (IMDUR) 60 MG 24 hr tablet TAKE 3 TABLETS BY MOUTH EVERY DAY 90 tablet 6  . nitroGLYCERIN (NITROSTAT) 0.4 MG SL tablet PLACE 1 TABLET UNDER TONGUE EVERY 5 MINUTES AS NEEDED. (Patient taking differently: PLACE 1 TABLET UNDER TONGUE EVERY 5 MINUTES AS NEEDED FOR CHEST PAIN) 25 tablet 3  . PLAVIX 75 MG tablet TAKE ONE TABLET BY MOUTH DAILY (Patient taking differently: TAKE ONE TABLET BY MOUTH DAILY AFTER SUPPER) 30 tablet 6    Physical Exam: General appearance: alert and no distress Neck: no carotid bruit and no JVD Lungs: clear to auscultation bilaterally Heart: regular rate and  rhythm, S1, S2 normal, no murmur, click, rub or gallop Abdomen: soft, non-tender; bowel sounds normal; no masses,  no organomegaly Extremities: extremities normal, atraumatic, no cyanosis or edema and mild bruising over the radial cath site Pulses: 2+ and symmetric Skin: Skin color, texture, turgor normal. No rashes or lesions Neurologic: Grossly normal Psych: Mildly anxious  Lab Results: Results for orders placed or performed during the hospital encounter of 02/15/16 (from the past 48 hour(s))  CBC with Differential/Platelet     Status: Abnormal   Collection Time: 02/15/16 10:18 AM  Result Value Ref Range   WBC 12.6 (H) 4.0 - 10.5 K/uL   RBC 4.10 3.87 - 5.11 MIL/uL   Hemoglobin 13.2 12.0 - 15.0 g/dL   HCT 37.9 36.0 - 46.0 %   MCV 92.4 78.0 - 100.0 fL   MCH 32.2 26.0 - 34.0 pg   MCHC 34.8 30.0 - 36.0 g/dL   RDW 12.2 11.5 - 15.5 %   Platelets 319 150 - 400 K/uL   Neutrophils Relative % 63 %   Neutro Abs 7.8 (H) 1.7 - 7.7 K/uL   Lymphocytes Relative 31 %   Lymphs Abs 3.9 0.7 - 4.0 K/uL   Monocytes Relative 5 %   Monocytes Absolute 0.6 0.1 - 1.0 K/uL   Eosinophils Relative 1 %   Eosinophils Absolute 0.2 0.0 - 0.7 K/uL   Basophils Relative 0 %   Basophils Absolute 0.0 0.0 - 0.1 K/uL  Troponin I     Status: None  Collection Time: 02/15/16 10:42 AM  Result Value Ref Range   Troponin I <0.03 <0.03 ng/mL  Basic metabolic panel     Status: Abnormal   Collection Time: 02/15/16 11:30 AM  Result Value Ref Range   Sodium 137 135 - 145 mmol/L   Potassium 3.5 3.5 - 5.1 mmol/L   Chloride 102 101 - 111 mmol/L   CO2 26 22 - 32 mmol/L   Glucose, Bld 135 (H) 65 - 99 mg/dL   BUN 7 6 - 20 mg/dL   Creatinine, Ser 0.56 0.44 - 1.00 mg/dL   Calcium 9.2 8.9 - 10.3 mg/dL   GFR calc non Af Amer >60 >60 mL/min   GFR calc Af Amer >60 >60 mL/min    Comment: (NOTE) The eGFR has been calculated using the CKD EPI equation. This calculation has not been validated in all clinical situations. eGFR's  persistently <60 mL/min signify possible Chronic Kidney Disease.    Anion gap 9 5 - 15  TSH     Status: None   Collection Time: 02/16/16  5:05 AM  Result Value Ref Range   TSH 4.034 0.350 - 4.500 uIU/mL  Platelet inhibition p2y12 (not at [facility])     Status: Abnormal   Collection Time: 02/16/16  5:05 AM  Result Value Ref Range   Platelet Function  P2Y12 175 (L) 194 - 418 PRU    Comment:        The literature has shown a direct correlation of PRU values over 230 with higher risks of thrombotic events.  Lower PRU values are associated with platelet inhibition.   Heparin level (unfractionated)     Status: Abnormal   Collection Time: 02/16/16  5:05 AM  Result Value Ref Range   Heparin Unfractionated 0.11 (L) 0.30 - 0.70 IU/mL    Comment:        IF HEPARIN RESULTS ARE BELOW EXPECTED VALUES, AND PATIENT DOSAGE HAS BEEN CONFIRMED, SUGGEST FOLLOW UP TESTING OF ANTITHROMBIN III LEVELS.   CBC     Status: Abnormal   Collection Time: 02/16/16  5:05 AM  Result Value Ref Range   WBC 9.4 4.0 - 10.5 K/uL   RBC 3.83 (L) 3.87 - 5.11 MIL/uL   Hemoglobin 12.0 12.0 - 15.0 g/dL   HCT 35.7 (L) 36.0 - 46.0 %   MCV 93.2 78.0 - 100.0 fL   MCH 31.3 26.0 - 34.0 pg   MCHC 33.6 30.0 - 36.0 g/dL   RDW 12.4 11.5 - 15.5 %   Platelets 291 150 - 400 K/uL  Basic metabolic panel     Status: Abnormal   Collection Time: 02/16/16  5:05 AM  Result Value Ref Range   Sodium 142 135 - 145 mmol/L   Potassium 3.3 (L) 3.5 - 5.1 mmol/L   Chloride 109 101 - 111 mmol/L   CO2 26 22 - 32 mmol/L   Glucose, Bld 121 (H) 65 - 99 mg/dL   BUN <5 (L) 6 - 20 mg/dL   Creatinine, Ser 0.51 0.44 - 1.00 mg/dL   Calcium 9.1 8.9 - 10.3 mg/dL   GFR calc non Af Amer >60 >60 mL/min   GFR calc Af Amer >60 >60 mL/min    Comment: (NOTE) The eGFR has been calculated using the CKD EPI equation. This calculation has not been validated in all clinical situations. eGFR's persistently <60 mL/min signify possible Chronic Kidney Disease.     Anion gap 7 5 - 15  Surgical pcr screen     Status:  None   Collection Time: 02/16/16  6:37 AM  Result Value Ref Range   MRSA, PCR NEGATIVE NEGATIVE   Staphylococcus aureus NEGATIVE NEGATIVE    Comment:        The Xpert SA Assay (FDA approved for NASAL specimens in patients over 21 years of age), is one component of a comprehensive surveillance program.  Test performance has been validated by [facility] for patients greater than or equal to 1 year old. It is not intended to diagnose infection nor to guide or monitor treatment.   Heparin level (unfractionated)     Status: Abnormal   Collection Time: 02/16/16  9:33 AM  Result Value Ref Range   Heparin Unfractionated 0.13 (L) 0.30 - 0.70 IU/mL    Comment:        IF HEPARIN RESULTS ARE BELOW EXPECTED VALUES, AND PATIENT DOSAGE HAS BEEN CONFIRMED, SUGGEST FOLLOW UP TESTING OF ANTITHROMBIN III LEVELS.   Troponin I     Status: None   Collection Time: 02/16/16  9:33 AM  Result Value Ref Range   Troponin I <0.03 <0.03 ng/mL  Troponin I     Status: None   Collection Time: 02/16/16  3:11 PM  Result Value Ref Range   Troponin I <0.03 <0.03 ng/mL  Heparin level (unfractionated)     Status: None   Collection Time: 02/16/16  6:06 PM  Result Value Ref Range   Heparin Unfractionated 0.37 0.30 - 0.70 IU/mL    Comment:        IF HEPARIN RESULTS ARE BELOW EXPECTED VALUES, AND PATIENT DOSAGE HAS BEEN CONFIRMED, SUGGEST FOLLOW UP TESTING OF ANTITHROMBIN III LEVELS.   Troponin I     Status: None   Collection Time: 02/16/16  8:30 PM  Result Value Ref Range   Troponin I <0.03 <0.03 ng/mL  CBC     Status: Abnormal   Collection Time: 02/17/16  3:41 AM  Result Value Ref Range   WBC 11.5 (H) 4.0 - 10.5 K/uL   RBC 3.86 (L) 3.87 - 5.11 MIL/uL   Hemoglobin 12.1 12.0 - 15.0 g/dL   HCT 36.4 36.0 - 46.0 %   MCV 94.3 78.0 - 100.0 fL   MCH 31.3 26.0 - 34.0 pg   MCHC 33.2 30.0 - 36.0 g/dL   RDW 12.5 11.5 - 15.5 %   Platelets 265 150 -  400 K/uL  Heparin level (unfractionated)     Status: None   Collection Time: 02/17/16  3:41 AM  Result Value Ref Range   Heparin Unfractionated 0.51 0.30 - 0.70 IU/mL    Comment:        IF HEPARIN RESULTS ARE BELOW EXPECTED VALUES, AND PATIENT DOSAGE HAS BEEN CONFIRMED, SUGGEST FOLLOW UP TESTING OF ANTITHROMBIN III LEVELS.     Imaging: Nm Myocar Multi W/spect W/wall Motion / Ef  Result Date: 02/15/2016  Blood pressure demonstrated a hypertensive response to exercise.  Downsloping ST segment depression ST segment depression of 3 mm was noted during stress in the II, III, aVF, V3, V4, V5 and V6 leads. Incomplete normalization in recovery  Pt transferred to [facility] for cardiac catheterization    Dg Chest Port 1 View  Result Date: 02/15/2016 CLINICAL DATA:  Chest pain EXAM: PORTABLE CHEST 1 VIEW COMPARISON:  December 28, 2006 FINDINGS: Lungs are clear. Heart size and pulmonary vascularity are normal. No adenopathy. There is a focal area of calcification in the aortic arch. No bone lesions. No pneumothorax. IMPRESSION: Small focus of aortic atherosclerosis.    No edema or consolidation. Electronically Signed   By: William  Woodruff III M.D.   On: 02/15/2016 10:42    Assessment:  1. Active Problems: 2.   TOBACCO ABUSE 3.   Coronary atherosclerosis of native coronary artery 4.   Essential hypertension, benign 5.   Mixed hyperlipidemia 6.   PVD (peripheral vascular disease) with claudication (HCC) 7.   Unstable angina (HCC) 8.   Coronary artery disease 9.   Plan:  1. Bp improved overnight on nitroglycerin gtts. Remains on IV heparin. Plan for CABG on Wednesday. Will provide Voltaren gel at her request for MSK back pain - continue to use heating pad as well.  Time Spent Directly with Patient:  15 minutes  Length of Stay:  LOS: 2 days    C. , MD, FACC Attending Cardiologist [facility]   C  02/17/2016, 7:53 AM    

## 2016-02-18 ENCOUNTER — Inpatient Hospital Stay (HOSPITAL_COMMUNITY): Payer: Medicare Other

## 2016-02-18 ENCOUNTER — Encounter (HOSPITAL_COMMUNITY): Payer: Self-pay | Admitting: Radiology

## 2016-02-18 DIAGNOSIS — I739 Peripheral vascular disease, unspecified: Secondary | ICD-10-CM

## 2016-02-18 LAB — CBC
HCT: 37 % (ref 36.0–46.0)
HEMOGLOBIN: 12.4 g/dL (ref 12.0–15.0)
MCH: 31.6 pg (ref 26.0–34.0)
MCHC: 33.5 g/dL (ref 30.0–36.0)
MCV: 94.4 fL (ref 78.0–100.0)
PLATELETS: 262 10*3/uL (ref 150–400)
RBC: 3.92 MIL/uL (ref 3.87–5.11)
RDW: 12.2 % (ref 11.5–15.5)
WBC: 9.7 10*3/uL (ref 4.0–10.5)

## 2016-02-18 LAB — HEPARIN LEVEL (UNFRACTIONATED): HEPARIN UNFRACTIONATED: 0.5 [IU]/mL (ref 0.30–0.70)

## 2016-02-18 MED ORDER — ENSURE ENLIVE PO LIQD
237.0000 mL | Freq: Two times a day (BID) | ORAL | Status: DC
  Administered 2016-02-18 – 2016-02-19 (×3): 237 mL via ORAL
  Filled 2016-02-18 (×4): qty 237

## 2016-02-18 MED ORDER — IOPAMIDOL (ISOVUE-300) INJECTION 61%
INTRAVENOUS | Status: AC
  Administered 2016-02-18: 75 mL
  Filled 2016-02-18: qty 75

## 2016-02-18 NOTE — Progress Notes (Signed)
Pt and family at bedside attempted to watch preparing for heart surgery video.  System currently down.  Information and instructions on how to start video left at pt bedside.  Will try to view again later.

## 2016-02-18 NOTE — Progress Notes (Signed)
ANTICOAGULATION CONSULT NOTE - Follow Up Consult  Pharmacy Consult for Heparin Indication: chest pain/ACS, awaiting CABG 9/6  Allergies  Allergen Reactions  . Aleve [Naproxen] Palpitations  . Crestor [Rosuvastatin] Other (See Comments)    Joint & muscle aches and pain  . Lipitor [Atorvastatin] Other (See Comments)    Joint & muscle aches and pain  . Pravachol [Pravastatin] Other (See Comments)    Joint & muscle aches and pain  . Zocor [Simvastatin] Other (See Comments)    Joint & muscle aches and pain  . Hydrocodone-Acetaminophen Nausea And Vomiting  . Oxycodone-Acetaminophen Nausea And Vomiting    Patient Measurements: Height: 5\' 2"  (157.5 cm) Weight: 144 lb 4.8 oz (65.5 kg) IBW/kg (Calculated) : 50.1  Vital Signs: Temp: 98.1 F (36.7 C) (09/04 0646) Temp Source: Oral (09/04 0646) BP: 103/50 (09/04 0646) Pulse Rate: 51 (09/04 0646)  Labs:  Recent Labs  02/15/16 1130  02/16/16 0505 02/16/16 0933 02/16/16 1511 02/16/16 1806 02/16/16 2030 02/17/16 0341 02/18/16 0233  HGB  --   --  12.0  --   --   --   --  12.1 12.4  HCT  --   --  35.7*  --   --   --   --  36.4 37.0  PLT  --   --  291  --   --   --   --  265 262  HEPARINUNFRC  --   < > 0.11* 0.13*  --  0.37  --  0.51 0.50  CREATININE 0.56  --  0.51  --   --   --   --   --   --   TROPONINI  --   --   --  <0.03 <0.03  --  <0.03  --   --   < > = values in this interval not displayed.  Estimated Creatinine Clearance: 76.4 mL/min (by C-G formula based on SCr of 0.8 mg/dL).   Medications:  Infusions:  . heparin 1,050 Units/hr (02/18/16 0712)  . nitroGLYCERIN 10 mcg/min (02/17/16 0500)    Assessment: Kelsey Hansen is a 50 yo female who was admitted on 02/15/2016 for chest pain s/p stress test failure. Patient has multi-vessel CAD with CABG planned for 02/20/16. Heparin was increased on 02/16/16 from 800 units/hr to 1050 units/hr, and patient has remained therapeutic since. Today's heparin level is 0.5, which is still therapeutic.  Hemoglobin and platelets remain stable. Although platelets have decreased from baseline of 319 to 262, this is a decrease of 18% and not a greater than 50% decrease, and not concerning at this time. No bleeding signs/symptoms are noted.   Goal of Therapy:  Heparin level 0.3-0.7 units/ml Monitor platelets by anticoagulation protocol: Yes   Plan:  Continue heparin 1050 units/hr Monitor daily heparin levels, CBC, and s/sx's of bleeding Continue to monitor slightly decreased platelets  Kelsey Hansen, PharmD PGY1 Pharmacy Resident [phone] (Pager) 02/18/2016 8:40 AM

## 2016-02-18 NOTE — Progress Notes (Signed)
DAILY PROGRESS NOTE  Subjective:  Musculoskeletal back pain improved overnight with voltaren. No chest pain. Not eating much.  Objective:  Temp:  [97.9 F (36.6 C)-98.4 F (36.9 C)] 98.1 F (36.7 C) (09/04 0646) Pulse Rate:  [41-70] 51 (09/04 0646) Resp:  [15-17] 17 (09/03 1900) BP: (103-152)/(50-79) 103/50 (09/04 0646) SpO2:  [98 %-99 %] 98 % (09/04 0646) Weight:  [144 lb 4.8 oz (65.5 kg)] 144 lb 4.8 oz (65.5 kg) (09/04 0646) Weight change: -6.4 oz (-0.181 kg)  Intake/Output from previous day: 09/03 0701 - 09/04 0700 In: 975 [P.O.:840; I.V.:135] Out: -   Intake/Output from this shift: Total I/O In: 240 [P.O.:240] Out: -   Medications: Current Facility-Administered Medications on File Prior to Encounter  Medication Dose Route Frequency Provider Last Rate Last Dose  . nitroGLYCERIN (NITROSTAT) SL tablet 0.4 mg  0.4 mg Sublingual Q5 min PRN Jodelle Gross, NP   0.4 mg at 02/15/16 1610   Current Outpatient Prescriptions on File Prior to Encounter  Medication Sig Dispense Refill  . carvedilol (COREG) 12.5 MG tablet TAKE 1 TABLET BY MOUTH TWICE A DAY 180 tablet 3  . isosorbide mononitrate (IMDUR) 60 MG 24 hr tablet TAKE 3 TABLETS BY MOUTH EVERY DAY 90 tablet 6  . nitroGLYCERIN (NITROSTAT) 0.4 MG SL tablet PLACE 1 TABLET UNDER TONGUE EVERY 5 MINUTES AS NEEDED. (Patient taking differently: PLACE 1 TABLET UNDER TONGUE EVERY 5 MINUTES AS NEEDED FOR CHEST PAIN) 25 tablet 3  . PLAVIX 75 MG tablet TAKE ONE TABLET BY MOUTH DAILY (Patient taking differently: TAKE ONE TABLET BY MOUTH DAILY AFTER SUPPER) 30 tablet 6    Physical Exam: General appearance: alert and no distress Neck: no carotid bruit and no JVD Lungs: clear to auscultation bilaterally Heart: regular rate and rhythm, S1, S2 normal, no murmur, click, rub or gallop Abdomen: soft, non-tender; bowel sounds normal; no masses,  no organomegaly Extremities: extremities normal, atraumatic, no cyanosis or edema and mild  bruising over the radial cath site Pulses: 2+ and symmetric Skin: Skin color, texture, turgor normal. No rashes or lesions Neurologic: Grossly normal Psych: Mildly anxious  Lab Results: Results for orders placed or performed during the hospital encounter of 02/15/16 (from the past 48 hour(s))  Heparin level (unfractionated)     Status: Abnormal   Collection Time: 02/16/16  9:33 AM  Result Value Ref Range   Heparin Unfractionated 0.13 (L) 0.30 - 0.70 IU/mL    Comment:        IF HEPARIN RESULTS ARE BELOW EXPECTED VALUES, AND PATIENT DOSAGE HAS BEEN CONFIRMED, SUGGEST FOLLOW UP TESTING OF ANTITHROMBIN III LEVELS.   Troponin I     Status: None   Collection Time: 02/16/16  9:33 AM  Result Value Ref Range   Troponin I <0.03 <0.03 ng/mL  Troponin I     Status: None   Collection Time: 02/16/16  3:11 PM  Result Value Ref Range   Troponin I <0.03 <0.03 ng/mL  Heparin level (unfractionated)     Status: None   Collection Time: 02/16/16  6:06 PM  Result Value Ref Range   Heparin Unfractionated 0.37 0.30 - 0.70 IU/mL    Comment:        IF HEPARIN RESULTS ARE BELOW EXPECTED VALUES, AND PATIENT DOSAGE HAS BEEN CONFIRMED, SUGGEST FOLLOW UP TESTING OF ANTITHROMBIN III LEVELS.   Troponin I     Status: None   Collection Time: 02/16/16  8:30 PM  Result Value Ref Range   Troponin I <  0.03 <0.03 ng/mL  CBC     Status: Abnormal   Collection Time: 02/17/16  3:41 AM  Result Value Ref Range   WBC 11.5 (H) 4.0 - 10.5 K/uL   RBC 3.86 (L) 3.87 - 5.11 MIL/uL   Hemoglobin 12.1 12.0 - 15.0 g/dL   HCT 16.136.4 09.636.0 - 04.546.0 %   MCV 94.3 78.0 - 100.0 fL   MCH 31.3 26.0 - 34.0 pg   MCHC 33.2 30.0 - 36.0 g/dL   RDW 40.912.5 81.111.5 - 91.415.5 %   Platelets 265 150 - 400 K/uL  Heparin level (unfractionated)     Status: None   Collection Time: 02/17/16  3:41 AM  Result Value Ref Range   Heparin Unfractionated 0.51 0.30 - 0.70 IU/mL    Comment:        IF HEPARIN RESULTS ARE BELOW EXPECTED VALUES, AND  PATIENT DOSAGE HAS BEEN CONFIRMED, SUGGEST FOLLOW UP TESTING OF ANTITHROMBIN III LEVELS.   CBC     Status: None   Collection Time: 02/18/16  2:33 AM  Result Value Ref Range   WBC 9.7 4.0 - 10.5 K/uL   RBC 3.92 3.87 - 5.11 MIL/uL   Hemoglobin 12.4 12.0 - 15.0 g/dL   HCT 78.237.0 95.636.0 - 21.346.0 %   MCV 94.4 78.0 - 100.0 fL   MCH 31.6 26.0 - 34.0 pg   MCHC 33.5 30.0 - 36.0 g/dL   RDW 08.612.2 57.811.5 - 46.915.5 %   Platelets 262 150 - 400 K/uL  Heparin level (unfractionated)     Status: None   Collection Time: 02/18/16  2:33 AM  Result Value Ref Range   Heparin Unfractionated 0.50 0.30 - 0.70 IU/mL    Comment:        IF HEPARIN RESULTS ARE BELOW EXPECTED VALUES, AND PATIENT DOSAGE HAS BEEN CONFIRMED, SUGGEST FOLLOW UP TESTING OF ANTITHROMBIN III LEVELS.     Imaging: No results found.  Assessment:  Active Problems:   TOBACCO ABUSE   Coronary atherosclerosis of native coronary artery   Essential hypertension, benign   Mixed hyperlipidemia   PVD (peripheral vascular disease) with claudication (HCC)   Unstable angina (HCC)   Coronary artery disease   Plan:  1. Remains on IV heparin. Plan for CABG on Wednesday. Voltaren gel helped MSK back pain - continue to use heating pad as well. Will add insure for extra protein to aid in recovery from CABG.  Time Spent Directly with Patient:  15 minutes  Length of Stay:  LOS: 3 days   Chrystie NoseKenneth C. Fitzhugh Vizcarrondo, MD, [facility] FoundationsFACC Attending Cardiologist [facility]  Chrystie NoseKenneth C Nyasia Baxley 02/18/2016, 7:54 AM

## 2016-02-19 ENCOUNTER — Inpatient Hospital Stay (HOSPITAL_COMMUNITY): Payer: Medicare Other

## 2016-02-19 ENCOUNTER — Encounter (HOSPITAL_COMMUNITY): Payer: Self-pay | Admitting: Interventional Cardiology

## 2016-02-19 LAB — URINALYSIS, ROUTINE W REFLEX MICROSCOPIC
Bilirubin Urine: NEGATIVE
Glucose, UA: NEGATIVE mg/dL
Hgb urine dipstick: NEGATIVE
Ketones, ur: NEGATIVE mg/dL
Leukocytes, UA: NEGATIVE
Nitrite: NEGATIVE
Protein, ur: NEGATIVE mg/dL
Specific Gravity, Urine: 1.014 (ref 1.005–1.030)
pH: 6.5 (ref 5.0–8.0)

## 2016-02-19 LAB — PULMONARY FUNCTION TEST
DL/VA % pred: 67 %
DL/VA: 3.1 ml/min/mmHg/L
DLCO unc % pred: 63 %
DLCO unc: 14.08 ml/min/mmHg
FEF 25-75 Post: 0.74 L/sec
FEF 25-75 Pre: 1.33 L/sec
FEF2575-%Change-Post: -44 %
FEF2575-%Pred-Post: 26 %
FEF2575-%Pred-Pre: 48 %
FEV1-%Change-Post: -20 %
FEV1-%Pred-Post: 54 %
FEV1-%Pred-Pre: 68 %
FEV1-Post: 1.48 L
FEV1-Pre: 1.86 L
FEV1FVC-%Change-Post: -22 %
FEV1FVC-%Pred-Pre: 87 %
FEV6-%Change-Post: -5 %
FEV6-%Pred-Post: 74 %
FEV6-%Pred-Pre: 79 %
FEV6-Post: 2.48 L
FEV6-Pre: 2.63 L
FEV6FVC-%Change-Post: 0 %
FEV6FVC-%Pred-Post: 101 %
FEV6FVC-%Pred-Pre: 102 %
FVC-%Change-Post: 3 %
FVC-%Pred-Post: 80 %
FVC-%Pred-Pre: 77 %
FVC-Post: 2.72 L
FVC-Pre: 2.63 L
Post FEV1/FVC ratio: 54 %
Post FEV6/FVC ratio: 99 %
Pre FEV1/FVC ratio: 71 %
Pre FEV6/FVC Ratio: 100 %
RV % pred: 109 %
RV: 1.83 L
TLC % pred: 100 %
TLC: 4.87 L

## 2016-02-19 LAB — COMPREHENSIVE METABOLIC PANEL
ALT: 16 U/L (ref 14–54)
AST: 22 U/L (ref 15–41)
Albumin: 4 g/dL (ref 3.5–5.0)
Alkaline Phosphatase: 72 U/L (ref 38–126)
Anion gap: 8 (ref 5–15)
BUN: 9 mg/dL (ref 6–20)
CO2: 31 mmol/L (ref 22–32)
Calcium: 10.2 mg/dL (ref 8.9–10.3)
Chloride: 100 mmol/L — ABNORMAL LOW (ref 101–111)
Creatinine, Ser: 0.64 mg/dL (ref 0.44–1.00)
GFR calc Af Amer: >60 mL/min (ref >60)
GFR calc non Af Amer: >60 mL/min (ref >60)
Glucose, Bld: 114 mg/dL — ABNORMAL HIGH (ref 65–99)
Potassium: 3.8 mmol/L (ref 3.5–5.1)
Sodium: 139 mmol/L (ref 135–145)
Total Bilirubin: 0.4 mg/dL (ref 0.3–1.2)
Total Protein: 7.4 g/dL (ref 6.5–8.1)

## 2016-02-19 LAB — HEPARIN LEVEL (UNFRACTIONATED): HEPARIN UNFRACTIONATED: 0.54 [IU]/mL (ref 0.30–0.70)

## 2016-02-19 LAB — BLOOD GAS, ARTERIAL
Acid-Base Excess: 3.5 mmol/L — ABNORMAL HIGH (ref 0.0–2.0)
Bicarbonate: 27.6 mmol/L (ref 20.0–28.0)
Drawn by: 257881
FIO2: 21
O2 Saturation: 93.8 %
Patient temperature: 98.6
pCO2 arterial: 43.2 mmHg (ref 32.0–48.0)
pH, Arterial: 7.422 (ref 7.350–7.450)
pO2, Arterial: 70.9 mmHg — ABNORMAL LOW (ref 83.0–108.0)

## 2016-02-19 LAB — PLATELET INHIBITION P2Y12: Platelet Function  P2Y12: 231 [PRU] (ref 194–418)

## 2016-02-19 LAB — CBC
HCT: 37.4 % (ref 36.0–46.0)
Hemoglobin: 12.4 g/dL (ref 12.0–15.0)
MCH: 31.3 pg (ref 26.0–34.0)
MCHC: 33.2 g/dL (ref 30.0–36.0)
MCV: 94.4 fL (ref 78.0–100.0)
PLATELETS: 261 10*3/uL (ref 150–400)
RBC: 3.96 MIL/uL (ref 3.87–5.11)
RDW: 12.1 % (ref 11.5–15.5)
WBC: 10.5 10*3/uL (ref 4.0–10.5)

## 2016-02-19 LAB — PROTIME-INR
INR: 0.96
Prothrombin Time: 12.7 seconds (ref 11.4–15.2)

## 2016-02-19 LAB — APTT: aPTT: 76 seconds — ABNORMAL HIGH (ref 24–36)

## 2016-02-19 LAB — PREPARE RBC (CROSSMATCH)

## 2016-02-19 MED ORDER — EPINEPHRINE HCL 1 MG/ML IJ SOLN
0.0000 ug/min | INTRAVENOUS | Status: DC
  Filled 2016-02-19: qty 4

## 2016-02-19 MED ORDER — SODIUM CHLORIDE 0.9 % IV SOLN
INTRAVENOUS | Status: DC
  Filled 2016-02-19: qty 40

## 2016-02-19 MED ORDER — SODIUM CHLORIDE 0.9 % IV SOLN
INTRAVENOUS | Status: DC
  Filled 2016-02-19: qty 2.5

## 2016-02-19 MED ORDER — DEXMEDETOMIDINE HCL IN NACL 400 MCG/100ML IV SOLN
0.1000 ug/kg/h | INTRAVENOUS | Status: DC
  Filled 2016-02-19: qty 100

## 2016-02-19 MED ORDER — DIAZEPAM 5 MG PO TABS
5.0000 mg | ORAL_TABLET | Freq: Once | ORAL | Status: AC
  Administered 2016-02-20: 5 mg via ORAL
  Filled 2016-02-19: qty 1

## 2016-02-19 MED ORDER — MAGNESIUM SULFATE 50 % IJ SOLN
40.0000 meq | INTRAMUSCULAR | Status: DC
  Filled 2016-02-19: qty 10

## 2016-02-19 MED ORDER — CEFUROXIME SODIUM 750 MG IJ SOLR
750.0000 mg | INTRAMUSCULAR | Status: DC
  Filled 2016-02-19: qty 750

## 2016-02-19 MED ORDER — CHLORHEXIDINE GLUCONATE 4 % EX LIQD
60.0000 mL | Freq: Once | CUTANEOUS | Status: AC
  Administered 2016-02-20: 4 via TOPICAL
  Filled 2016-02-19: qty 15

## 2016-02-19 MED ORDER — POTASSIUM CHLORIDE 2 MEQ/ML IV SOLN
80.0000 meq | INTRAVENOUS | Status: DC
  Filled 2016-02-19: qty 40

## 2016-02-19 MED ORDER — CHLORHEXIDINE GLUCONATE 4 % EX LIQD
60.0000 mL | Freq: Once | CUTANEOUS | Status: AC
  Administered 2016-02-19: 4 via TOPICAL
  Filled 2016-02-19: qty 60

## 2016-02-19 MED ORDER — HEPARIN SODIUM (PORCINE) 1000 UNIT/ML IJ SOLN
INTRAMUSCULAR | Status: DC
  Filled 2016-02-19: qty 30

## 2016-02-19 MED ORDER — CHLORHEXIDINE GLUCONATE 0.12 % MT SOLN
15.0000 mL | Freq: Once | OROMUCOSAL | Status: AC
  Administered 2016-02-20: 15 mL via OROMUCOSAL
  Filled 2016-02-19: qty 15

## 2016-02-19 MED ORDER — PAPAVERINE HCL 30 MG/ML IJ SOLN
INTRAMUSCULAR | Status: AC
  Administered 2016-02-20: 500 mL
  Filled 2016-02-19: qty 2.5

## 2016-02-19 MED ORDER — ALBUTEROL SULFATE (2.5 MG/3ML) 0.083% IN NEBU
2.5000 mg | INHALATION_SOLUTION | Freq: Once | RESPIRATORY_TRACT | Status: AC
  Administered 2016-02-19: 2.5 mg via RESPIRATORY_TRACT

## 2016-02-19 MED ORDER — METOPROLOL TARTRATE 12.5 MG HALF TABLET
12.5000 mg | ORAL_TABLET | Freq: Once | ORAL | Status: AC
  Administered 2016-02-20: 12.5 mg via ORAL
  Filled 2016-02-19: qty 1

## 2016-02-19 MED ORDER — PHENYLEPHRINE HCL 10 MG/ML IJ SOLN
30.0000 ug/min | INTRAVENOUS | Status: DC
  Filled 2016-02-19: qty 2

## 2016-02-19 MED ORDER — SORBITOL 70 % SOLN
60.0000 mL | Freq: Once | Status: AC
  Administered 2016-02-19: 60 mL via ORAL
  Filled 2016-02-19: qty 60

## 2016-02-19 MED ORDER — VANCOMYCIN HCL 10 G IV SOLR
1250.0000 mg | INTRAVENOUS | Status: DC
  Filled 2016-02-19: qty 1250

## 2016-02-19 MED ORDER — TEMAZEPAM 15 MG PO CAPS: 15.0000 mg | ORAL_CAPSULE | Freq: Once | ORAL | Status: DC | PRN

## 2016-02-19 MED ORDER — NITROGLYCERIN IN D5W 200-5 MCG/ML-% IV SOLN
2.0000 ug/min | INTRAVENOUS | Status: DC
  Filled 2016-02-19: qty 250

## 2016-02-19 MED ORDER — DOPAMINE-DEXTROSE 3.2-5 MG/ML-% IV SOLN
0.0000 ug/kg/min | INTRAVENOUS | Status: DC
  Filled 2016-02-19: qty 250

## 2016-02-19 MED ORDER — BISACODYL 5 MG PO TBEC
5.0000 mg | DELAYED_RELEASE_TABLET | Freq: Once | ORAL | Status: AC
  Administered 2016-02-19: 5 mg via ORAL
  Filled 2016-02-19: qty 1

## 2016-02-19 MED ORDER — DEXTROSE 5 % IV SOLN
1.5000 g | INTRAVENOUS | Status: AC
  Administered 2016-02-20: 1.5 g via INTRAVENOUS
  Administered 2016-02-20: .75 g via INTRAVENOUS
  Filled 2016-02-19: qty 1.5

## 2016-02-19 NOTE — Care Management Important Message (Signed)
Important Message  Patient Details  Name: Kelsey Hansen MRN: [id number] Date of Birth: [birth year]   Medicare Important Message Given:  Yes    Kyla BalzarineShealy, Alexzavier Girardin Abena 02/19/2016, 5:03 PM

## 2016-02-19 NOTE — Progress Notes (Signed)
Pastoral visit for patient attempted, it was reported by the Nursing that the patient is in a medical procedure at this time. Chaplain will follow up at a later time for spiritual care support. Chaplain Janell QuietAudrey Dathan Attia [phone]

## 2016-02-19 NOTE — Progress Notes (Signed)
ANTICOAGULATION CONSULT NOTE - Follow Up Consult  Pharmacy Consult for Heparin Indication: chest pain/ACS, awaiting CABG 9/6  Allergies  Allergen Reactions  . Aleve [Naproxen] Palpitations  . Crestor [Rosuvastatin] Other (See Comments)    Joint & muscle aches and pain  . Lipitor [Atorvastatin] Other (See Comments)    Joint & muscle aches and pain  . Pravachol [Pravastatin] Other (See Comments)    Joint & muscle aches and pain  . Zocor [Simvastatin] Other (See Comments)    Joint & muscle aches and pain  . Hydrocodone-Acetaminophen Nausea And Vomiting  . Oxycodone-Acetaminophen Nausea And Vomiting    Patient Measurements: Height: 5\' 2"  (157.5 cm) Weight: 144 lb 6.4 oz (65.5 kg) IBW/kg (Calculated) : 50.1  Vital Signs: Temp: 98 F (36.7 C) (09/05 0736) Temp Source: Oral (09/05 0736) BP: 122/61 (09/05 0736) Pulse Rate: 79 (09/05 0736)  Labs:  Recent Labs  02/16/16 0933 02/16/16 1511  02/16/16 2030  02/17/16 0341 02/18/16 0233 02/19/16 0434 02/19/16 0435  HGB  --   --   --   --   < > 12.1 12.4 12.4  --   HCT  --   --   --   --   --  36.4 37.0 37.4  --   PLT  --   --   --   --   --  265 262 261  --   HEPARINUNFRC 0.13*  --   < >  --   --  0.51 0.50  --  0.54  TROPONINI <0.03 <0.03  --  <0.03  --   --   --   --   --   < > = values in this interval not displayed.  Estimated Creatinine Clearance: 76.4 mL/min (by C-G formula based on SCr of 0.8 mg/dL).   Medications:  Infusions:  . heparin 1,050 Units/hr (02/19/16 0600)  . nitroGLYCERIN 10 mcg/min (02/19/16 0600)    Assessment: Kelsey Hansen is a 50 yo female who was admitted on 02/15/2016 for chest pain s/p stress test failure. Patient has multi-vessel CAD with CABG planned for 02/20/16.  -Heparin level is 0.54 and at goal   Goal of Therapy:  Heparin level 0.3-0.7 units/ml Monitor platelets by anticoagulation protocol: Yes   Plan:  Continue heparin 1050 units/hr Monitor daily heparin levels, CBC  Harland GermanAndrew Tanith Dagostino, Pharm D  02/19/2016 8:18 AM

## 2016-02-19 NOTE — Progress Notes (Signed)
[phone] Gave pt [facility] booklet and care guide. Put on pre op video when I left for pt and husband to view. She does not have an IS yet but did explain how important it will be after surgery. Discussed importance of mobility and demonstrated to pt how to get up and down without use of arms. Discussed importance of smoking cessation after surgery. Pt stated she is quitting cold [place] as she has done before. She did not want fake cigarette but her husband did. He wants to quit smoking. Gave him smoking cessation handout. Discussed needing someone with pt 24/7 first week home. This will be a problem for husband as he works and is home late evening to early morning. Will need to see case manager after surgery. Luetta NuttingCharlene Kylina Vultaggio RN BSN 02/19/2016 2:07 PM

## 2016-02-19 NOTE — Progress Notes (Signed)
4 Days Post-Op Procedure(s) (LRB): Left Heart Cath and Coronary Angiography (N/A) Subjective: Severe three-vessel CAD with unstable angina Patient stable on heparin for Plavix washout Platelet function now adequate by P2 Y 12 assay Preoperative CT scan of chest shows 5 mm left upper lobe nodule Plan multivessel CABG in a.m. Procedure benefits and risks discussed with patient Objective: Vital signs in last 24 hours: Temp:  [98 F (36.7 C)-98.3 F (36.8 C)] 98.1 F (36.7 C) (09/05 1634) Pulse Rate:  [54-81] 81 (09/05 1634) Cardiac Rhythm: Normal sinus rhythm (09/05 0750) Resp:  [14-16] 16 (09/05 1634) BP: (118-136)/(52-64) 120/62 (09/05 1634) SpO2:  [95 %-98 %] 97 % (09/05 1634) Weight:  [144 lb 6.4 oz (65.5 kg)] 144 lb 6.4 oz (65.5 kg) (09/05 0235)  Hemodynamic parameters for last 24 hours:   Stable Intake/Output from previous day: 09/04 0701 - 09/05 0700 In: 1453.5 [P.O.:960; I.V.:493.5] Out: 2725 [Urine:2725] Intake/Output this shift: Total I/O In: 451.5 [P.O.:240; I.V.:211.5] Out: -        Exam    General- alert and comfortable   Lungs- clear without rales, wheezes   Cor- regular rate and rhythm, no murmur , gallop   Abdomen- soft, non-tender   Extremities - warm, non-tender, minimal edema. Ecchymoses in right forearm from cath   Neuro- oriented, appropriate, no focal weakness   Lab Results:  Recent Labs  02/18/16 0233 02/19/16 0434  WBC 9.7 10.5  HGB 12.4 12.4  HCT 37.0 37.4  PLT 262 261   BMET: No results for input(s): NA, K, CL, CO2, GLUCOSE, BUN, CREATININE, CALCIUM in the last 72 hours.  PT/INR: No results for input(s): LABPROT, INR in the last 72 hours. ABG No results found for: PHART, HCO3, TCO2, ACIDBASEDEF, O2SAT CBG (last 3)  No results for input(s): GLUCAP in the last 72 hours.  Assessment/Plan: S/P Procedure(s) (LRB): Left Heart Cath and Coronary Angiography (N/A) CABG in a.m. Preoperative echocardiogram normal  LOS: 4 days    Kathlee Nationseter  Van Trigt III 02/19/2016

## 2016-02-19 NOTE — Progress Notes (Signed)
     SUBJECTIVE: No chest pain or dyspnea  Tele: sinus  BP 118/60   Pulse 80   Temp 98.3 F (36.8 C) (Oral)   Resp 14   Ht 5\' 2"  (1.575 m)   Wt 144 lb 6.4 oz (65.5 kg)   SpO2 98%   BMI 26.41 kg/m   Intake/Output Summary (Last 24 hours) at 02/19/16 1402 Last data filed at 02/19/16 1220  Gross per 24 hour  Intake             1049 ml  Output             2725 ml  Net            -1676 ml    PHYSICAL EXAM General: Well developed, well nourished, in no acute distress. Alert and oriented x 3.  Psych:  Good affect, responds appropriately Neck: No JVD. No masses noted.  Lungs: Clear bilaterally with no wheezes or rhonci noted.  Heart: RRR with no murmurs noted. Abdomen: Bowel sounds are present. Soft, non-tender.  Extremities: No lower extremity edema.   LABS: CBC:  Recent Labs  02/18/16 0233 02/19/16 0434  WBC 9.7 10.5  HGB 12.4 12.4  HCT 37.0 37.4  MCV 94.4 94.4  PLT 262 261   Cardiac Enzymes:  Recent Labs  02/16/16 1511 02/16/16 2030  TROPONINI <0.03 <0.03   Current Meds: . [START ON 02/20/2016] aminocaproic acid (AMICAR) for OHS   Intravenous To OR  . aspirin EC  81 mg Oral Daily  . bisacodyl  5 mg Oral Once  . carvedilol  12.5 mg Oral BID  . [START ON 02/20/2016] cefUROXime (ZINACEF)  IV  1.5 g Intravenous To OR  . [START ON 02/20/2016] cefUROXime (ZINACEF)  IV  750 mg Intravenous To OR  . chlorhexidine  60 mL Topical Once   And  . [START ON 02/20/2016] chlorhexidine  60 mL Topical Once  . [START ON 02/20/2016] chlorhexidine  15 mL Mouth/Throat Once  . [START ON 02/20/2016] dexmedetomidine  0.1-0.7 mcg/kg/hr Intravenous To OR  . [START ON 02/20/2016] diazepam  5 mg Oral Once  . [START ON 02/20/2016] DOPamine  0-10 mcg/kg/min Intravenous To OR  . [START ON 02/20/2016] epinephrine  0-10 mcg/min Intravenous To OR  . feeding supplement (ENSURE ENLIVE)  237 mL Oral BID BM  . [START ON 02/20/2016] heparin-papaverine-plasmalyte irrigation   Irrigation To OR  . [START ON  02/20/2016] heparin 30,000 units/NS 1000 mL solution for CELLSAVER   Other To OR  . [START ON 02/20/2016] insulin (NOVOLIN-R) infusion   Intravenous To OR  . [START ON 02/20/2016] magnesium sulfate  40 mEq Other To OR  . [START ON 02/20/2016] metoprolol tartrate  12.5 mg Oral Once  . [START ON 02/20/2016] nitroGLYCERIN  2-200 mcg/min Intravenous To OR  . [START ON 02/20/2016] phenylephrine (NEO-SYNEPHRINE) Adult infusion  30-200 mcg/min Intravenous To OR  . [START ON 02/20/2016] potassium chloride  80 mEq Other To OR  . sodium chloride flush  3 mL Intravenous Q12H  . [START ON 02/20/2016] vancomycin  1,250 mg Intravenous To OR     ASSESSMENT AND PLAN:  1. CAD with unstable angina: Pt with severe triple vessel CAD by cath 02/15/16. Plans for CABG tomorrow. Currently on ASA and beta blocker, heparin drip. No chest pain this am.  NPO at midnight.   Kelsey Hansen  9/5/20172:02 PM

## 2016-02-20 ENCOUNTER — Inpatient Hospital Stay (HOSPITAL_COMMUNITY)
Admission: EM | Disposition: A | Discharge status: Home Health | Payer: Self-pay | Source: Home / Self Care | Attending: Cardiothoracic Surgery

## 2016-02-20 ENCOUNTER — Inpatient Hospital Stay (HOSPITAL_COMMUNITY): Payer: Medicare Other | Admitting: Anesthesiology

## 2016-02-20 ENCOUNTER — Inpatient Hospital Stay (HOSPITAL_COMMUNITY): Payer: Medicare Other

## 2016-02-20 DIAGNOSIS — Z951 Presence of aortocoronary bypass graft: Secondary | ICD-10-CM

## 2016-02-20 HISTORY — PX: CORONARY ARTERY BYPASS GRAFT: SHX141

## 2016-02-20 HISTORY — PX: TEE WITHOUT CARDIOVERSION: SHX5443

## 2016-02-20 LAB — POCT I-STAT 4, (NA,K, GLUC, HGB,HCT)
Glucose, Bld: 125 mg/dL — ABNORMAL HIGH (ref 65–99)
HCT: 30 % — ABNORMAL LOW (ref 36.0–46.0)
Hemoglobin: 10.2 g/dL — ABNORMAL LOW (ref 12.0–15.0)
POTASSIUM: 3.3 mmol/L — AB (ref 3.5–5.1)
Sodium: 143 mmol/L (ref 135–145)

## 2016-02-20 LAB — POCT I-STAT 3, ART BLOOD GAS (G3+)
Acid-Base Excess: 1 mmol/L (ref 0.0–2.0)
Acid-base deficit: 2 mmol/L (ref 0.0–2.0)
BICARBONATE: 26.5 mmol/L (ref 20.0–28.0)
BICARBONATE: 25.9 mmol/L (ref 20.0–28.0)
Bicarbonate: 24.9 mmol/L (ref 20.0–28.0)
O2 Saturation: 96 %
O2 Saturation: 97 %
O2 Saturation: 96 %
PCO2 ART: 44.4 mmHg (ref 32.0–48.0)
PCO2 ART: 49.3 mmHg — AB (ref 32.0–48.0)
PH ART: 7.383 (ref 7.350–7.450)
PH ART: 7.336 — AB (ref 7.350–7.450)
PH ART: 7.309 — AB (ref 7.350–7.450)
PO2 ART: 81 mmHg — AB (ref 83.0–108.0)
PO2 ART: 91 mmHg (ref 83.0–108.0)
PO2 ART: 90 mmHg (ref 83.0–108.0)
Patient temperature: 36.8
Patient temperature: 36.6
TCO2: 28 mmol/L (ref 0–100)
TCO2: 27 mmol/L (ref 0–100)
TCO2: 26 mmol/L (ref 0–100)
pCO2 arterial: 48.3 mmHg — ABNORMAL HIGH (ref 32.0–48.0)

## 2016-02-20 LAB — CBC
HCT: 30.5 % — ABNORMAL LOW (ref 36.0–46.0)
HCT: 26.7 % — ABNORMAL LOW (ref 36.0–46.0)
HEMATOCRIT: 38.4 % (ref 36.0–46.0)
Hemoglobin: 12.9 g/dL (ref 12.0–15.0)
Hemoglobin: 10.1 g/dL — ABNORMAL LOW (ref 12.0–15.0)
Hemoglobin: 8.8 g/dL — ABNORMAL LOW (ref 12.0–15.0)
MCH: 31.7 pg (ref 26.0–34.0)
MCH: 31.3 pg (ref 26.0–34.0)
MCH: 31.3 pg (ref 26.0–34.0)
MCHC: 33.6 g/dL (ref 30.0–36.0)
MCHC: 33.1 g/dL (ref 30.0–36.0)
MCHC: 33 g/dL (ref 30.0–36.0)
MCV: 94.3 fL (ref 78.0–100.0)
MCV: 94.4 fL (ref 78.0–100.0)
MCV: 95 fL (ref 78.0–100.0)
PLATELETS: 150 10*3/uL (ref 150–400)
Platelets: 277 10*3/uL (ref 150–400)
Platelets: 191 10*3/uL (ref 150–400)
RBC: 4.07 MIL/uL (ref 3.87–5.11)
RBC: 3.23 MIL/uL — ABNORMAL LOW (ref 3.87–5.11)
RBC: 2.81 MIL/uL — ABNORMAL LOW (ref 3.87–5.11)
RDW: 12.3 % (ref 11.5–15.5)
RDW: 12.2 % (ref 11.5–15.5)
RDW: 12.4 % (ref 11.5–15.5)
WBC: 10.3 10*3/uL (ref 4.0–10.5)
WBC: 12.9 10*3/uL — ABNORMAL HIGH (ref 4.0–10.5)
WBC: 21.4 10*3/uL — ABNORMAL HIGH (ref 4.0–10.5)

## 2016-02-20 LAB — POCT I-STAT, CHEM 8
BUN: 6 mg/dL (ref 6–20)
CREATININE: 0.5 mg/dL (ref 0.44–1.00)
Calcium, Ion: 1.32 mmol/L (ref 1.15–1.40)
Chloride: 108 mmol/L (ref 101–111)
Glucose, Bld: 127 mg/dL — ABNORMAL HIGH (ref 65–99)
HEMATOCRIT: 35 % — AB (ref 36.0–46.0)
Hemoglobin: 11.9 g/dL — ABNORMAL LOW (ref 12.0–15.0)
POTASSIUM: 3.9 mmol/L (ref 3.5–5.1)
Sodium: 140 mmol/L (ref 135–145)
TCO2: 26 mmol/L (ref 0–100)

## 2016-02-20 LAB — HEMOGLOBIN AND HEMATOCRIT, BLOOD
HCT: 25.8 % — ABNORMAL LOW (ref 36.0–46.0)
Hemoglobin: 8.6 g/dL — ABNORMAL LOW (ref 12.0–15.0)

## 2016-02-20 LAB — BASIC METABOLIC PANEL
Anion gap: 13 (ref 5–15)
BUN: 8 mg/dL (ref 6–20)
CO2: 28 mmol/L (ref 22–32)
Calcium: 8.9 mg/dL (ref 8.9–10.3)
Chloride: 97 mmol/L — ABNORMAL LOW (ref 101–111)
Creatinine, Ser: 0.58 mg/dL (ref 0.44–1.00)
GFR calc Af Amer: >60 mL/min (ref >60)
GFR calc non Af Amer: >60 mL/min (ref >60)
Glucose, Bld: 120 mg/dL — ABNORMAL HIGH (ref 65–99)
Potassium: 3.6 mmol/L (ref 3.5–5.1)
Sodium: 138 mmol/L (ref 135–145)

## 2016-02-20 LAB — CREATININE, SERUM
Creatinine, Ser: 0.6 mg/dL (ref 0.44–1.00)
GFR calc Af Amer: >60 mL/min (ref >60)
GFR calc non Af Amer: >60 mL/min (ref >60)

## 2016-02-20 LAB — APTT: aPTT: 34 seconds (ref 24–36)

## 2016-02-20 LAB — HEMOGLOBIN A1C
Hgb A1c MFr Bld: 5.9 % — ABNORMAL HIGH (ref 4.8–5.6)
Mean Plasma Glucose: 123 mg/dL

## 2016-02-20 LAB — GLUCOSE, CAPILLARY
GLUCOSE-CAPILLARY: 125 mg/dL — AB (ref 65–99)
GLUCOSE-CAPILLARY: 112 mg/dL — AB (ref 65–99)
Glucose-Capillary: 104 mg/dL — ABNORMAL HIGH (ref 65–99)
Glucose-Capillary: 134 mg/dL — ABNORMAL HIGH (ref 65–99)
Glucose-Capillary: 135 mg/dL — ABNORMAL HIGH (ref 65–99)
Glucose-Capillary: 125 mg/dL — ABNORMAL HIGH (ref 65–99)

## 2016-02-20 LAB — PLATELET COUNT: Platelets: 169 10*3/uL (ref 150–400)

## 2016-02-20 LAB — PROTIME-INR
INR: 1.45
PROTHROMBIN TIME: 17.8 s — AB (ref 11.4–15.2)

## 2016-02-20 LAB — HEPARIN LEVEL (UNFRACTIONATED): Heparin Unfractionated: 0.5 IU/mL (ref 0.30–0.70)

## 2016-02-20 LAB — MAGNESIUM: Magnesium: 2.7 mg/dL — ABNORMAL HIGH (ref 1.7–2.4)

## 2016-02-20 SURGERY — CORONARY ARTERY BYPASS GRAFTING (CABG)
Anesthesia: General | Site: Chest

## 2016-02-20 MED ORDER — OXYCODONE HCL 5 MG PO TABS
5.0000 mg | ORAL_TABLET | ORAL | Status: DC | PRN
  Administered 2016-02-23: 10 mg via ORAL
  Filled 2016-02-20: qty 2
  Filled 2016-02-20: qty 1
  Filled 2016-02-20 (×2): qty 2

## 2016-02-20 MED ORDER — MAGNESIUM SULFATE 4 GM/100ML IV SOLN
4.0000 g | Freq: Once | INTRAVENOUS | Status: AC
  Administered 2016-02-20: 4 g via INTRAVENOUS
  Filled 2016-02-20: qty 100

## 2016-02-20 MED ORDER — POTASSIUM CHLORIDE 10 MEQ/50ML IV SOLN
10.0000 meq | Freq: Once | INTRAVENOUS | Status: AC
  Administered 2016-02-20: 10 meq via INTRAVENOUS

## 2016-02-20 MED ORDER — THROMBIN 5000 UNITS EX SOLR
CUTANEOUS | Status: AC
  Filled 2016-02-20: qty 5000

## 2016-02-20 MED ORDER — MOMETASONE FURO-FORMOTEROL FUM 100-5 MCG/ACT IN AERO
2.0000 | INHALATION_SPRAY | Freq: Two times a day (BID) | RESPIRATORY_TRACT | Status: DC
  Administered 2016-02-21 – 2016-02-29 (×16): 2 via RESPIRATORY_TRACT
  Filled 2016-02-20 (×4): qty 8.8

## 2016-02-20 MED ORDER — SODIUM CHLORIDE 0.45 % IV SOLN
INTRAVENOUS | Status: DC | PRN
  Administered 2016-02-20: 14:00:00 via INTRAVENOUS

## 2016-02-20 MED ORDER — PROPOFOL 10 MG/ML IV BOLUS
INTRAVENOUS | Status: AC
  Filled 2016-02-20: qty 20

## 2016-02-20 MED ORDER — LEVALBUTEROL HCL 1.25 MG/0.5ML IN NEBU
1.2500 mg | INHALATION_SOLUTION | Freq: Four times a day (QID) | RESPIRATORY_TRACT | Status: DC
  Administered 2016-02-20 – 2016-02-21 (×4): 1.25 mg via RESPIRATORY_TRACT
  Filled 2016-02-20 (×3): qty 0.5

## 2016-02-20 MED ORDER — LACTATED RINGERS IV SOLN
INTRAVENOUS | Status: DC | PRN
  Administered 2016-02-20 (×2): via INTRAVENOUS

## 2016-02-20 MED ORDER — PHENYLEPHRINE HCL 10 MG/ML IJ SOLN
0.0000 ug/min | INTRAVENOUS | Status: DC
  Filled 2016-02-20: qty 2

## 2016-02-20 MED ORDER — PROTAMINE SULFATE 10 MG/ML IV SOLN
INTRAVENOUS | Status: DC | PRN
  Administered 2016-02-20: 50 mg via INTRAVENOUS
  Administered 2016-02-20: 70 mg via INTRAVENOUS
  Administered 2016-02-20 (×2): 20 mg via INTRAVENOUS
  Administered 2016-02-20: 10 mg via INTRAVENOUS
  Administered 2016-02-20: 20 mg via INTRAVENOUS
  Administered 2016-02-20: 10 mg via INTRAVENOUS
  Administered 2016-02-20 (×2): 50 mg via INTRAVENOUS

## 2016-02-20 MED ORDER — POTASSIUM CHLORIDE 10 MEQ/50ML IV SOLN
10.0000 meq | INTRAVENOUS | Status: AC
  Administered 2016-02-20 (×3): 10 meq via INTRAVENOUS

## 2016-02-20 MED ORDER — SODIUM CHLORIDE 0.9 % IJ SOLN
OROMUCOSAL | Status: DC | PRN
  Administered 2016-02-20 (×3): 4 mL via TOPICAL

## 2016-02-20 MED ORDER — VANCOMYCIN HCL 1000 MG IV SOLR
INTRAVENOUS | Status: DC | PRN
  Administered 2016-02-20: 1250 mg via INTRAVENOUS

## 2016-02-20 MED ORDER — ALBUMIN HUMAN 5 % IV SOLN
INTRAVENOUS | Status: DC | PRN
  Administered 2016-02-20: 13:00:00 via INTRAVENOUS

## 2016-02-20 MED ORDER — DEXTROSE 5 % IV SOLN
1.5000 g | Freq: Two times a day (BID) | INTRAVENOUS | Status: AC
  Administered 2016-02-21 – 2016-02-22 (×4): 1.5 g via INTRAVENOUS
  Filled 2016-02-20 (×4): qty 1.5

## 2016-02-20 MED ORDER — ROCURONIUM BROMIDE 100 MG/10ML IV SOLN
INTRAVENOUS | Status: DC | PRN
  Administered 2016-02-20: 50 mg via INTRAVENOUS
  Administered 2016-02-20: 20 mg via INTRAVENOUS
  Administered 2016-02-20: 100 mg via INTRAVENOUS
  Administered 2016-02-20 (×2): 50 mg via INTRAVENOUS

## 2016-02-20 MED ORDER — MIDAZOLAM HCL 10 MG/2ML IJ SOLN
INTRAMUSCULAR | Status: AC
  Filled 2016-02-20: qty 2

## 2016-02-20 MED ORDER — SODIUM CHLORIDE 0.9% FLUSH
3.0000 mL | Freq: Two times a day (BID) | INTRAVENOUS | Status: DC
  Administered 2016-02-21 – 2016-02-28 (×9): 3 mL via INTRAVENOUS

## 2016-02-20 MED ORDER — TRAMADOL HCL 50 MG PO TABS
50.0000 mg | ORAL_TABLET | ORAL | Status: DC | PRN
  Administered 2016-02-21 – 2016-02-29 (×13): 100 mg via ORAL
  Filled 2016-02-20 (×14): qty 2

## 2016-02-20 MED ORDER — PHENYLEPHRINE HCL 10 MG/ML IJ SOLN
INTRAVENOUS | Status: DC | PRN
  Administered 2016-02-20: 10 ug/min via INTRAVENOUS

## 2016-02-20 MED ORDER — MIDAZOLAM HCL 2 MG/2ML IJ SOLN: 2.0000 mg | INTRAMUSCULAR | Status: DC | PRN

## 2016-02-20 MED ORDER — FENTANYL CITRATE (PF) 100 MCG/2ML IJ SOLN
50.0000 ug | INTRAMUSCULAR | Status: DC | PRN
  Administered 2016-02-20: 50 ug via INTRAVENOUS
  Administered 2016-02-21 (×4): 100 ug via INTRAVENOUS
  Administered 2016-02-21 (×2): 50 ug via INTRAVENOUS
  Filled 2016-02-20 (×7): qty 2

## 2016-02-20 MED ORDER — CHLORHEXIDINE GLUCONATE 0.12 % MT SOLN
15.0000 mL | Freq: Two times a day (BID) | OROMUCOSAL | Status: DC
  Administered 2016-02-20 – 2016-02-28 (×10): 15 mL via OROMUCOSAL
  Filled 2016-02-20 (×11): qty 15

## 2016-02-20 MED ORDER — ONDANSETRON HCL 4 MG/2ML IJ SOLN
4.0000 mg | Freq: Four times a day (QID) | INTRAMUSCULAR | Status: DC | PRN
  Administered 2016-02-20 – 2016-02-25 (×3): 4 mg via INTRAVENOUS
  Filled 2016-02-20 (×3): qty 2

## 2016-02-20 MED ORDER — ASPIRIN EC 325 MG PO TBEC
325.0000 mg | DELAYED_RELEASE_TABLET | Freq: Every day | ORAL | Status: DC
  Administered 2016-02-21 – 2016-03-01 (×10): 325 mg via ORAL
  Filled 2016-02-20 (×10): qty 1

## 2016-02-20 MED ORDER — METOPROLOL TARTRATE 25 MG/10 ML ORAL SUSPENSION: 12.5000 mg | Freq: Two times a day (BID) | ORAL | Status: DC

## 2016-02-20 MED ORDER — ALBUMIN HUMAN 5 % IV SOLN
250.0000 mL | INTRAVENOUS | Status: AC | PRN
  Administered 2016-02-20 (×2): 250 mL via INTRAVENOUS

## 2016-02-20 MED ORDER — CHLORHEXIDINE GLUCONATE 4 % EX LIQD
CUTANEOUS | Status: AC
  Administered 2016-02-20: 4 via TOPICAL
  Filled 2016-02-20: qty 15

## 2016-02-20 MED ORDER — NITROGLYCERIN 0.2 MG/ML ON CALL CATH LAB
INTRAVENOUS | Status: DC | PRN
  Administered 2016-02-20 (×2): 40 ug via INTRAVENOUS

## 2016-02-20 MED ORDER — SODIUM CHLORIDE 0.9 % IV SOLN
0.5000 g/h | Freq: Once | INTRAVENOUS | Status: DC
  Filled 2016-02-20: qty 20

## 2016-02-20 MED ORDER — LACTATED RINGERS IV SOLN: INTRAVENOUS | Status: DC

## 2016-02-20 MED ORDER — FENTANYL CITRATE (PF) 100 MCG/2ML IJ SOLN
INTRAMUSCULAR | Status: DC | PRN
  Administered 2016-02-20: 100 ug via INTRAVENOUS
  Administered 2016-02-20: 150 ug via INTRAVENOUS
  Administered 2016-02-20: 100 ug via INTRAVENOUS
  Administered 2016-02-20: 150 ug via INTRAVENOUS
  Administered 2016-02-20: 300 ug via INTRAVENOUS
  Administered 2016-02-20: 50 ug via INTRAVENOUS
  Administered 2016-02-20: 150 ug via INTRAVENOUS

## 2016-02-20 MED ORDER — HEMOSTATIC AGENTS (NO CHARGE) OPTIME
TOPICAL | Status: DC | PRN
  Administered 2016-02-20: 1 via TOPICAL

## 2016-02-20 MED ORDER — PANTOPRAZOLE SODIUM 40 MG PO TBEC
40.0000 mg | DELAYED_RELEASE_TABLET | Freq: Every day | ORAL | Status: DC
  Administered 2016-02-22 – 2016-03-01 (×9): 40 mg via ORAL
  Filled 2016-02-20 (×9): qty 1

## 2016-02-20 MED ORDER — MILRINONE LACTATE IN DEXTROSE 20-5 MG/100ML-% IV SOLN
0.2000 ug/kg/min | INTRAVENOUS | Status: DC
  Filled 2016-02-20: qty 100

## 2016-02-20 MED ORDER — MIDAZOLAM HCL 5 MG/5ML IJ SOLN
INTRAMUSCULAR | Status: DC | PRN
  Administered 2016-02-20: 2 mg via INTRAVENOUS
  Administered 2016-02-20 (×2): 3 mg via INTRAVENOUS
  Administered 2016-02-20 (×2): 2 mg via INTRAVENOUS

## 2016-02-20 MED ORDER — PROPOFOL 10 MG/ML IV BOLUS
INTRAVENOUS | Status: DC | PRN
  Administered 2016-02-20: 100 mg via INTRAVENOUS

## 2016-02-20 MED ORDER — CALCIUM CHLORIDE 10 % IV SOLN
INTRAVENOUS | Status: DC | PRN
  Administered 2016-02-20: 300 mg via INTRAVENOUS
  Administered 2016-02-20: 200 mg via INTRAVENOUS

## 2016-02-20 MED ORDER — LIDOCAINE HCL (CARDIAC) 20 MG/ML IV SOLN
INTRAVENOUS | Status: DC | PRN
  Administered 2016-02-20: 100 mg via INTRAVENOUS

## 2016-02-20 MED ORDER — ACETAMINOPHEN 500 MG PO TABS
1000.0000 mg | ORAL_TABLET | Freq: Four times a day (QID) | ORAL | Status: AC
  Administered 2016-02-20 – 2016-02-25 (×12): 1000 mg via ORAL
  Filled 2016-02-20 (×14): qty 2

## 2016-02-20 MED ORDER — ACETAMINOPHEN 650 MG RE SUPP
650.0000 mg | Freq: Once | RECTAL | Status: AC
  Administered 2016-02-20: 650 mg via RECTAL

## 2016-02-20 MED ORDER — LACTATED RINGERS IV SOLN: 500.0000 mL | Freq: Once | INTRAVENOUS | Status: DC | PRN

## 2016-02-20 MED ORDER — ASPIRIN 81 MG PO CHEW: 324.0000 mg | CHEWABLE_TABLET | Freq: Every day | ORAL | Status: DC

## 2016-02-20 MED ORDER — PHENYLEPHRINE HCL 10 MG/ML IJ SOLN
INTRAVENOUS | Status: DC | PRN
  Administered 2016-02-20: 25 ug/min via INTRAVENOUS

## 2016-02-20 MED ORDER — FAMOTIDINE IN NACL 20-0.9 MG/50ML-% IV SOLN
20.0000 mg | Freq: Two times a day (BID) | INTRAVENOUS | Status: DC
  Administered 2016-02-20: 20 mg via INTRAVENOUS

## 2016-02-20 MED ORDER — FENTANYL CITRATE (PF) 250 MCG/5ML IJ SOLN
INTRAMUSCULAR | Status: AC
  Filled 2016-02-20: qty 25

## 2016-02-20 MED ORDER — CHLORHEXIDINE GLUCONATE 0.12 % MT SOLN
15.0000 mL | OROMUCOSAL | Status: AC
  Administered 2016-02-20: 15 mL via OROMUCOSAL

## 2016-02-20 MED ORDER — SODIUM CHLORIDE 0.9 % IV SOLN
INTRAVENOUS | Status: DC
  Administered 2016-02-20: 15:00:00 via INTRAVENOUS

## 2016-02-20 MED ORDER — DOCUSATE SODIUM 100 MG PO CAPS
200.0000 mg | ORAL_CAPSULE | Freq: Every day | ORAL | Status: DC
  Administered 2016-02-21 – 2016-03-01 (×9): 200 mg via ORAL
  Filled 2016-02-20 (×10): qty 2

## 2016-02-20 MED ORDER — VANCOMYCIN HCL IN DEXTROSE 1-5 GM/200ML-% IV SOLN
1000.0000 mg | Freq: Once | INTRAVENOUS | Status: AC
  Administered 2016-02-20: 1000 mg via INTRAVENOUS
  Filled 2016-02-20: qty 200

## 2016-02-20 MED ORDER — BISACODYL 5 MG PO TBEC
10.0000 mg | DELAYED_RELEASE_TABLET | Freq: Every day | ORAL | Status: DC
  Administered 2016-02-21 – 2016-02-28 (×8): 10 mg via ORAL
  Filled 2016-02-20 (×9): qty 2

## 2016-02-20 MED ORDER — AMINOCAPROIC ACID 250 MG/ML IV SOLN
INTRAVENOUS | Status: DC | PRN
  Administered 2016-02-20: 5 g/h via INTRAVENOUS

## 2016-02-20 MED ORDER — HEPARIN SODIUM (PORCINE) 1000 UNIT/ML IJ SOLN
INTRAMUSCULAR | Status: DC | PRN
  Administered 2016-02-20: 3000 [IU] via INTRAVENOUS
  Administered 2016-02-20: 27000 [IU] via INTRAVENOUS

## 2016-02-20 MED ORDER — ACETAMINOPHEN 160 MG/5ML PO SOLN: 1000.0000 mg | Freq: Four times a day (QID) | ORAL | Status: DC

## 2016-02-20 MED ORDER — LEVALBUTEROL HCL 1.25 MG/0.5ML IN NEBU
INHALATION_SOLUTION | RESPIRATORY_TRACT | Status: AC
  Filled 2016-02-20: qty 0.5

## 2016-02-20 MED ORDER — METOPROLOL TARTRATE 5 MG/5ML IV SOLN: 2.5000 mg | INTRAVENOUS | Status: DC | PRN

## 2016-02-20 MED ORDER — SODIUM CHLORIDE 0.9 % IV SOLN: 250.0000 mL | INTRAVENOUS | Status: DC

## 2016-02-20 MED ORDER — PHENYLEPHRINE 40 MCG/ML (10ML) SYRINGE FOR IV PUSH (FOR BLOOD PRESSURE SUPPORT)
PREFILLED_SYRINGE | INTRAVENOUS | Status: AC
  Filled 2016-02-20: qty 10

## 2016-02-20 MED ORDER — METOPROLOL TARTRATE 12.5 MG HALF TABLET
12.5000 mg | ORAL_TABLET | Freq: Two times a day (BID) | ORAL | Status: DC
  Administered 2016-02-21 (×2): 12.5 mg via ORAL
  Filled 2016-02-20 (×2): qty 1

## 2016-02-20 MED ORDER — BISACODYL 10 MG RE SUPP: 10.0000 mg | Freq: Every day | RECTAL | Status: DC

## 2016-02-20 MED ORDER — EPHEDRINE SULFATE 50 MG/ML IJ SOLN
INTRAMUSCULAR | Status: DC | PRN
  Administered 2016-02-20: 10 mg via INTRAVENOUS

## 2016-02-20 MED ORDER — DEXMEDETOMIDINE HCL IN NACL 400 MCG/100ML IV SOLN
INTRAVENOUS | Status: DC | PRN
  Administered 2016-02-20: .5 ug/kg/h via INTRAVENOUS

## 2016-02-20 MED ORDER — SODIUM CHLORIDE 0.9 % IV SOLN
INTRAVENOUS | Status: DC
  Administered 2016-02-20: 19:00:00 via INTRAVENOUS
  Filled 2016-02-20: qty 2.5

## 2016-02-20 MED ORDER — NITROGLYCERIN IN D5W 200-5 MCG/ML-% IV SOLN
0.0000 ug/min | INTRAVENOUS | Status: DC
Start: 2016-02-20 — End: 2016-02-22

## 2016-02-20 MED ORDER — MIDAZOLAM HCL 2 MG/2ML IJ SOLN
INTRAMUSCULAR | Status: AC
  Filled 2016-02-20: qty 2

## 2016-02-20 MED ORDER — SODIUM CHLORIDE 0.9 % IV SOLN
INTRAVENOUS | Status: DC | PRN
  Administered 2016-02-20: 1.3 [IU]/h via INTRAVENOUS
  Administered 2016-02-20: 1 [IU]/h via INTRAVENOUS

## 2016-02-20 MED ORDER — DOPAMINE-DEXTROSE 3.2-5 MG/ML-% IV SOLN: 0.0000 ug/kg/min | INTRAVENOUS | Status: DC

## 2016-02-20 MED ORDER — ACETAMINOPHEN 160 MG/5ML PO SOLN: 650.0000 mg | Freq: Once | ORAL | Status: AC

## 2016-02-20 MED ORDER — ORAL CARE MOUTH RINSE: 15.0000 mL | OROMUCOSAL | Status: DC

## 2016-02-20 MED ORDER — 0.9 % SODIUM CHLORIDE (POUR BTL) OPTIME
TOPICAL | Status: DC | PRN
  Administered 2016-02-20: 1000 mL

## 2016-02-20 MED ORDER — SODIUM CHLORIDE 0.9% FLUSH: 3.0000 mL | INTRAVENOUS | Status: DC | PRN

## 2016-02-20 MED ORDER — DEXMEDETOMIDINE HCL IN NACL 200 MCG/50ML IV SOLN
0.0000 ug/kg/h | INTRAVENOUS | Status: DC
  Filled 2016-02-20: qty 50

## 2016-02-20 MED ORDER — INSULIN REGULAR BOLUS VIA INFUSION
0.0000 [IU] | Freq: Three times a day (TID) | INTRAVENOUS | Status: DC
Start: 2016-02-20 — End: 2016-02-21
  Filled 2016-02-20: qty 10

## 2016-02-20 MED ORDER — MILRINONE LACTATE IN DEXTROSE 20-5 MG/100ML-% IV SOLN
0.1250 ug/kg/min | Freq: Once | INTRAVENOUS | Status: AC
Start: 2016-02-20 — End: 2016-02-20
  Administered 2016-02-20: 0.25 ug/kg/min via INTRAVENOUS
  Filled 2016-02-20: qty 100

## 2016-02-20 MED ORDER — THROMBIN 5000 UNITS EX SOLR
CUTANEOUS | Status: DC | PRN
  Administered 2016-02-20: 5000 [IU] via TOPICAL

## 2016-02-20 MED FILL — Sodium Chloride IV Soln 0.9%: INTRAVENOUS | Qty: 2000 | Status: AC

## 2016-02-20 MED FILL — Heparin Sodium (Porcine) Inj 1000 Unit/ML: INTRAMUSCULAR | Qty: 30 | Status: AC

## 2016-02-20 MED FILL — Sodium Bicarbonate IV Soln 8.4%: INTRAVENOUS | Qty: 50 | Status: AC

## 2016-02-20 MED FILL — Magnesium Sulfate Inj 50%: INTRAMUSCULAR | Qty: 10 | Status: AC

## 2016-02-20 MED FILL — Lidocaine HCl IV Inj 20 MG/ML: INTRAVENOUS | Qty: 5 | Status: AC

## 2016-02-20 MED FILL — Heparin Sodium (Porcine) Inj 1000 Unit/ML: INTRAMUSCULAR | Qty: 10 | Status: AC

## 2016-02-20 MED FILL — Potassium Chloride Inj 2 mEq/ML: INTRAVENOUS | Qty: 40 | Status: AC

## 2016-02-20 MED FILL — Electrolyte-R (PH 7.4) Solution: INTRAVENOUS | Qty: 1000 | Status: AC

## 2016-02-20 MED FILL — Mannitol IV Soln 20%: INTRAVENOUS | Qty: 500 | Status: AC

## 2016-02-20 SURGICAL SUPPLY — 91 items
ADAPTER CARDIO PERF ANTE/RETRO (ADAPTER) ×4 IMPLANT
ADPR PRFSN 84XANTGRD RTRGD (ADAPTER) ×2
BAG DECANTER FOR FLEXI CONT (MISCELLANEOUS) ×4 IMPLANT
BANDAGE ACE 4X5 VEL STRL LF (GAUZE/BANDAGES/DRESSINGS) ×4 IMPLANT
BANDAGE ACE 6X5 VEL STRL LF (GAUZE/BANDAGES/DRESSINGS) ×4 IMPLANT
BANDAGE ELASTIC 4 VELCRO ST LF (GAUZE/BANDAGES/DRESSINGS) ×2 IMPLANT
BANDAGE ELASTIC 6 VELCRO ST LF (GAUZE/BANDAGES/DRESSINGS) ×2 IMPLANT
BASKET HEART  (ORDER IN 25'S) (MISCELLANEOUS) ×1
BASKET HEART (ORDER IN 25'S) (MISCELLANEOUS) ×1
BASKET HEART (ORDER IN 25S) (MISCELLANEOUS) ×2 IMPLANT
BLADE STERNUM SYSTEM 6 (BLADE) ×4 IMPLANT
BLADE SURG 12 STRL SS (BLADE) ×4 IMPLANT
BLADE SURG ROTATE 9660 (MISCELLANEOUS) IMPLANT
BNDG GAUZE ELAST 4 BULKY (GAUZE/BANDAGES/DRESSINGS) ×4 IMPLANT
CANISTER SUCTION 2500CC (MISCELLANEOUS) ×4 IMPLANT
CANNULA GUNDRY RCSP 15FR (MISCELLANEOUS) ×4 IMPLANT
CATH CPB KIT VANTRIGT (MISCELLANEOUS) ×4 IMPLANT
CATH ROBINSON RED A/P 18FR (CATHETERS) ×12 IMPLANT
CATH THORACIC 36FR RT ANG (CATHETERS) ×4 IMPLANT
CLIP TI WIDE RED SMALL 24 (CLIP) ×2 IMPLANT
CRADLE DONUT ADULT HEAD (MISCELLANEOUS) ×4 IMPLANT
DRAIN CHANNEL 32F RND 10.7 FF (WOUND CARE) ×4 IMPLANT
DRAPE CARDIOVASCULAR INCISE (DRAPES) ×4
DRAPE SLUSH/WARMER DISC (DRAPES) ×4 IMPLANT
DRAPE SRG 135X102X78XABS (DRAPES) ×2 IMPLANT
DRSG AQUACEL AG ADV 3.5X14 (GAUZE/BANDAGES/DRESSINGS) ×4 IMPLANT
ELECT BLADE 4.0 EZ CLEAN MEGAD (MISCELLANEOUS) ×4
ELECT BLADE 6.5 EXT (BLADE) ×4 IMPLANT
ELECT CAUTERY BLADE 6.4 (BLADE) ×4 IMPLANT
ELECT REM PT RETURN 9FT ADLT (ELECTROSURGICAL) ×8
ELECTRODE BLDE 4.0 EZ CLN MEGD (MISCELLANEOUS) ×2 IMPLANT
ELECTRODE REM PT RTRN 9FT ADLT (ELECTROSURGICAL) ×4 IMPLANT
FELT TEFLON 1X6 (MISCELLANEOUS) ×8 IMPLANT
GAUZE SPONGE 4X4 12PLY STRL (GAUZE/BANDAGES/DRESSINGS) ×8 IMPLANT
GLOVE BIO SURGEON STRL SZ7.5 (GLOVE) ×12 IMPLANT
GOWN STRL REUS W/ TWL LRG LVL3 (GOWN DISPOSABLE) ×8 IMPLANT
GOWN STRL REUS W/TWL LRG LVL3 (GOWN DISPOSABLE) ×16
HEMOSTAT POWDER SURGIFOAM 1G (HEMOSTASIS) ×12 IMPLANT
HEMOSTAT SURGICEL 2X14 (HEMOSTASIS) ×4 IMPLANT
INSERT FOGARTY XLG (MISCELLANEOUS) IMPLANT
KIT BASIN OR (CUSTOM PROCEDURE TRAY) ×4 IMPLANT
KIT ROOM TURNOVER OR (KITS) ×4 IMPLANT
KIT SUCTION CATH 14FR (SUCTIONS) ×4 IMPLANT
KIT VASOVIEW HEMOPRO VH 3000 (KITS) ×4 IMPLANT
LEAD PACING MYOCARDI (MISCELLANEOUS) ×4 IMPLANT
MARKER GRAFT CORONARY BYPASS (MISCELLANEOUS) ×12 IMPLANT
NS IRRIG 1000ML POUR BTL (IV SOLUTION) ×22 IMPLANT
PACK OPEN HEART (CUSTOM PROCEDURE TRAY) ×4 IMPLANT
PAD ARMBOARD 7.5X6 YLW CONV (MISCELLANEOUS) ×8 IMPLANT
PAD ELECT DEFIB RADIOL ZOLL (MISCELLANEOUS) ×4 IMPLANT
PENCIL BUTTON HOLSTER BLD 10FT (ELECTRODE) ×4 IMPLANT
PUNCH AORTIC ROTATE 4.0MM (MISCELLANEOUS) IMPLANT
PUNCH AORTIC ROTATE 4.5MM 8IN (MISCELLANEOUS) IMPLANT
PUNCH AORTIC ROTATE 5MM 8IN (MISCELLANEOUS) IMPLANT
SET CARDIOPLEGIA MPS 5001102 (MISCELLANEOUS) ×2 IMPLANT
SOLUTION ANTI FOG 6CC (MISCELLANEOUS) ×2 IMPLANT
SPONGE GAUZE 4X4 12PLY STER LF (GAUZE/BANDAGES/DRESSINGS) ×4 IMPLANT
SURGIFLO W/THROMBIN 8M KIT (HEMOSTASIS) ×4 IMPLANT
SUT BONE WAX W31G (SUTURE) ×4 IMPLANT
SUT MNCRL AB 4-0 PS2 18 (SUTURE) IMPLANT
SUT PROLENE 3 0 SH DA (SUTURE) IMPLANT
SUT PROLENE 3 0 SH1 36 (SUTURE) IMPLANT
SUT PROLENE 4 0 RB 1 (SUTURE) ×4
SUT PROLENE 4 0 SH DA (SUTURE) ×4 IMPLANT
SUT PROLENE 4-0 RB1 .5 CRCL 36 (SUTURE) ×2 IMPLANT
SUT PROLENE 5 0 C 1 36 (SUTURE) IMPLANT
SUT PROLENE 6 0 C 1 30 (SUTURE) IMPLANT
SUT PROLENE 6 0 CC (SUTURE) ×14 IMPLANT
SUT PROLENE 8 0 BV175 6 (SUTURE) ×6 IMPLANT
SUT PROLENE BLUE 7 0 (SUTURE) ×4 IMPLANT
SUT PROLENE POLY MONO (SUTURE) ×2 IMPLANT
SUT SILK  1 MH (SUTURE)
SUT SILK 1 MH (SUTURE) IMPLANT
SUT SILK 2 0 SH CR/8 (SUTURE) IMPLANT
SUT SILK 3 0 SH CR/8 (SUTURE) IMPLANT
SUT STEEL 6MS V (SUTURE) ×8 IMPLANT
SUT STEEL SZ 6 DBL 3X14 BALL (SUTURE) ×4 IMPLANT
SUT VIC AB 1 CTX 36 (SUTURE) ×8
SUT VIC AB 1 CTX36XBRD ANBCTR (SUTURE) ×4 IMPLANT
SUT VIC AB 2-0 CT1 27 (SUTURE) ×4
SUT VIC AB 2-0 CT1 TAPERPNT 27 (SUTURE) IMPLANT
SUT VIC AB 2-0 CTX 27 (SUTURE) IMPLANT
SUT VIC AB 3-0 X1 27 (SUTURE) ×2 IMPLANT
SUTURE E-PAK OPEN HEART (SUTURE) ×4 IMPLANT
SYSTEM SAHARA CHEST DRAIN ATS (WOUND CARE) ×4 IMPLANT
TOWEL OR 17X24 6PK STRL BLUE (TOWEL DISPOSABLE) ×8 IMPLANT
TOWEL OR 17X26 10 PK STRL BLUE (TOWEL DISPOSABLE) ×8 IMPLANT
TRAY FOLEY IC TEMP SENS 16FR (CATHETERS) ×4 IMPLANT
TUBING INSUFFLATION (TUBING) ×4 IMPLANT
UNDERPAD 30X30 (UNDERPADS AND DIAPERS) ×4 IMPLANT
WATER STERILE IRR 1000ML POUR (IV SOLUTION) ×8 IMPLANT

## 2016-02-20 NOTE — Progress Notes (Signed)
Pt back from heart surgery, ventilator set up and pressure lines zeroed. Pt looks good. Anesthesia said she was a grade 4 due to a a small mouth area which made it hard to see. RT will continue to monitor.

## 2016-02-20 NOTE — Transfer of Care (Signed)
Immediate Anesthesia Transfer of Care Note  Patient: Kelsey Hansen  Procedure(s) Performed: Procedure(s): CORONARY ARTERY BYPASS GRAFTING (CABG)x three,  using left internal mammary artery and right greater saphenous leg vein using endoscope. (N/A) TRANSESOPHAGEAL ECHOCARDIOGRAM (TEE) (N/A)  Patient Location: SICU  Anesthesia Type:General  Level of Consciousness: Patient remains intubated per anesthesia plan  Airway & Oxygen Therapy: Patient remains intubated per anesthesia plan and Patient placed on Ventilator (see vital sign flow sheet for setting)  Post-op Assessment: Post -op Vital signs reviewed and stable  Post vital signs: stable  Last Vitals:  Vitals:   02/20/16 0009 02/20/16 0602  BP: (!) 141/65 (!) 125/49  Pulse: (!) 57 (!) 53  Resp: 16 16  Temp: 36.4 C 36.4 C    Last Pain:  Vitals:   02/20/16 0602  TempSrc: Oral  PainSc:       Patients Stated Pain Goal: 0 (02/19/16 0736)  Complications: No apparent anesthesia complications

## 2016-02-20 NOTE — Procedures (Signed)
Extubation Procedure Note  Patient Details:   Name: Kelsey Hansen DOB: [birth year] MRN: [id number]   Airway Documentation:     Evaluation  O2 sats: stable throughout Complications: No apparent complications Patient did tolerate procedure well. Bilateral Breath Sounds: Rhonchi, Diminished   Yes Pt extubated at 20:57Hrs, to humidified 4 L/m O2 via [place]. No stridor noted post extubation, no increased respiratory rate. Pt successfully passed rapid wean protocol. Spontaneous mechanics achieved with sufficient results, VC 1.01 L and NIF -26.   Kelsey Hansen A Kelsey Hansen 02/20/2016, 9:04 PM

## 2016-02-20 NOTE — Anesthesia Preprocedure Evaluation (Signed)
Anesthesia Evaluation  Patient identified by MRN, date of birth, ID band Patient awake    Reviewed: Allergy & Precautions, H&P , NPO status , Patient's Chart, lab work & pertinent test results  History of Anesthesia Complications Negative for: history of anesthetic complications  Airway Mallampati: II  TM Distance: >3 FB Neck ROM: full    Dental no notable dental hx.    Pulmonary Current Smoker,    Pulmonary exam normal breath sounds clear to auscultation       Cardiovascular hypertension, + CAD, + Past MI and + Peripheral Vascular Disease  Normal cardiovascular exam Rhythm:regular Rate:Normal     Neuro/Psych PSYCHIATRIC DISORDERS Anxiety negative neurological ROS     GI/Hepatic negative GI ROS, Neg liver ROS,   Endo/Other  negative endocrine ROS  Renal/GU negative Renal ROS     Musculoskeletal   Abdominal   Peds  Hematology negative hematology ROS (+)   Anesthesia Other Findings   Reproductive/Obstetrics negative OB ROS                             Anesthesia Physical Anesthesia Plan  ASA: IV  Anesthesia Plan: General   Post-op Pain Management:    Induction: Intravenous  Airway Management Planned: Oral ETT  Additional Equipment: Arterial line, TEE, CVP, PA Cath and Ultrasound Guidance Line Placement  Intra-op Plan:   Post-operative Plan: Post-operative intubation/ventilation  Informed Consent: I have reviewed the patients History and Physical, chart, labs and discussed the procedure including the risks, benefits and alternatives for the proposed anesthesia with the patient or authorized representative who has indicated his/her understanding and acceptance.   Dental Advisory Given  Plan Discussed with: Anesthesiologist, CRNA and Surgeon  Anesthesia Plan Comments:         Anesthesia Quick Evaluation

## 2016-02-20 NOTE — Progress Notes (Signed)
The patient was examined and preop studies reviewed. There has been no change from the prior exam and the patient is ready for surgery.  plan CABG on [place]

## 2016-02-20 NOTE — Progress Notes (Signed)
RT started the rapid heart wean, 40% and RR 4 at 1813. RT will continue to monitor

## 2016-02-20 NOTE — Anesthesia Postprocedure Evaluation (Signed)
Anesthesia Post Note  Patient: Kelsey Hansen  Procedure(s) Performed: Procedure(s) (LRB): CORONARY ARTERY BYPASS GRAFTING (CABG)x three,  using left internal mammary artery and right greater saphenous leg vein using endoscope. (N/A) TRANSESOPHAGEAL ECHOCARDIOGRAM (TEE) (N/A)  Patient location during evaluation: SICU Anesthesia Type: General Level of consciousness: sedated Pain management: pain level controlled Vital Signs Assessment: post-procedure vital signs reviewed and stable Respiratory status: patient remains intubated per anesthesia plan Cardiovascular status: blood pressure returned to baseline and stable Postop Assessment: no signs of nausea or vomiting Anesthetic complications: no    Last Vitals:  Vitals:   02/20/16 0009 02/20/16 0602  BP: (!) 141/65 (!) 125/49  Pulse: (!) 57 (!) 53  Resp: 16 16  Temp: 36.4 C 36.4 C    Last Pain:  Vitals:   02/20/16 0602  TempSrc: Oral  PainSc:                  Reino KentJudd, Analaura Messler J

## 2016-02-20 NOTE — Anesthesia Procedure Notes (Signed)
Central Venous Catheter Insertion Performed by: anesthesiologist Patient location: Pre-op. Preanesthetic checklist: patient identified, IV checked, site marked, risks and benefits discussed, surgical consent, monitors and equipment checked, pre-op evaluation, timeout performed and anesthesia consent Position: Trendelenburg Landmarks identified Catheter size: 9 Fr PA cath was placed.MAC introducer Swan type and PA catheter depth:thermodilationProcedure performed using ultrasound guided technique. Attempts: 1 Following insertion, line sutured, dressing applied and Biopatch. Post procedure assessment: blood return through all ports. Patient tolerated the procedure well with no immediate complications.

## 2016-02-20 NOTE — Progress Notes (Signed)
  Echocardiogram Echocardiogram Transesophageal has been performed.  Kelsey Hansen, Kelsey Hansen R 02/20/2016, 9:49 AM

## 2016-02-20 NOTE — Anesthesia Procedure Notes (Signed)
Procedure Name: Intubation Date/Time: 02/20/2016 8:35 AM Performed by: Dorie RankQUINN, Chestine Belknap M Pre-anesthesia Checklist: Patient identified, Emergency Drugs available, Suction available, Patient being monitored and Timeout performed Patient Re-evaluated:Patient Re-evaluated prior to inductionOxygen Delivery Method: Circle system utilized Preoxygenation: Pre-oxygenation with 100% oxygen Intubation Type: IV induction Ventilation: Mask ventilation without difficulty Laryngoscope Size: Mac and 3 Grade View: Grade IV Tube type: Oral Tube size: 7.5 mm Number of attempts: 1 Airway Equipment and Method: Stylet Placement Confirmation: ETT inserted through vocal cords under direct vision,  positive ETCO2 and breath sounds checked- equal and bilateral Secured at: 22 cm Tube secured with: Tape Dental Injury: Teeth and Oropharynx as per pre-operative assessment  Difficulty Due To: Difficult Airway- due to anterior larynx and Difficult Airway- due to limited oral opening Future Recommendations: Recommend- induction with short-acting agent, and alternative techniques readily available Comments: DL x 1 with MAC 3 blade, limited Grade IV view, ETT passed easily, Dr Gentry RochJudd verified placement.

## 2016-02-20 NOTE — Progress Notes (Signed)
1821 placed Pt back on 500, 12, 50% 5 peep and 10PS because Pt wasn't awake enough. RN said we can try again in 30 minutes

## 2016-02-20 NOTE — Progress Notes (Signed)
      [address]       [place] [id number]             [phone]      S/p CABG x 3  Intubated, sedated  116/60 80 SR 1224/14 CI= 2.32  Minimal CT output  Doing well early postop  Viviann SpareSteven C. Dorris FetchHendrickson, MD [facility] [phone]

## 2016-02-20 NOTE — Brief Op Note (Signed)
02/15/2016 - 02/20/2016  12:44 PM      [address]       [place] [id number]             [phone]     02/15/2016 - 02/20/2016  12:45 PM  PATIENT:  Gaston Islamracy L Tesmer  50 y.o. female  PRE-OPERATIVE DIAGNOSIS:  CAD  POST-OPERATIVE DIAGNOSIS:  Coronary artery disease  PROCEDURE:  Procedure(s): CORONARY ARTERY BYPASS GRAFTING (CABG)x three,  using left internal mammary artery and right greater saphenous leg vein using endoscope.(EVH) LIMA-LAD; SVG-RAMUS; SVG-RCA TRANSESOPHAGEAL ECHOCARDIOGRAM (TEE)  SURGEON:  Surgeon(s): Kerin PernaPeter Van Trigt, MD  PHYSICIAN ASSISTANT: WAYNE GOLD PA-C  ANESTHESIA:   general  PATIENT CONDITION:  ICU - intubated and hemodynamically stable.  PRE-OPERATIVE WEIGHT: 65kg  COMPLICATIONS: NO KNOWN

## 2016-02-21 ENCOUNTER — Inpatient Hospital Stay (HOSPITAL_COMMUNITY): Payer: Medicare Other

## 2016-02-21 ENCOUNTER — Encounter (HOSPITAL_COMMUNITY): Payer: Self-pay | Admitting: Cardiothoracic Surgery

## 2016-02-21 LAB — POCT I-STAT 3, ART BLOOD GAS (G3+)
ACID-BASE EXCESS: 3 mmol/L — AB (ref 0.0–2.0)
ACID-BASE EXCESS: 6 mmol/L — AB (ref 0.0–2.0)
BICARBONATE: 30.5 mmol/L — AB (ref 20.0–28.0)
Bicarbonate: 28.6 mmol/L — ABNORMAL HIGH (ref 20.0–28.0)
O2 SAT: 100 %
O2 Saturation: 100 %
PCO2 ART: 45.8 mmHg (ref 32.0–48.0)
PH ART: 7.451 — AB (ref 7.350–7.450)
TCO2: 30 mmol/L (ref 0–100)
TCO2: 32 mmol/L (ref 0–100)
pCO2 arterial: 43.8 mmHg (ref 32.0–48.0)
pH, Arterial: 7.403 (ref 7.350–7.450)
pO2, Arterial: 449 mmHg — ABNORMAL HIGH (ref 83.0–108.0)
pO2, Arterial: 396 mmHg — ABNORMAL HIGH (ref 83.0–108.0)

## 2016-02-21 LAB — POCT I-STAT, CHEM 8
BUN: 7 mg/dL (ref 6–20)
BUN: 4 mg/dL — ABNORMAL LOW (ref 6–20)
BUN: 5 mg/dL — ABNORMAL LOW (ref 6–20)
BUN: 4 mg/dL — ABNORMAL LOW (ref 6–20)
BUN: 8 mg/dL (ref 6–20)
CALCIUM ION: 1.23 mmol/L (ref 1.15–1.40)
CALCIUM ION: 1.08 mmol/L — AB (ref 1.15–1.40)
CALCIUM ION: 1.31 mmol/L (ref 1.15–1.40)
CALCIUM ION: 1.25 mmol/L (ref 1.15–1.40)
CHLORIDE: 101 mmol/L (ref 101–111)
CREATININE: 0.3 mg/dL — AB (ref 0.44–1.00)
CREATININE: 0.2 mg/dL — AB (ref 0.44–1.00)
CREATININE: 0.8 mg/dL (ref 0.44–1.00)
Calcium, Ion: 0.97 mmol/L — ABNORMAL LOW (ref 1.15–1.40)
Chloride: 98 mmol/L — ABNORMAL LOW (ref 101–111)
Chloride: 100 mmol/L — ABNORMAL LOW (ref 101–111)
Chloride: 99 mmol/L — ABNORMAL LOW (ref 101–111)
Chloride: 99 mmol/L — ABNORMAL LOW (ref 101–111)
Creatinine, Ser: 0.4 mg/dL — ABNORMAL LOW (ref 0.44–1.00)
Creatinine, Ser: 0.5 mg/dL (ref 0.44–1.00)
GLUCOSE: 126 mg/dL — AB (ref 65–99)
GLUCOSE: 90 mg/dL (ref 65–99)
GLUCOSE: 130 mg/dL — AB (ref 65–99)
GLUCOSE: 157 mg/dL — AB (ref 65–99)
Glucose, Bld: 151 mg/dL — ABNORMAL HIGH (ref 65–99)
HCT: 36 % (ref 36.0–46.0)
HCT: 26 % — ABNORMAL LOW (ref 36.0–46.0)
HCT: 27 % — ABNORMAL LOW (ref 36.0–46.0)
HEMATOCRIT: 24 % — AB (ref 36.0–46.0)
HEMATOCRIT: 26 % — AB (ref 36.0–46.0)
HEMOGLOBIN: 8.2 g/dL — AB (ref 12.0–15.0)
HEMOGLOBIN: 8.8 g/dL — AB (ref 12.0–15.0)
HEMOGLOBIN: 9.2 g/dL — AB (ref 12.0–15.0)
Hemoglobin: 12.2 g/dL (ref 12.0–15.0)
Hemoglobin: 8.8 g/dL — ABNORMAL LOW (ref 12.0–15.0)
POTASSIUM: 3.6 mmol/L (ref 3.5–5.1)
POTASSIUM: 4 mmol/L (ref 3.5–5.1)
Potassium: 3.9 mmol/L (ref 3.5–5.1)
Potassium: 3.6 mmol/L (ref 3.5–5.1)
Potassium: 4.5 mmol/L (ref 3.5–5.1)
SODIUM: 141 mmol/L (ref 135–145)
SODIUM: 138 mmol/L (ref 135–145)
SODIUM: 139 mmol/L (ref 135–145)
Sodium: 144 mmol/L (ref 135–145)
Sodium: 138 mmol/L (ref 135–145)
TCO2: 29 mmol/L (ref 0–100)
TCO2: 31 mmol/L (ref 0–100)
TCO2: 27 mmol/L (ref 0–100)
TCO2: 26 mmol/L (ref 0–100)
TCO2: 27 mmol/L (ref 0–100)

## 2016-02-21 LAB — CBC
HCT: 26.2 % — ABNORMAL LOW (ref 36.0–46.0)
HEMATOCRIT: 27.4 % — AB (ref 36.0–46.0)
HEMOGLOBIN: 8.7 g/dL — AB (ref 12.0–15.0)
Hemoglobin: 8.4 g/dL — ABNORMAL LOW (ref 12.0–15.0)
MCH: 30.9 pg (ref 26.0–34.0)
MCH: 30.9 pg (ref 26.0–34.0)
MCHC: 32.1 g/dL (ref 30.0–36.0)
MCHC: 31.8 g/dL (ref 30.0–36.0)
MCV: 96.3 fL (ref 78.0–100.0)
MCV: 97.2 fL (ref 78.0–100.0)
PLATELETS: 183 10*3/uL (ref 150–400)
Platelets: 187 10*3/uL (ref 150–400)
RBC: 2.72 MIL/uL — AB (ref 3.87–5.11)
RBC: 2.82 MIL/uL — AB (ref 3.87–5.11)
RDW: 12.5 % (ref 11.5–15.5)
RDW: 12.5 % (ref 11.5–15.5)
WBC: 16.8 10*3/uL — AB (ref 4.0–10.5)
WBC: 18.6 10*3/uL — AB (ref 4.0–10.5)

## 2016-02-21 LAB — GLUCOSE, CAPILLARY
GLUCOSE-CAPILLARY: 112 mg/dL — AB (ref 65–99)
GLUCOSE-CAPILLARY: 131 mg/dL — AB (ref 65–99)
GLUCOSE-CAPILLARY: 160 mg/dL — AB (ref 65–99)
GLUCOSE-CAPILLARY: 73 mg/dL (ref 65–99)
GLUCOSE-CAPILLARY: 98 mg/dL (ref 65–99)
Glucose-Capillary: 89 mg/dL (ref 65–99)
Glucose-Capillary: 65 mg/dL (ref 65–99)
Glucose-Capillary: 143 mg/dL — ABNORMAL HIGH (ref 65–99)

## 2016-02-21 LAB — BASIC METABOLIC PANEL
ANION GAP: 6 (ref 5–15)
BUN: 6 mg/dL (ref 6–20)
CALCIUM: 8.1 mg/dL — AB (ref 8.9–10.3)
CO2: 25 mmol/L (ref 22–32)
Chloride: 108 mmol/L (ref 101–111)
Creatinine, Ser: 0.58 mg/dL (ref 0.44–1.00)
GLUCOSE: 129 mg/dL — AB (ref 65–99)
Potassium: 4 mmol/L (ref 3.5–5.1)
SODIUM: 139 mmol/L (ref 135–145)

## 2016-02-21 LAB — CREATININE, SERUM: Creatinine, Ser: 0.73 mg/dL (ref 0.44–1.00)

## 2016-02-21 LAB — MAGNESIUM
MAGNESIUM: 2.2 mg/dL (ref 1.7–2.4)
MAGNESIUM: 1.8 mg/dL (ref 1.7–2.4)

## 2016-02-21 MED ORDER — LEVALBUTEROL HCL 0.63 MG/3ML IN NEBU: 0.6300 mg | INHALATION_SOLUTION | RESPIRATORY_TRACT | Status: DC | PRN

## 2016-02-21 MED ORDER — POTASSIUM CHLORIDE 10 MEQ/50ML IV SOLN
10.0000 meq | INTRAVENOUS | Status: AC
  Administered 2016-02-21 (×2): 10 meq via INTRAVENOUS
  Filled 2016-02-21 (×2): qty 50

## 2016-02-21 MED ORDER — INSULIN ASPART 100 UNIT/ML ~~LOC~~ SOLN
0.0000 [IU] | SUBCUTANEOUS | Status: DC
  Administered 2016-02-21: 2 [IU] via SUBCUTANEOUS

## 2016-02-21 MED ORDER — INSULIN ASPART 100 UNIT/ML ~~LOC~~ SOLN
0.0000 [IU] | SUBCUTANEOUS | Status: DC
  Administered 2016-02-21 – 2016-02-22 (×3): 2 [IU] via SUBCUTANEOUS

## 2016-02-21 MED ORDER — CLOPIDOGREL BISULFATE 75 MG PO TABS
75.0000 mg | ORAL_TABLET | Freq: Every day | ORAL | Status: DC
  Administered 2016-02-22 – 2016-03-01 (×9): 75 mg via ORAL
  Filled 2016-02-21 (×9): qty 1

## 2016-02-21 MED ORDER — METOCLOPRAMIDE HCL 5 MG/ML IJ SOLN
10.0000 mg | Freq: Four times a day (QID) | INTRAMUSCULAR | Status: DC
Start: 2016-02-21 — End: 2016-02-24
  Administered 2016-02-21 – 2016-02-24 (×11): 10 mg via INTRAVENOUS
  Filled 2016-02-21 (×11): qty 2

## 2016-02-21 MED ORDER — METOCLOPRAMIDE HCL 5 MG/ML IJ SOLN: 10.0000 mg | Freq: Four times a day (QID) | INTRAMUSCULAR | Status: DC

## 2016-02-21 MED ORDER — LEVALBUTEROL HCL 1.25 MG/0.5ML IN NEBU
1.2500 mg | INHALATION_SOLUTION | Freq: Three times a day (TID) | RESPIRATORY_TRACT | Status: DC
  Administered 2016-02-21 – 2016-02-24 (×10): 1.25 mg via RESPIRATORY_TRACT
  Filled 2016-02-21 (×12): qty 0.5

## 2016-02-21 MED ORDER — FUROSEMIDE 10 MG/ML IJ SOLN
20.0000 mg | Freq: Two times a day (BID) | INTRAMUSCULAR | Status: DC
  Administered 2016-02-21 – 2016-02-22 (×3): 20 mg via INTRAVENOUS
  Filled 2016-02-21 (×3): qty 2

## 2016-02-21 NOTE — Progress Notes (Signed)
Patient ID: Kelsey Hansen, female   DOB: 1965/08/27, 50 y.o.   MRN: [id number] SICU Evening Rounds:  Hemodynamically stable today. Sinus 82  Urine output ok  CBC    Component Value Date/Time   WBC 18.6 (H) 02/21/2016 1700   RBC 2.82 (L) 02/21/2016 1700   HGB 9.2 (L) 02/21/2016 1720   HCT 27.0 (L) 02/21/2016 1720   PLT 187 02/21/2016 1700   MCV 97.2 02/21/2016 1700   MCH 30.9 02/21/2016 1700   MCHC 31.8 02/21/2016 1700   RDW 12.5 02/21/2016 1700   LYMPHSABS 3.9 02/15/2016 1018   MONOABS 0.6 02/15/2016 1018   EOSABS 0.2 02/15/2016 1018   BASOSABS 0.0 02/15/2016 1018   BMET    Component Value Date/Time   NA 138 02/21/2016 1720   K 4.5 02/21/2016 1720   CL 99 (L) 02/21/2016 1720   CO2 25 02/21/2016 0349   GLUCOSE 157 (H) 02/21/2016 1720   BUN 8 02/21/2016 1720   CREATININE 0.80 02/21/2016 1720   CALCIUM 8.1 (L) 02/21/2016 0349   GFRNONAA >60 02/21/2016 1700   GFRAA >60 02/21/2016 1700

## 2016-02-21 NOTE — Progress Notes (Addendum)
1 Day Post-Op Procedure(s) (LRB): CORONARY ARTERY BYPASS GRAFTING (CABG)x three,  using left internal mammary artery and right greater saphenous leg vein using endoscope. (N/A) TRANSESOPHAGEAL ECHOCARDIOGRAM (TEE) (N/A) Subjective: Extubated stable 100 cc from chest tubes with dangle  Objective: Vital signs in last 24 hours: Temp:  [97.5 F (36.4 C)-99.3 F (37.4 C)] 99.1 F (37.3 C) (09/07 0745) Pulse Rate:  [64-94] 68 (09/07 0745) Cardiac Rhythm: Normal sinus rhythm (09/07 0740) Resp:  [11-23] 12 (09/07 0745) BP: (80-121)/(53-65) 113/63 (09/07 0745) SpO2:  [10 %-100 %] 99 % (09/07 0745) Arterial Line BP: (95-150)/(43-67) 150/52 (09/07 0745) FiO2 (%):  [40 %-50 %] 40 % (09/06 2020) Weight:  [154 lb 1.6 oz (69.9 kg)] 154 lb 1.6 oz (69.9 kg) (09/07 0617)  Hemodynamic parameters for last 24 hours: PAP: (18-30)/(5-15) 29/12 CVP:  [17 mmHg] 17 mmHg CO:  [3.6 L/min-6.5 L/min] 5.6 L/min CI:  [2.2 L/min/m2-3.9 L/min/m2] 3.4 L/min/m2  Intake/Output from previous day: 09/06 0701 - 09/07 0700 In: 4079.8 [I.V.:2735.8; Blood:144; IV Piggyback:1200] Out: 3625 [Urine:3225; Chest Tube:400] Intake/Output this shift: No intake/output data recorded.       Exam    General- alert and comfortable   Lungs- clear without rales, wheezes   Cor- regular rate and rhythm, no murmur , gallop   Abdomen- soft, non-tender   Extremities - warm, non-tender, minimal edema   Neuro- oriented, appropriate, no focal weakness   Lab Results:  Recent Labs  02/20/16 2000 02/20/16 2033 02/21/16 0349  WBC 21.4*  --  16.8*  HGB 8.8* 11.9* 8.4*  HCT 26.7* 35.0* 26.2*  PLT 191  --  183   BMET:  Recent Labs  02/20/16 0503  02/20/16 2033 02/21/16 0349  NA 138  < > 140 139  K 3.6  < > 3.9 4.0  CL 97*  --  108 108  CO2 28  --   --  25  GLUCOSE 120*  < > 127* 129*  BUN 8  --  6 6  CREATININE 0.58  < > 0.50 0.58  CALCIUM 8.9  --   --  8.1*  < > = values in this interval not displayed.  PT/INR:   Recent Labs  02/20/16 1412  LABPROT 17.8*  INR 1.45   ABG    Component Value Date/Time   PHART 7.309 (L) 02/20/2016 2309   HCO3 24.9 02/20/2016 2309   TCO2 26 02/20/2016 2309   ACIDBASEDEF 2.0 02/20/2016 2309   O2SAT 96.0 02/20/2016 2309   CBG (last 3)   Recent Labs  02/21/16 0007 02/21/16 0112 02/21/16 0355  GLUCAP 73 65 131*    Assessment/Plan: S/P Procedure(s) (LRB): CORONARY ARTERY BYPASS GRAFTING (CABG)x three,  using left internal mammary artery and right greater saphenous leg vein using endoscope. (N/A) TRANSESOPHAGEAL ECHOCARDIOGRAM (TEE) (N/A) Mobilize Diuresis Diabetes control See progression orders Resume preop plavix for PVD tomorrow  LOS: 6 days    Kelsey Hansen 02/21/2016

## 2016-02-21 NOTE — Progress Notes (Signed)
SUBJECTIVE: Pt awake, no complaints.   Tele: sinus  BP 121/60   Pulse 64   Temp 99.1 F (37.3 C)   Resp 13   Ht 5\' 2"  (1.575 m)   Wt 154 lb 1.6 oz (69.9 kg)   SpO2 100%   BMI 28.19 kg/m   Intake/Output Summary (Last 24 hours) at 02/21/16 0740 Last data filed at 02/21/16 0700  Gross per 24 hour  Intake          4079.82 ml  Output             3625 ml  Net           454.82 ml    PHYSICAL EXAM General: Well developed, well nourished, in no acute distress. Alert and oriented x 3.  Psych:  Good affect, responds appropriately Neck: No JVD. No masses noted.  Lungs: Clear bilaterally with no wheezes or rhonci noted.  Heart: RRR with no murmurs noted. Abdomen: Bowel sounds are present. Soft, non-tender.  Extremities: No lower extremity edema.   LABS: Basic Metabolic Panel:  Recent Labs  16/03/9608/06/17 0503  02/20/16 2000 02/20/16 2033 02/21/16 0349  NA 138  < >  --  140 139  K 3.6  < >  --  3.9 4.0  CL 97*  --   --  108 108  CO2 28  --   --   --  25  GLUCOSE 120*  < >  --  127* 129*  BUN 8  --   --  6 6  CREATININE 0.58  --  0.60 0.50 0.58  CALCIUM 8.9  --   --   --  8.1*  MG  --   --  2.7*  --  2.2  < > = values in this interval not displayed. CBC:  Recent Labs  02/20/16 2000 02/20/16 2033 02/21/16 0349  WBC 21.4*  --  16.8*  HGB 8.8* 11.9* 8.4*  HCT 26.7* 35.0* 26.2*  MCV 95.0  --  96.3  PLT 191  --  183    Current Meds: . acetaminophen  1,000 mg Oral Q6H   Or  . acetaminophen (TYLENOL) oral liquid 160 mg/5 mL  1,000 mg Per Tube Q6H  . aspirin EC  325 mg Oral Daily   Or  . aspirin  324 mg Per Tube Daily  . bisacodyl  10 mg Oral Daily   Or  . bisacodyl  10 mg Rectal Daily  . cefUROXime (ZINACEF)  IV  1.5 g Intravenous Q12H  . chlorhexidine  15 mL Mouth Rinse BID  . docusate sodium  200 mg Oral Daily  . furosemide  20 mg Intravenous BID  . insulin aspart  0-24 Units Subcutaneous Q4H  . levalbuterol  1.25 mg Nebulization Q6H  . metoprolol  tartrate  12.5 mg Oral BID   Or  . metoprolol tartrate  12.5 mg Per Tube BID  . mometasone-formoterol  2 puff Inhalation BID  . [START ON 02/22/2016] pantoprazole  40 mg Oral Daily  . potassium chloride  10 mEq Intravenous Q1 Hr x 2  . sodium chloride flush  3 mL Intravenous Q12H     ASSESSMENT AND PLAN:  1. CAD with unstable angina: Pt with severe triple vessel CAD by cath 02/15/16. She is now POD #1 s/p 3V CABG 02/20/16. Currently on ASA and beta blocker. She is statin intolerant. She is doing well this am. Post-op volume overload and on Lasix.   2. Post-op  anemia: Follow H/H.    Kelsey Hansen  9/7/20177:40 AM

## 2016-02-21 NOTE — Care Management Important Message (Signed)
Important Message  Patient Details  Name: Kelsey Hansen MRN: [id number] Date of Birth: [birth year]   Medicare Important Message Given:  Yes    Kyla BalzarineShealy, Nena Hampe Abena 02/21/2016, 10:29 AM

## 2016-02-21 NOTE — Op Note (Signed)
NAMEMarland Kitchen  HARMONI, LUCUS NO.:  [id number]  MEDICAL RECORD NO.:  [id number]  LOCATION:  2S05C                        FACILITY:  [facility]  PHYSICIAN:  Kerin Perna, M.D.  DATE OF BIRTH:  [birth year]  DATE OF PROCEDURE:  02/20/2016 DATE OF DISCHARGE:                              OPERATIVE REPORT   OPERATION: 1. Coronary artery bypass grafting x3 (left internal mammary artery to     LAD, saphenous vein graft to ramus intermediate, saphenous vein     graft to right coronary artery). 2. Endoscopic harvest of right leg greater saphenous vein.  SURGEON:  Kerin Perna, M.D.  ASSISTANT:  Rowe Clack, PA-C.  PREOPERATIVE DIAGNOSIS:  Severe three-vessel coronary artery disease with unstable angina.  POSTOPERATIVE DIAGNOSIS:  Severe three-vessel coronary artery disease with unstable angina.  ANESTHESIA:  General by Dr. Blima Dessert.  CLINICAL NOTE:  The patient is a 50 year old smoker with history of having peripheral vascular disease status post fem-pop bypass as well as brachial artery reconstructive surgery in the past.  She presented with symptoms of unstable angina and underwent cardiac catheterization.  This demonstrated a high-grade proximal LAD stenosis, high-grade RCA stenosis and a high-grade ramus intermediate stenosis.  There was disease of a small distal circumflex vessel as well.  Overall LV systolic function was fairly well preserved.  She was felt to be candidate for surgical coronary revascularization.  I examined the patient in her hospital room following cardiac catheterization and discussed the results of the study.  I discussed the indications and expected benefits of coronary artery bypass surgery for treatment of her severe coronary artery disease.  I discussed the major aspects of the surgery including the use of general anesthesia and cardiopulmonary bypass, the location of the surgical incisions, and the expected postoperative hospital  recovery.  I discussed with the patient.  The potential risks of stroke, bleeding, blood transfusion requirement, MI, postoperative cardiac arrhythmias, postoperative pulmonary problems including pleural effusion, and pneumonia, infection, and death.  After reviewing these issues, she demonstrated her understanding and agreed to proceed with surgery under what I felt was an informed consent.  OPERATIVE FINDINGS: 1. Adequate conduit. 2. No blood products required for the surgery. 3. Severe CAD successfully treated with CABG x3, with preservation of     LV function on TEE after separation from cardiopulmonary bypass.  OPERATIVE PROCEDURE:  The patient was brought to the operating room and placed supine on the operating table where general anesthesia was induced under invasive hemodynamic monitoring.  The chest, abdomen, and legs were prepped with Betadine and draped as a sterile field.  A transesophageal echo was placed by the anesthesiologist.  A proper time- out was performed.  A sternal incision was made as the saphenous vein was harvested endoscopically from the right leg.  The left internal mammary artery was harvested as a pedicle graft from its origin at the subclavian vessels. It was a 1.5-mm vessel with excellent flow.  A sternal retractor was then placed and the pericardium was opened and suspended.  The aorta was inspected, palpated and examined.  It had no significant calcium or plaque.  Pursestrings were placed in ascending aorta  and right atrium and after heparin had been administered, the ACT was documented as being therapeutic.  The patient was then cannulated through the pursestrings and placed on cardiopulmonary bypass.  The coronaries were identified for grafting.  The distal circumflex was too small to graft.  Otherwise, the LAD, ramus and RCA were diseased but adequate targets.  Cardioplegia cannulas were placed for both antegrade and retrograde cold blood  cardioplegia and the mammary artery and vein grafts were prepared for the distal anastomoses.  Cardioplegia cannulas were then placed and the patient was cooled to 32 degrees.  The aortic crossclamp was applied.  A liter of cold blood cardioplegia was delivered in split doses between the antegrade aortic and retrograde coronary sinus catheters.  There was good cardioplegic arrest, and septal temperature dropped less than 12 degrees.  Cardioplegia was delivered every 20 minutes.  The distal coronary anastomoses were performed.  The first distal anastomosis was the RCA.  This was a small 1.3-mm vessel with a posterior plaque and a proximal 95% stenosis.  A reverse saphenous vein was sewn end-to-side with running 7-0 Prolene.  There was good flow through the graft.  Cardioplegia was redosed.  The second distal anastomosis was the ramus intermediate branch of the left coronary.  This had a proximal 90% stenosis.  It was 1.5-mm vessel. A reverse saphenous vein was sewn end-to-side with running 7-0 Prolene with good flow through the graft.  Cardioplegia was redosed.  The third distal anastomosis was the distal third of the LAD.  More proximally, it was intramyocardial.  The left IMA pedicle was brought through an opening in the left lateral pericardium and was brought down onto the LAD and sewn end-to-side with running 8-0 Prolene.  There was good flow through the anastomosis after briefly releasing the pedicle bulldog on the mammary artery.  The bulldog was re-applied and the pedicle was secured to the epicardium with 6-0 Prolenes.  Cardioplegia was re-dosed.  With the crossclamp still in place, 2 proximal vein anastomoses were performed on the ascending aorta using a 4.5-mm punch running 6-0 Prolene.  Prior to releasing the cross-clamp, air was vented from the coronaries with a dose of retrograde warm blood cardioplegia in the usual de-airing maneuvers.  The crossclamp was  removed.  The heart was cardioverted back to a regular rhythm.  The vein grafts were de-aired and opened and each had good flow and hemostasis was documented at proximal and distal anastomosis.  The patient was rewarmed and reperfused.  Temporary pacing wires were applied.  The lungs were expanded, the ventilator was resumed.  The patient was started on a low- dose Milrinone.  The patient was weaned off cardiopulmonary bypass without difficulty.  Echo showed normal preserved LV systolic function. Hemodynamics were stable.  Protamine was administered without adverse reaction.  The cannula was removed.  The patient remained stable.  The pericardium was irrigated with warm saline.  The superior pericardial fat was closed over the aorta.  Anterior mediastinal and left pleural chest tubes were placed and brought out through separate incisions.  The sternum was closed with a wire. The patient remained stable.  The pectoralis fascia and subcutaneous layers were closed with running Vicryl.  The skin was closed with a subcuticular closure.  The total cardiopulmonary bypass time was 110 minutes.  The patient returned to the ICU in stable condition.     Kerin Perna, M.D.     PV/MEDQ  D:  02/20/2016  T:  02/21/2016  Job:  [id number]  cc:   Lyn RecordsHenry W. Smith, M.D.

## 2016-02-22 ENCOUNTER — Inpatient Hospital Stay (HOSPITAL_COMMUNITY): Payer: Medicare Other

## 2016-02-22 LAB — GLUCOSE, CAPILLARY
Glucose-Capillary: 120 mg/dL — ABNORMAL HIGH (ref 65–99)
Glucose-Capillary: 130 mg/dL — ABNORMAL HIGH (ref 65–99)
Glucose-Capillary: 148 mg/dL — ABNORMAL HIGH (ref 65–99)

## 2016-02-22 LAB — BASIC METABOLIC PANEL
Anion gap: 7 (ref 5–15)
BUN: 9 mg/dL (ref 6–20)
CO2: 26 mmol/L (ref 22–32)
Calcium: 8.6 mg/dL — ABNORMAL LOW (ref 8.9–10.3)
Chloride: 103 mmol/L (ref 101–111)
Creatinine, Ser: 0.71 mg/dL (ref 0.44–1.00)
GFR calc Af Amer: >60 mL/min (ref >60)
GFR calc non Af Amer: >60 mL/min (ref >60)
Glucose, Bld: 116 mg/dL — ABNORMAL HIGH (ref 65–99)
Potassium: 4.1 mmol/L (ref 3.5–5.1)
Sodium: 136 mmol/L (ref 135–145)

## 2016-02-22 LAB — CBC
HCT: 26.5 % — ABNORMAL LOW (ref 36.0–46.0)
Hemoglobin: 8.6 g/dL — ABNORMAL LOW (ref 12.0–15.0)
MCH: 31.5 pg (ref 26.0–34.0)
MCHC: 32.5 g/dL (ref 30.0–36.0)
MCV: 97.1 fL (ref 78.0–100.0)
Platelets: 179 10*3/uL (ref 150–400)
RBC: 2.73 MIL/uL — ABNORMAL LOW (ref 3.87–5.11)
RDW: 12.5 % (ref 11.5–15.5)
WBC: 19.9 10*3/uL — ABNORMAL HIGH (ref 4.0–10.5)

## 2016-02-22 MED ORDER — FENTANYL CITRATE (PF) 100 MCG/2ML IJ SOLN
50.0000 ug | Freq: Two times a day (BID) | INTRAMUSCULAR | Status: DC | PRN
  Administered 2016-02-25: 50 ug via INTRAVENOUS
  Filled 2016-02-22: qty 2

## 2016-02-22 MED ORDER — METOPROLOL TARTRATE 25 MG PO TABS
25.0000 mg | ORAL_TABLET | Freq: Two times a day (BID) | ORAL | Status: DC
  Administered 2016-02-22 – 2016-02-26 (×8): 25 mg via ORAL
  Filled 2016-02-22 (×9): qty 1

## 2016-02-22 MED ORDER — SODIUM CHLORIDE 0.9% FLUSH: 3.0000 mL | INTRAVENOUS | Status: DC | PRN

## 2016-02-22 MED ORDER — MAGNESIUM HYDROXIDE 400 MG/5ML PO SUSP: 30.0000 mL | Freq: Every day | ORAL | Status: DC | PRN

## 2016-02-22 MED ORDER — MOVING RIGHT ALONG BOOK
Freq: Once | Status: AC
  Administered 2016-02-22: 10:00:00
  Filled 2016-02-22: qty 1

## 2016-02-22 MED ORDER — SODIUM CHLORIDE 0.9 % IV SOLN: 250.0000 mL | INTRAVENOUS | Status: DC | PRN

## 2016-02-22 MED ORDER — POTASSIUM CHLORIDE CRYS ER 20 MEQ PO TBCR
20.0000 meq | EXTENDED_RELEASE_TABLET | Freq: Every day | ORAL | Status: DC
  Administered 2016-02-23 – 2016-02-28 (×6): 20 meq via ORAL
  Filled 2016-02-22 (×6): qty 1

## 2016-02-22 MED ORDER — ALPRAZOLAM 0.25 MG PO TABS
0.2500 mg | ORAL_TABLET | Freq: Two times a day (BID) | ORAL | Status: DC | PRN
  Administered 2016-02-25 – 2016-02-29 (×9): 0.25 mg via ORAL
  Filled 2016-02-22 (×9): qty 1

## 2016-02-22 MED ORDER — SODIUM CHLORIDE 0.9% FLUSH
3.0000 mL | Freq: Two times a day (BID) | INTRAVENOUS | Status: DC
  Administered 2016-02-22 – 2016-02-29 (×9): 3 mL via INTRAVENOUS

## 2016-02-22 MED ORDER — FUROSEMIDE 40 MG PO TABS
40.0000 mg | ORAL_TABLET | Freq: Every day | ORAL | Status: DC
  Administered 2016-02-23 – 2016-03-01 (×8): 40 mg via ORAL
  Filled 2016-02-22 (×8): qty 1

## 2016-02-22 MED ORDER — GUAIFENESIN ER 600 MG PO TB12: 600.0000 mg | ORAL_TABLET | Freq: Two times a day (BID) | ORAL | Status: DC | PRN

## 2016-02-22 MED ORDER — METOPROLOL TARTRATE 25 MG/10 ML ORAL SUSPENSION: 12.5000 mg | Freq: Two times a day (BID) | ORAL | Status: DC

## 2016-02-22 MED ORDER — IOPAMIDOL (ISOVUE-300) INJECTION 61%
INTRAVENOUS | Status: AC
  Filled 2016-02-22: qty 50

## 2016-02-22 MED ORDER — FUROSEMIDE 10 MG/ML IJ SOLN
40.0000 mg | Freq: Once | INTRAMUSCULAR | Status: AC
  Administered 2016-02-22: 40 mg via INTRAVENOUS
  Filled 2016-02-22: qty 4

## 2016-02-22 NOTE — Discharge Instructions (Signed)
Endoscopic Saphenous Vein Harvesting, Care After °Refer to this sheet in the next few weeks. These instructions provide you with information on caring for yourself after your procedure. Your health care provider may also give you more specific instructions. Your treatment has been planned according to current medical practices, but problems sometimes occur. Call your health care provider if you have any problems or questions after your procedure. °HOME CARE INSTRUCTIONS °Medicine °· Take whatever pain medicine your surgeon prescribes. Follow the directions carefully. Do not take over-the-counter pain medicine unless your surgeon says it is okay. Some pain medicine can cause bleeding problems for several weeks after surgery. °· Follow your surgeon's instructions about driving. You will probably not be permitted to drive after heart surgery. °· Take any medicines your surgeon prescribes. Any medicines you took before your heart surgery should be checked with your health care provider before you start taking them again. °Wound care °· If your surgeon has prescribed an elastic bandage or stocking, ask how long you should wear it. °· Check the area around your surgical cuts (incisions) whenever your bandages (dressings) are changed. Look for any redness or swelling. °· You will need to return to have the stitches (sutures) or staples taken out. Ask your surgeon when to do that. °· Ask your surgeon when you can shower or bathe. °Activity °· Try to keep your legs raised when you are sitting. °· Do any exercises your health care providers have given you. These may include deep breathing exercises, coughing, walking, or other exercises. °SEEK MEDICAL CARE IF: °· You have any questions about your medicines. °· You have more leg pain, especially if your pain medicine stops working. °· New or growing bruises develop on your leg. °· Your leg swells, feels tight, or becomes red. °· You have numbness in your leg. °SEEK IMMEDIATE  MEDICAL CARE IF: °· Your pain gets much worse. °· Blood or fluid leaks from any of the incisions. °· Your incisions become warm, swollen, or red. °· You have chest pain. °· You have trouble breathing. °· You have a fever. °· You have more pain near your leg incision. °MAKE SURE YOU: °· Understand these instructions. °· Will watch your condition. °· Will get help right away if you are not doing well or get worse. °  °This information is not intended to replace advice given to you by your health care provider. Make sure you discuss any questions you have with your health care provider. °  °Document Released: 02/12/2011 Document Revised: 06/23/2014 Document Reviewed: 02/12/2011 °Elsevier Interactive Patient Education ©2016 [employer] °Coronary Artery Bypass Grafting, Care After °These instructions give you information on caring for yourself after your procedure. Your doctor may also give you more specific instructions. Call your doctor if you have any problems or questions after your procedure.  °HOME CARE °· Only take medicine as told by your doctor. Take medicines exactly as told. Do not stop taking medicines or start any new medicines without talking to your doctor first. °· Take your pulse as told by your doctor. °· Do deep breathing as told by your doctor. Use your breathing device (incentive spirometer), if given, to practice deep breathing several times a day. Support your chest with a pillow or your arms when you take deep breaths or cough. °· Keep the area clean, dry, and protected where the surgery cuts (incisions) were made. Remove bandages (dressings) only as told by your doctor. If strips were applied to surgical area, do not take   them off. They fall off on their own. °· Check the surgery area daily for puffiness (swelling), redness, or leaking fluid. °· If surgery cuts were made in your legs: °· Avoid crossing your legs. °· Avoid sitting for long periods of time. Change positions every 30  minutes. °· Raise your legs when you are sitting. Place them on pillows. °· Wear stockings that help keep blood clots from forming in your legs (compression stockings). °· Only take sponge baths until your doctor says it is okay to take showers. Pat the surgery area dry. Do not rub the surgery area with a washcloth or towel. Do not bathe, swim, or use a hot tub until your doctor says it is okay. °· Eat foods that are high in fiber. These include raw fruits and vegetables, whole grains, beans, and nuts. Choose lean meats. Avoid canned, processed, and fried foods. °· Drink enough fluids to keep your pee (urine) clear or pale yellow. °· Weigh yourself every day. °· Rest and limit activity as told by your doctor. You may be told to: °· Stop any activity if you have chest pain, shortness of breath, changes in heartbeat, or dizziness. Get help right away if this happens. °· Move around often for short amounts of time or take short walks as told by your doctor. Gradually become more active. You may need help to strengthen your muscles and build endurance. °· Avoid lifting, pushing, or pulling anything heavier than 10 pounds (4.5 kg) for at least 6 weeks after surgery. °· Do not drive until your doctor says it is okay. °· Ask your doctor when you can go back to work. °· Ask your doctor when you can begin sexual activity again. °· Follow up with your doctor as told. °GET HELP IF: °· You have puffiness, redness, more pain, or fluid draining from the incision site. °· You have a fever. °· You have puffiness in your ankles or legs. °· You have pain in your legs. °· You gain 2 or more pounds (0.9 kg) a day. °· You feel sick to your stomach (nauseous) or throw up (vomit). °· You have watery poop (diarrhea). °GET HELP RIGHT AWAY IF: °· You have chest pain that goes to your jaw or arms. °· You have shortness of breath. °· You have a fast or irregular heartbeat. °· You notice a "clicking" in your breastbone when you move. °· You  have numbness or weakness in your arms or legs. °· You feel dizzy or light-headed. °MAKE SURE YOU: °· Understand these instructions. °· Will watch your condition. °· Will get help right away if you are not doing well or get worse. °  °This information is not intended to replace advice given to you by your health care provider. Make sure you discuss any questions you have with your health care provider. °  °Document Released: 06/07/2013 Document Reviewed: 06/07/2013 °Elsevier Interactive Patient Education ©2016 Elsevier Inc. ° °

## 2016-02-22 NOTE — Progress Notes (Signed)
Transferred patient to 2W24, receiving RN at bedside, patient placed on tele, sitting in chair with call bell in reach, no questions from RN or patient.  Hermina BartersBOWMAN, Serigne Kubicek M, RN

## 2016-02-22 NOTE — Progress Notes (Signed)
Called to get report with no answer left number with secretary.

## 2016-02-22 NOTE — Discharge Summary (Signed)
Physician Discharge Summary  Patient ID: Kelsey Hansen MRN: [id number] DOB/AGE: 1966-04-06 50 y.o.  Admit date: 02/15/2016 Discharge date: 03/01/2016  Admission Diagnoses:CAD  Discharge Diagnoses:   Active Problems:   TOBACCO ABUSE   Coronary atherosclerosis of native coronary artery   Essential hypertension, benign   Mixed hyperlipidemia   PVD (peripheral vascular disease) with claudication (HCC)   Unstable angina (HCC)   Coronary artery disease   S/P CABG (coronary artery bypass graft)  Patient Active Problem List   Diagnosis Date Noted  . S/P CABG (coronary artery bypass graft) 02/20/2016  . Coronary artery disease 02/16/2016  . Unstable angina (HCC)   . Pain in limb-Right groin, Bilat Leg,Right shoulder 08/19/2013  . PVD (peripheral vascular disease) with claudication (HCC) 08/19/2013  . Mixed hyperlipidemia 04/21/2012  . Essential hypertension, benign 05/16/2011  . TOBACCO ABUSE 05/01/2008  . Coronary atherosclerosis of native coronary artery 05/01/2008  . PERIPHERAL VASCULAR DISEASE WITH CLAUDICATION 05/01/2008  Mild sternal drainage but no cellulities  Clinical Summary 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  The patient was seen by Dr. Diona Browner on 02/12/2016 with recurrent chest discomfort some improvement with nitroglycerin, with discomfort in her shoulders and arms with intermittent angina. A Lexiscan stress test was ordered for 02/15/2016.  During Lexiscan stress test she had significantly positive ST depression inferior laterally with associated 8/10 chest pain requiring one sublingual nitroglycerin. Pain was  relieved after nitroglycerin received. During stress test blood pressure was significantly elevated at 196/100. She did have significant nausea without vomiting. As result of abnormal EKG, Dr. Tenny Craw was informed to review the EKG and confirm significant abnormalities. Dr. Tenny Craw up with the patient about going to catheterization lab at [facility]. She discussed the results of the stress test and her concerns about this being high risk test. The patient is willing to proceed with catheterization.  She was sent to [facility] ER, heparin was started stat labs CBC, BMET, PT/INR are drawn. The patient was given aspirin. [facility] was called and transport is arranged. She will be transferred to stepdown at [facility] while for catheterization.    Discharged Condition: good  Hospital Course: The patient underwent cardiac catheterization on 02/15/2016 and was found to have severe multivessel coronary artery disease. Cardiothoracic surgical consultation was obtained and she was felt to be a candidate for coronary artery surgical revascularization. She continued to be medically managed including intravenous heparin and nitroglycerin. The surgery was scheduled and on 02/20/2016 she was taken the operating room where she underwent the below described procedure. She tolerated well was taken to the surgical intensive care unit in stable condition  Postoperative hospital course:  The patient has overall showed slow progress. All routine lines, monitors and drainage devices have been discontinued in standard fashion. She has been hemodynamically stable but does have a low relative blood pressure and ACE inhibitor Be started. She is on a low-dose beta blocker as she is somewhat bradycardic at times. She has had some sternal drainage postoperatively and has been placed on intravenous antibiotics and this is being monitored closely. She is currently on intravenous vancomycin and cefepime. Gram stain has  showed rare gram-negative rods and culture is pending. Per  Dr. Donata Clay, stop IV antibiotics and start oral Keflex 03/01/2016. She does have an expected acute blood loss anemia which is stable. She has required aggressive pulmonary toilet as well as significant COPD history. She is on 1 liter of oxygen via [place]. She is being evaluated on room air.  She is on dual antiplatelet management currently with aspirin and Plavix.[facility] will be arranged.  Per Dr. Donata Clay, she is stable for discharge today  Consults: None  Significant Diagnostic Studies: angiography: cardiac cath  Treatments: surgery:    DATE OF PROCEDURE:  02/20/2016 DATE OF DISCHARGE:                              OPERATIVE REPORT   OPERATION: 1. Coronary artery bypass grafting x3 (left internal mammary artery to     LAD, saphenous vein graft to ramus intermediate, saphenous vein     graft to right coronary artery). 2. Endoscopic harvest of right leg greater saphenous vein.  SURGEON:  Kerin Perna, M.D.  ASSISTANT:  Rowe Clack, PA-C.  PREOPERATIVE DIAGNOSIS:  Severe three-vessel coronary artery disease with unstable angina.  POSTOPERATIVE DIAGNOSIS:  Severe three-vessel coronary artery disease with unstable angina.   ANESTHESIA:  General by Dr. Blima Dessert.   Discharge Exam: Blood pressure 90/61, pulse 69, temperature 98.3 F (36.8 C), temperature source Oral, resp. rate 18, height 5\' 2"  (1.575 m), weight 138 lb 14.2 oz (63 kg), SpO2 96 %.  Cardiovascular: RRR, Pulmonary: Slightly diminished throughout Abdomen: Soft, non tender, bowel sounds present. Extremities: Trace bilateral lower extremity edema. Wounds: SCANT sternal drainage. No cellulitis or sternal instability noted.  Disposition: discharged home     Medication List    STOP taking these medications   carvedilol 12.5 MG tablet Commonly known as:  COREG   ibuprofen 200 MG tablet Commonly known as:  ADVIL,MOTRIN   isosorbide mononitrate  60 MG 24 hr tablet Commonly known as:  IMDUR   nitroGLYCERIN 0.4 MG SL tablet Commonly known as:  NITROSTAT     TAKE these medications   aspirin 325 MG EC tablet Take 1 tablet (325 mg total) by mouth daily. What changed:  medication strength  how much to take  when to take this   cephALEXin 500 MG capsule Commonly known as:  KEFLEX Take 1 capsule (500 mg total) by mouth 4 (four) times daily.   clopidogrel 75 MG tablet Commonly known as:  PLAVIX Take 1 tablet (75 mg total) by mouth once. What changed:  See the new instructions.   ferrous fumarate-b12-vitamic C-folic acid capsule Commonly known as:  TRINSICON / FOLTRIN Take 1 capsule by mouth 2 (two) times daily after a meal. For one month then stop.   guaiFENesin 600 MG 12 hr tablet Commonly known as:  MUCINEX Take 1 tablet (600 mg total) by mouth 2 (two) times daily as needed.   metoprolol tartrate 25 MG tablet Commonly known as:  LOPRESSOR Take 0.5 tablets (12.5 mg total) by mouth 2 (two) times daily.   pravastatin 40 MG tablet Commonly known as:  PRAVACHOL Take 1 tablet (40 mg total) by mouth daily at 6 PM.   traMADol 50 MG tablet Commonly known as:  ULTRAM Take 1 tablet (50 mg total) by mouth every 4 (four) hours as needed for moderate pain.      Follow-up Information    Kathlee Nations Trigt III, MD Follow up on 03/19/2016.   Specialty:  Cardiothoracic Surgery Why:  Appointment time is at 2:30 pm Contact information: [address] [place] [address] [phone]        Nona DellSamuel McDowell, MD Follow up on 03/26/2016.   Specialty:  Cardiology Why:  Appointment time is at 3:00 pm Contact information: [address] [place] [address] [phone]          The patient has been discharged on:   1.Beta Blocker:  Yes [place].Etienne[y   ]                              No   [   ]                              If No, reason:  2.Ace Inhibitor/ARB: Yes [   ]                                     No   [  n  ]                                     If No, reason:BP too low  3.Statin:   Yes [ y  ]                  No  [   ]                  If No, reason:  4.Ecasa:  Yes  [ y  ]                  No   [   ]                  If No, reason:  Signed: ZIMMERMAN,DONIELLE M PA-C 03/01/2016, 10:07 AM

## 2016-02-22 NOTE — Progress Notes (Signed)
Patient has arrived on unit from 2S accompanied by RN in wheelchair. Patient oriented to unit, assessed, placed on tele, VS were stable, besides O2 at 89% on RA, gave 2LNC.

## 2016-02-22 NOTE — Care Management Note (Signed)
Case Management Note Donn PieriniKristi Dereona Kolodny RN, BSN Unit 2W-Case Manager [phone]  Patient Details  Name: Kelsey Hansen MRN: [id number] Date of Birth: Feb 29, 1966  Subjective/Objective:   Pt s/p CABGx3-  tx from 2S to 2W on 02/22/16- post op day 2                Action/Plan: PTA pt lived at home- was independent- anticipate return home- CM to follow.   Expected Discharge Date:                  Expected Discharge Plan:  Home/Self Care  In-House Referral:     Discharge planning Services  CM Consult  Post Acute Care Choice:    Choice offered to:     DME Arranged:    DME Agency:     HH Arranged:    HH Agency:     Status of Service:  In process, will continue to follow  If discussed at Long Length of Stay Meetings, dates discussed:    Additional Comments:  Darrold SpanWebster, Ketsia Linebaugh Hall, RN 02/22/2016, 4:07 PM

## 2016-02-22 NOTE — Progress Notes (Signed)
2 Days Post-Op Procedure(s) (LRB): CORONARY ARTERY BYPASS GRAFTING (CABG)x three,  using left internal mammary artery and right greater saphenous leg vein using endoscope. (N/A) TRANSESOPHAGEAL ECHOCARDIOGRAM (TEE) (N/A) Subjective: Progressing after CABG On chronic plavix for PVD COPD weaning O2 Non diabetic nsr so far postop Objective: Vital signs in last 24 hours: Temp:  [98.2 F (36.8 C)-99.7 F (37.6 C)] 99.2 F (37.3 C) (09/08 0405) Pulse Rate:  [57-100] 72 (09/08 0800) Cardiac Rhythm: Normal sinus rhythm (09/08 0755) Resp:  [8-32] 13 (09/08 0800) BP: (107-159)/(60-83) 115/66 (09/08 0800) SpO2:  [92 %-100 %] 98 % (09/08 0800) Weight:  [153 lb (69.4 kg)] 153 lb (69.4 kg) (09/08 0500)  Hemodynamic parameters for last 24 hours:  stable  Intake/Output from previous day: 09/07 0701 - 09/08 0700 In: 583.9 [P.O.:300; I.V.:83.9; IV Piggyback:200] Out: 1660 [Urine:1560; Chest Tube:100] Intake/Output this shift: No intake/output data recorded.       Exam    General- alert and comfortable   Lungs- clear without rales, wheezes   Cor- regular rate and rhythm, no murmur , gallop   Abdomen- soft, non-tender   Extremities - warm, non-tender, minimal edema   Neuro- oriented, appropriate, no focal weakness   Lab Results:  Recent Labs  02/21/16 1700 02/21/16 1720 02/22/16 0359  WBC 18.6*  --  19.9*  HGB 8.7* 9.2* 8.6*  HCT 27.4* 27.0* 26.5*  PLT 187  --  179   BMET:  Recent Labs  02/21/16 0349  02/21/16 1720 02/22/16 0359  NA 139  --  138 136  K 4.0  --  4.5 4.1  CL 108  --  99* 103  CO2 25  --   --  26  GLUCOSE 129*  --  157* 116*  BUN 6  --  8 9  CREATININE 0.58  < > 0.80 0.71  CALCIUM 8.1*  --   --  8.6*  < > = values in this interval not displayed.  PT/INR:  Recent Labs  02/20/16 1412  LABPROT 17.8*  INR 1.45   ABG    Component Value Date/Time   PHART 7.309 (L) 02/20/2016 2309   HCO3 24.9 02/20/2016 2309   TCO2 27 02/21/2016 1720   ACIDBASEDEF  2.0 02/20/2016 2309   O2SAT 96.0 02/20/2016 2309   CBG (last 3)   Recent Labs  02/22/16 0010 02/22/16 0400 02/22/16 0909  GLUCAP 148* 120* 130*    Assessment/Plan: S/P Procedure(s) (LRB): CORONARY ARTERY BYPASS GRAFTING (CABG)x three,  using left internal mammary artery and right greater saphenous leg vein using endoscope. (N/A) TRANSESOPHAGEAL ECHOCARDIOGRAM (TEE) (N/A) tx to stepdown Follow elevated WBC - no fever   LOS: 7 days    Kathlee Nationseter Van Trigt III 02/22/2016

## 2016-02-22 NOTE — Progress Notes (Signed)
1415 Offered to walk with pt who is resting. Has walked twice already. Stated will walk later this evening with staff. Will follow up tomorrow. Luetta Nuttingharlene Candace Begue RN BSN 02/22/2016 2:16 PM

## 2016-02-23 ENCOUNTER — Inpatient Hospital Stay (HOSPITAL_COMMUNITY): Payer: Medicare Other

## 2016-02-23 LAB — TYPE AND SCREEN
ABO/RH(D): A POS
Antibody Screen: NEGATIVE
Unit division: 0
Unit division: 0

## 2016-02-23 LAB — BASIC METABOLIC PANEL
Anion gap: 8 (ref 5–15)
BUN: 14 mg/dL (ref 6–20)
CO2: 30 mmol/L (ref 22–32)
Calcium: 8.7 mg/dL — ABNORMAL LOW (ref 8.9–10.3)
Chloride: 100 mmol/L — ABNORMAL LOW (ref 101–111)
Creatinine, Ser: 0.58 mg/dL (ref 0.44–1.00)
GFR calc Af Amer: >60 mL/min (ref >60)
GFR calc non Af Amer: >60 mL/min (ref >60)
Glucose, Bld: 101 mg/dL — ABNORMAL HIGH (ref 65–99)
Potassium: 3.8 mmol/L (ref 3.5–5.1)
Sodium: 138 mmol/L (ref 135–145)

## 2016-02-23 LAB — CBC
HCT: 23.7 % — ABNORMAL LOW (ref 36.0–46.0)
Hemoglobin: 8 g/dL — ABNORMAL LOW (ref 12.0–15.0)
MCH: 31.9 pg (ref 26.0–34.0)
MCHC: 33.8 g/dL (ref 30.0–36.0)
MCV: 94.4 fL (ref 78.0–100.0)
Platelets: 186 10*3/uL (ref 150–400)
RBC: 2.51 MIL/uL — ABNORMAL LOW (ref 3.87–5.11)
RDW: 12.4 % (ref 11.5–15.5)
WBC: 18.4 10*3/uL — ABNORMAL HIGH (ref 4.0–10.5)

## 2016-02-23 MED ORDER — DICLOFENAC SODIUM 1 % TD GEL
2.0000 g | Freq: Four times a day (QID) | TRANSDERMAL | Status: DC
  Administered 2016-02-23 – 2016-02-29 (×21): 2 g via TOPICAL
  Filled 2016-02-23: qty 100

## 2016-02-23 MED ORDER — METOPROLOL TARTRATE 12.5 MG HALF TABLET
12.5000 mg | ORAL_TABLET | Freq: Once | ORAL | Status: AC
  Administered 2016-02-23: 12.5 mg via ORAL

## 2016-02-23 NOTE — Progress Notes (Signed)
CARDIAC REHAB PHASE I   PRE:  Rate/Rhythm: SR 78  BP:  Supine:   Sitting: 107/58  Standing:    SaO2: 90 2lnc inc to 3Lnc for ambulation  MODE:  Ambulation: 350 ft   POST:  Rate/Rhythm: ST 110  BP:  Supine:   Sitting: 110/66  Standing:    SaO2: 87% inc 90 3LNC left on 3LNC   Pt ambulated 350 ft x 1 assist and RW.  Pt needed to cut ambulation short due to feeling nauseated.  Pt back to room and placed in bed.  Pt felt unable to sit in chair.  Pt fanned which helped the nausea.  Pt given gingerale to drink.  Pt O2 sat upon returning to room was 87%  Able to increase to 90% with good purse lip breathing.  02 left at 3lnc.  Pt used incentive spirometer to 750 x 10 with productive cough.  Encouraged pt to continue to increase pulmonary toliet with IS, TCDB and increase activity with assistance.  Pt verbalized understanding and demonstrated appropriately. Alanson Alyarlette Tamerra Merkley RN, BSN [phone]

## 2016-02-23 NOTE — Progress Notes (Addendum)
[address]       [place] [address]             [phone]      3 Days Post-Op Procedure(s) (LRB): CORONARY ARTERY BYPASS GRAFTING (CABG)x three,  using left internal mammary artery and right greater saphenous leg vein using endoscope. (N/A) TRANSESOPHAGEAL ECHOCARDIOGRAM (TEE) (N/A) Subjective: Feels fair, no specific issues  Objective: Vital signs in last 24 hours: Temp:  [98.5 F (36.9 C)-99.8 F (37.7 C)] 98.5 F (36.9 C) (09/09 0454) Pulse Rate:  [62-88] 73 (09/09 0454) Cardiac Rhythm: Normal sinus rhythm (09/09 0700) Resp:  [12-18] 18 (09/09 0454) BP: (76-141)/(43-70) 76/43 (09/09 0454) SpO2:  [91 %-98 %] 95 % (09/09 0831) Weight:  [148 lb 6.4 oz (67.3 kg)] 148 lb 6.4 oz (67.3 kg) (09/09 0454)  Hemodynamic parameters for last 24 hours:    Intake/Output from previous day: 09/08 0701 - 09/09 0700 In: 240 [P.O.:240] Out: 1050 [Urine:1050] Intake/Output this shift: No intake/output data recorded.  General appearance: alert, cooperative and no distress Heart: regular rate and rhythm Lungs: clear to auscultation bilaterally Abdomen: benign Extremities: Minor edema Wound: EVH site healing well, chest dressing in place  Lab Results:  Recent Labs  02/22/16 0359 02/23/16 0416  WBC 19.9* 18.4*  HGB 8.6* 8.0*  HCT 26.5* 23.7*  PLT 179 186   BMET:  Recent Labs  02/22/16 0359 02/23/16 0416  NA 136 138  K 4.1 3.8  CL 103 100*  CO2 26 30  GLUCOSE 116* 101*  BUN 9 14  CREATININE 0.71 0.58  CALCIUM 8.6* 8.7*    PT/INR:  Recent Labs  02/20/16 1412  LABPROT 17.8*  INR 1.45   ABG    Component Value Date/Time   PHART 7.309 (L) 02/20/2016 2309   HCO3 24.9 02/20/2016 2309   TCO2 27 02/21/2016 1720   ACIDBASEDEF 2.0 02/20/2016 2309   O2SAT 96.0 02/20/2016 2309   CBG (last 3)   Recent Labs  02/22/16 0010 02/22/16 0400 02/22/16 0909  GLUCAP 148* 120* 130*    Meds Scheduled Meds: . acetaminophen  1,000 mg Oral Q6H   Or  . acetaminophen (TYLENOL) oral liquid 160 mg/5 mL  1,000 mg Per Tube Q6H  . aspirin EC  325 mg Oral Daily   Or  . aspirin  324 mg Per Tube Daily  . bisacodyl  10 mg Oral Daily   Or  . bisacodyl  10 mg Rectal Daily  . chlorhexidine  15 mL Mouth Rinse BID  . clopidogrel  75 mg Oral Daily  . docusate sodium  200 mg Oral Daily  . furosemide  40 mg Oral Daily  . levalbuterol  1.25 mg Nebulization TID  . metoCLOPramide (REGLAN) injection  10 mg Intravenous Q6H  . metoprolol tartrate  25 mg Oral BID   Or  . metoprolol tartrate  12.5 mg Per Tube BID  . mometasone-formoterol  2 puff Inhalation BID  . pantoprazole  40 mg Oral Daily  . potassium chloride  20 mEq Oral Daily  . sodium chloride flush  3 mL Intravenous Q12H  . sodium chloride flush  3 mL Intravenous Q12H   Continuous Infusions: . sodium chloride Stopped (02/20/16 2000)   PRN Meds:.sodium chloride, ALPRAZolam, fentaNYL (SUBLIMAZE) injection, guaiFENesin, levalbuterol, magnesium hydroxide, metoprolol, ondansetron (ZOFRAN) IV, oxyCODONE, sodium chloride flush, traMADol  Xrays Dg Chest Port 1 View  Result Date: 02/22/2016 CLINICAL DATA:  CABG. EXAM: PORTABLE CHEST 1 VIEW COMPARISON:  02/21/2016 .  FINDINGS: Interim removal Swan-Ganz catheter, mediastinal drainage catheter, left chest tube. Right IJ sheath in stable position. Prior CABG. Stable cardiomegaly. Low lung volumes with bibasilar atelectasis and/or infiltrates again noted. Small bilateral pleural effusions noted. No pneumothorax . IMPRESSION: 1. Interim removal of Swan-Ganz catheter, mediastinal drainage catheter, left chest tube . Right IJ sheath in stable position. 2.  Prior CABG.  Mild cardiomegaly.  No pulmonary venous congestion. 3. Bibasilar atelectasis and/or infiltrates again noted. Small bilateral pleural effusions noted on today's exam. Electronically Signed   By: Maisie Fushomas  Register   On: 02/22/2016 08:00    Assessment/Plan: S/P Procedure(s) (LRB): CORONARY ARTERY  BYPASS GRAFTING (CABG)x three,  using left internal mammary artery and right greater saphenous leg vein using endoscope. (N/A) TRANSESOPHAGEAL ECHOCARDIOGRAM (TEE) (N/A)  1 hemodyn stable but BP variable. Maintaining sinus rhythm. No ACE-I yet 2 cont diuresis for mild volume overload 3 H/H has dropped further- monitor closely, may benefit from transfuson if unable to get off O2 - hold for now 4 leukocytosis slightly improved 5 sugars elevated mildly. Diet can control hopefully, HgB A1C 5.9 preop 6 pulm toilet/nebs for COPD- wean O2 as able 7 renal fxn is normal 8 on plavix and ASA- continue  LOS: 8 days    GOLD,WAYNE E 02/23/2016  I have seen and examined the patient and agree with the assessment and plan as outlined.  Making good progress.  Anticipate possible d/c home 2-3 days.  Purcell Nailslarence H Ignace Mandigo, MD 02/23/2016 11:16 AM

## 2016-02-23 NOTE — Progress Notes (Signed)
Pt run two episodes of SVT-ST, her BP is also low at 85/50. Dr Sharlene Mottsven notified, and advises not to hold the lopressor but give 12.5 mg

## 2016-02-24 LAB — CBC WITH DIFFERENTIAL/PLATELET
Basophils Absolute: 0 10*3/uL (ref 0.0–0.1)
Basophils Relative: 0 %
EOS ABS: 0.1 10*3/uL (ref 0.0–0.7)
Eosinophils Relative: 1 %
HCT: 23.1 % — ABNORMAL LOW (ref 36.0–46.0)
HEMOGLOBIN: 7.5 g/dL — AB (ref 12.0–15.0)
Lymphocytes Relative: 23 %
Lymphs Abs: 2.6 10*3/uL (ref 0.7–4.0)
MCH: 31.1 pg (ref 26.0–34.0)
MCHC: 32.5 g/dL (ref 30.0–36.0)
MCV: 95.9 fL (ref 78.0–100.0)
Monocytes Absolute: 1.2 10*3/uL — ABNORMAL HIGH (ref 0.1–1.0)
Monocytes Relative: 11 %
NEUTROS PCT: 65 %
Neutro Abs: 7.3 10*3/uL (ref 1.7–7.7)
Platelets: 238 10*3/uL (ref 150–400)
RBC: 2.41 MIL/uL — AB (ref 3.87–5.11)
RDW: 12.1 % (ref 11.5–15.5)
WBC: 11.3 10*3/uL — AB (ref 4.0–10.5)

## 2016-02-24 NOTE — Progress Notes (Addendum)
[address]       [place] [id number]             [phone]      4 Days Post-Op Procedure(s) (LRB): CORONARY ARTERY BYPASS GRAFTING (CABG)x three,  using left internal mammary artery and right greater saphenous leg vein using endoscope. (N/A) TRANSESOPHAGEAL ECHOCARDIOGRAM (TEE) (N/A) Subjective: Feeling a bit better  Objective: Vital signs in last 24 hours: Temp:  [98.6 F (37 C)-99.4 F (37.4 C)] 98.6 F (37 C) (09/10 0434) Pulse Rate:  [78-98] 82 (09/10 0434) Cardiac Rhythm: Normal sinus rhythm (09/10 0700) Resp:  [15-16] 16 (09/09 2052) BP: (85-129)/(41-62) 90/51 (09/10 0436) SpO2:  [91 %-96 %] 91 % (09/10 0434) Weight:  [146 lb 1.6 oz (66.3 kg)] 146 lb 1.6 oz (66.3 kg) (09/10 0436)  Hemodynamic parameters for last 24 hours:    Intake/Output from previous day: 09/09 0701 - 09/10 0700 In: 520 [P.O.:520] Out: 300 [Urine:300] Intake/Output this shift: No intake/output data recorded.  General appearance: alert, cooperative and no distress Heart: regular rate and rhythm Lungs: clear to auscultation bilaterally Abdomen: benign Extremities: no edema Wound: incis healing well  Lab Results:  Recent Labs  02/23/16 0416 02/24/16 0417  WBC 18.4* 11.3*  HGB 8.0* 7.5*  HCT 23.7* 23.1*  PLT 186 238   BMET:  Recent Labs  02/22/16 0359 02/23/16 0416  NA 136 138  K 4.1 3.8  CL 103 100*  CO2 26 30  GLUCOSE 116* 101*  BUN 9 14  CREATININE 0.71 0.58  CALCIUM 8.6* 8.7*    PT/INR: No results for input(s): LABPROT, INR in the last 72 hours. ABG    Component Value Date/Time   PHART 7.309 (L) 02/20/2016 2309   HCO3 24.9 02/20/2016 2309   TCO2 27 02/21/2016 1720   ACIDBASEDEF 2.0 02/20/2016 2309   O2SAT 96.0 02/20/2016 2309   CBG (last 3)   Recent Labs  02/22/16 0010 02/22/16 0400 02/22/16 0909  GLUCAP 148* 120* 130*    Meds Scheduled Meds: . acetaminophen  1,000 mg Oral Q6H  . aspirin EC  325 mg Oral Daily  . bisacodyl   10 mg Oral Daily   Or  . bisacodyl  10 mg Rectal Daily  . chlorhexidine  15 mL Mouth Rinse BID  . clopidogrel  75 mg Oral Daily  . diclofenac sodium  2 g Topical QID  . docusate sodium  200 mg Oral Daily  . furosemide  40 mg Oral Daily  . levalbuterol  1.25 mg Nebulization TID  . metoCLOPramide (REGLAN) injection  10 mg Intravenous Q6H  . metoprolol tartrate  25 mg Oral BID  . mometasone-formoterol  2 puff Inhalation BID  . pantoprazole  40 mg Oral Daily  . potassium chloride  20 mEq Oral Daily  . sodium chloride flush  3 mL Intravenous Q12H  . sodium chloride flush  3 mL Intravenous Q12H   Continuous Infusions: . sodium chloride Stopped (02/20/16 2000)   PRN Meds:.sodium chloride, ALPRAZolam, fentaNYL (SUBLIMAZE) injection, guaiFENesin, levalbuterol, magnesium hydroxide, metoprolol, ondansetron (ZOFRAN) IV, oxyCODONE, sodium chloride flush, traMADol  Xrays Dg Chest 2 View  Result Date: 02/23/2016 CLINICAL DATA:  Cough. EXAM: CHEST  2 VIEW COMPARISON:  Radiograph of February 22, 2016. FINDINGS: Stable cardiomediastinal silhouette. Status post coronary bypass graft. Stable bibasilar edema or atelectasis is noted with associated pleural effusions. No pneumothorax is noted. Atherosclerosis of thoracic aorta is noted. IMPRESSION: Stable bibasilar subsegmental atelectasis or edema with associated  pleural effusions. Aortic atherosclerosis. Electronically Signed   By: Lupita Raider, M.D.   On: 02/23/2016 10:07    Assessment/Plan: S/P Procedure(s) (LRB): CORONARY ARTERY BYPASS GRAFTING (CABG)x three,  using left internal mammary artery and right greater saphenous leg vein using endoscope. (N/A) TRANSESOPHAGEAL ECHOCARDIOGRAM (TEE) (N/A)  1 some episodes of SVT, currently sinus in 60's- cont beta blocker. D/C reglan 2 BP too low for ACE-I 3 anemia fairly stable- trying to avoid transfusion if possible  4 cont pulm rx- wean O2 5 cont gentle diuresis for small pleural effusions but is  approaching euvolemia 6 leukocytosis is significantly improved 7 push rehab as able   LOS: 9 days    GOLD,WAYNE E 02/24/2016  I have seen and examined the patient and agree with the assessment and plan as outlined.  Feels better today.  Purcell Nails, MD 02/24/2016 11:17 AM

## 2016-02-25 LAB — CBC
HCT: 25.9 % — ABNORMAL LOW (ref 36.0–46.0)
Hemoglobin: 8.3 g/dL — ABNORMAL LOW (ref 12.0–15.0)
MCH: 30.6 pg (ref 26.0–34.0)
MCHC: 32 g/dL (ref 30.0–36.0)
MCV: 95.6 fL (ref 78.0–100.0)
Platelets: 382 10*3/uL (ref 150–400)
RBC: 2.71 MIL/uL — ABNORMAL LOW (ref 3.87–5.11)
RDW: 12.3 % (ref 11.5–15.5)
WBC: 10.3 10*3/uL (ref 4.0–10.5)

## 2016-02-25 MED ORDER — GUAIFENESIN ER 600 MG PO TB12
600.0000 mg | ORAL_TABLET | Freq: Two times a day (BID) | ORAL | Status: DC
  Administered 2016-02-25 – 2016-03-01 (×10): 600 mg via ORAL
  Filled 2016-02-25 (×10): qty 1

## 2016-02-25 MED ORDER — CEPHALEXIN 500 MG PO CAPS
500.0000 mg | ORAL_CAPSULE | Freq: Three times a day (TID) | ORAL | Status: DC
  Administered 2016-02-26 – 2016-02-29 (×10): 500 mg via ORAL
  Filled 2016-02-25 (×11): qty 1

## 2016-02-25 MED ORDER — SORBITOL 70 % PO SOLN
30.0000 mL | Freq: Once | ORAL | Status: AC
  Administered 2016-02-25: 30 mL via ORAL
  Filled 2016-02-25: qty 30

## 2016-02-25 MED ORDER — LEVALBUTEROL HCL 1.25 MG/0.5ML IN NEBU
1.2500 mg | INHALATION_SOLUTION | Freq: Two times a day (BID) | RESPIRATORY_TRACT | Status: DC
  Administered 2016-02-25 – 2016-02-29 (×9): 1.25 mg via RESPIRATORY_TRACT
  Filled 2016-02-25 (×10): qty 0.5

## 2016-02-25 NOTE — Progress Notes (Signed)
CARDIAC REHAB PHASE I   PRE:  Rate/Rhythm: 61 SR  BP:  Supine: 118/58  Sitting:   Standing:    SaO2: 96% 2.5L  MODE:  Ambulation: 550 ft   POST:  Rate/Rhythm: 103  BP:  Supine:   Sitting: 120/60  Standing:    SaO2: 89-91%RA  Mainly 91%  Left off oxygen 0958-1020 Pt not happy and stated she is going home tomorrow regardless and not on oxygen. Stated cannot get any rest. Walked pt off oxygen and monitored sats whole walk and she did not require to be put back on. Notified RN that I was leaving pt off oxygen. To bathroom and then to bed for pacing wire removal. Walked 550 ft on RA with hand held asst. Gait steady. Encouraged IS.   Kelsey Nuttingharlene Keaston Pile, RN BSN  02/25/2016 10:17 AM

## 2016-02-25 NOTE — Progress Notes (Addendum)
[address]       [place] [address]             [phone]      5 Days Post-Op Procedure(s) (LRB): CORONARY ARTERY BYPASS GRAFTING (CABG)x three,  using left internal mammary artery and right greater saphenous leg vein using endoscope. (N/A) TRANSESOPHAGEAL ECHOCARDIOGRAM (TEE) (N/A) Subjective: Feels pretty well, still requiring supplemental O2, low grade temp  Objective: Vital signs in last 24 hours: Temp:  [97.9 F (36.6 C)-100 F (37.8 C)] 97.9 F (36.6 C) (09/11 0433) Pulse Rate:  [67-80] 67 (09/11 0433) Cardiac Rhythm: Normal sinus rhythm (09/11 0700) Resp:  [17-22] 20 (09/11 0433) BP: (98-101)/(49-57) 98/57 (09/11 0433) SpO2:  [91 %-97 %] 97 % (09/11 0433) Weight:  [143 lb 9.6 oz (65.1 kg)] 143 lb 9.6 oz (65.1 kg) (09/11 0433)  Hemodynamic parameters for last 24 hours:    Intake/Output from previous day: 09/10 0701 - 09/11 0700 In: 100 [P.O.:100] Out: 0  Intake/Output this shift: No intake/output data recorded.  General appearance: alert, cooperative and no distress Heart: regular rate and rhythm Lungs: clear to auscultation bilaterally Abdomen: benign Extremities: trace edema Wound: incis healing well  Lab Results:  Recent Labs  02/23/16 0416 02/24/16 0417  WBC 18.4* 11.3*  HGB 8.0* 7.5*  HCT 23.7* 23.1*  PLT 186 238   BMET:  Recent Labs  02/23/16 0416  NA 138  K 3.8  CL 100*  CO2 30  GLUCOSE 101*  BUN 14  CREATININE 0.58  CALCIUM 8.7*    PT/INR: No results for input(s): LABPROT, INR in the last 72 hours. ABG    Component Value Date/Time   PHART 7.309 (L) 02/20/2016 2309   HCO3 24.9 02/20/2016 2309   TCO2 27 02/21/2016 1720   ACIDBASEDEF 2.0 02/20/2016 2309   O2SAT 96.0 02/20/2016 2309   CBG (last 3)   Recent Labs  02/22/16 0909  GLUCAP 130*    Meds Scheduled Meds: . acetaminophen  1,000 mg Oral Q6H  . aspirin EC  325 mg Oral Daily  . bisacodyl  10 mg Oral Daily   Or  . bisacodyl  10 mg  Rectal Daily  . chlorhexidine  15 mL Mouth Rinse BID  . clopidogrel  75 mg Oral Daily  . diclofenac sodium  2 g Topical QID  . docusate sodium  200 mg Oral Daily  . furosemide  40 mg Oral Daily  . levalbuterol  1.25 mg Nebulization TID  . metoprolol tartrate  25 mg Oral BID  . mometasone-formoterol  2 puff Inhalation BID  . pantoprazole  40 mg Oral Daily  . potassium chloride  20 mEq Oral Daily  . sodium chloride flush  3 mL Intravenous Q12H  . sodium chloride flush  3 mL Intravenous Q12H   Continuous Infusions: . sodium chloride Stopped (02/20/16 2000)   PRN Meds:.sodium chloride, ALPRAZolam, fentaNYL (SUBLIMAZE) injection, guaiFENesin, levalbuterol, magnesium hydroxide, metoprolol, ondansetron (ZOFRAN) IV, oxyCODONE, sodium chloride flush, traMADol  Xrays No results found.  Assessment/Plan: S/P Procedure(s) (LRB): CORONARY ARTERY BYPASS GRAFTING (CABG)x three,  using left internal mammary artery and right greater saphenous leg vein using endoscope. (N/A) TRANSESOPHAGEAL ECHOCARDIOGRAM (TEE) (N/A)  1 steady progress 2 attempt to wean off O2 today - may need short term home O2 3 no new labs , recheck H/H tomorrow- may still need transfusion but trying to avoid 4 sinus rhythm 5 BP too low for ACE-I 6 d/c wires today 7 monitor temp  curve      LOS: 10 days    GOLD,WAYNE E 02/25/2016  We'll set up patient for short-term oxygen if she is not weaned off by tomorrow Should be ready for discharge 9-12 Patient examined, agree with above note Kathlee NationsPeter Van trigt M.D.

## 2016-02-25 NOTE — Progress Notes (Signed)
Patient lying in bed, anxiety level lower. Refused pain medications. No other needs at this time. Call light within reach.

## 2016-02-25 NOTE — Care Management Important Message (Signed)
Important Message  Patient Details  Name: Kelsey Hansen MRN: [id number] Date of Birth: 04-28-1966   Medicare Important Message Given:  Yes    Seldon Barrell Stefan ChurchBratton 02/25/2016, 1:31 PM

## 2016-02-25 NOTE — Progress Notes (Signed)
Removed epicardial pacing wires per MD order. 2 sutures removed. Wires intact. Patient tolerated well. Sites cleansed. Sites clean and dry. Placed steri-strips. Vitals WNL. Bed rest for one hour. Will continue to monitor.  Minerva Endsiffany N Reinaldo Helt

## 2016-02-26 MED ORDER — VANCOMYCIN HCL IN DEXTROSE 750-5 MG/150ML-% IV SOLN
750.0000 mg | Freq: Two times a day (BID) | INTRAVENOUS | Status: DC
  Administered 2016-02-26 – 2016-02-29 (×8): 750 mg via INTRAVENOUS
  Filled 2016-02-26 (×10): qty 150

## 2016-02-26 NOTE — Progress Notes (Addendum)
[address]       [place] [address]             [phone]      6 Days Post-Op Procedure(s) (LRB): CORONARY ARTERY BYPASS GRAFTING (CABG)x three,  using left internal mammary artery and right greater saphenous leg vein using endoscope. (N/A) TRANSESOPHAGEAL ECHOCARDIOGRAM (TEE) (N/A) Subjective: Vomiting last pm, feels fine now, no abdominal pain. + sternal drainage.  Objective: Vital signs in last 24 hours: Temp:  [98.3 F (36.8 C)-98.6 F (37 C)] 98.3 F (36.8 C) (09/12 0455) Pulse Rate:  [59-78] 74 (09/12 0455) Cardiac Rhythm: Normal sinus rhythm (09/12 0700) Resp:  [18] 18 (09/12 0455) BP: (98-115)/(51-60) 110/52 (09/12 0455) SpO2:  [90 %-95 %] 95 % (09/12 0455) Weight:  [141 lb (64 kg)] 141 lb (64 kg) (09/12 0455)  Hemodynamic parameters for last 24 hours:    Intake/Output from previous day: 09/11 0701 - 09/12 0700 In: -  Out: 1 [Emesis/NG output:1] Intake/Output this shift: No intake/output data recorded.  General appearance: alert, cooperative and no distress Heart: regular rate and rhythm Lungs: min dim in bases Abdomen: benign Extremities: no edema Wound: incis healing well, moderate nonpurulent sternal drainage, no sternal click or mobility  Lab Results:  Recent Labs  02/24/16 0417 02/25/16 0747  WBC 11.3* 10.3  HGB 7.5* 8.3*  HCT 23.1* 25.9*  PLT 238 382   BMET: No results for input(s): NA, K, CL, CO2, GLUCOSE, BUN, CREATININE, CALCIUM in the last 72 hours.  PT/INR: No results for input(s): LABPROT, INR in the last 72 hours. ABG    Component Value Date/Time   PHART 7.309 (L) 02/20/2016 2309   HCO3 24.9 02/20/2016 2309   TCO2 27 02/21/2016 1720   ACIDBASEDEF 2.0 02/20/2016 2309   O2SAT 96.0 02/20/2016 2309   CBG (last 3)  No results for input(s): GLUCAP in the last 72 hours.  Meds Scheduled Meds: . aspirin EC  325 mg Oral Daily  . bisacodyl  10 mg Oral Daily   Or  . bisacodyl  10 mg Rectal Daily  .  cephALEXin  500 mg Oral Q8H  . chlorhexidine  15 mL Mouth Rinse BID  . clopidogrel  75 mg Oral Daily  . diclofenac sodium  2 g Topical QID  . docusate sodium  200 mg Oral Daily  . furosemide  40 mg Oral Daily  . guaiFENesin  600 mg Oral BID  . levalbuterol  1.25 mg Nebulization BID  . metoprolol tartrate  25 mg Oral BID  . mometasone-formoterol  2 puff Inhalation BID  . pantoprazole  40 mg Oral Daily  . potassium chloride  20 mEq Oral Daily  . sodium chloride flush  3 mL Intravenous Q12H  . sodium chloride flush  3 mL Intravenous Q12H   Continuous Infusions: . sodium chloride Stopped (02/20/16 2000)   PRN Meds:.sodium chloride, ALPRAZolam, fentaNYL (SUBLIMAZE) injection, levalbuterol, magnesium hydroxide, metoprolol, ondansetron (ZOFRAN) IV, oxyCODONE, sodium chloride flush, traMADol  Xrays No results found.  Assessment/Plan: S/P Procedure(s) (LRB): CORONARY ARTERY BYPASS GRAFTING (CABG)x three,  using left internal mammary artery and right greater saphenous leg vein using endoscope. (N/A) TRANSESOPHAGEAL ECHOCARDIOGRAM (TEE) (N/A)  1 nausea and vomiting - resolved, she is avoiding pain meds as may have contributed.  2 still on 2 liters O2- will arrange for home O2 3 sternal drainage, no cellulitis- currently on keflex- remains afebrile without leukocytosis 4 H/H has improved 5 poss home today   LOS:  11 days    GOLD,WAYNE E 02/26/2016  Will keep patient in hospital for wound care and antibiotics - sternal wound drainage, cellulitis Cont pulmonary toilette  patient examined and medical record reviewed,agree with above note. Kathlee Nationseter Van Trigt III 02/26/2016

## 2016-02-26 NOTE — Progress Notes (Signed)
CARDIAC REHAB PHASE I   PRE:  Rate/Rhythm: 82 SR  BP:  Supine: 90/50  Sitting:   Standing:    SaO2: 88% 1L, 90% 2L  MODE:  Ambulation: 500 ft   POST:  Rate/Rhythm: 84 SR  BP:  Supine:   Sitting: 100/55  Standing:    SaO2: 94% 2L 1345-1422 Pt walked 500 ft on 2L with rolling walker with asst x 1. Gait steady. C/o pain behind right knee. Area bruised but soft. Had to walk on 2L but to 1L with rest. To sitting on side of bed after walk.   Kelsey Nuttingharlene Iver Fehrenbach, RN BSN  02/26/2016 2:18 PM

## 2016-02-26 NOTE — Progress Notes (Addendum)
Patient vomited earlier after receiving night medication.  Zofran given, did not want pain meds again unless she asked for them.  Also, dressing changed due to saturation of the gauze. Patient has also had several bowel movements.  Will continue to monitor. Call light within reach

## 2016-02-26 NOTE — Consult Note (Signed)
            [facility]  02/26/2016  Kelsey Hansen 1965-12-08 [id number]  Patient seen at bedside to identify possible discharge needs.  Patient states not having primary care provider to follow-up at this time but has a Cardiologist that she follows up with.   Patient uses [facility] in [place] and manages her medications at home but states does not have anybody to obtain her medications from pharmacy since her doctor told her she is not able to drive for a period of time.   Patient used to drive herself to doctors' appointments but will not be able to drive yet while recovering as informed by her doctor.   Patient agreed to be provided a list of Primary Care Physicians to choose from. Will notify hospital liaison for evaluation and follow-up of her medication, transportation and caregiver needs after discharge. She expressed understanding of need to have a primary care provider to follow-up post discharge in case any needs arise.  Patient letter and contact information provided to patient.        For additional questions please contact:  Karin GoldenLorraine A. Sharonna Vinje, BSN, RN-BC [facility] LPHN PRIMARY CARE Navigator Cell: [phone]

## 2016-02-27 MED ORDER — FE FUMARATE-B12-VIT C-FA-IFC PO CAPS
1.0000 | ORAL_CAPSULE | Freq: Two times a day (BID) | ORAL | Status: DC
Start: 2016-02-27 — End: 2016-03-01
  Administered 2016-02-27 – 2016-03-01 (×6): 1 via ORAL
  Filled 2016-02-27 (×6): qty 1

## 2016-02-27 MED ORDER — METOPROLOL TARTRATE 12.5 MG HALF TABLET
12.5000 mg | ORAL_TABLET | Freq: Two times a day (BID) | ORAL | Status: DC
  Administered 2016-02-27 – 2016-03-01 (×6): 12.5 mg via ORAL
  Filled 2016-02-27 (×7): qty 1

## 2016-02-27 NOTE — Care Management Note (Signed)
Case Management Note Donn PieriniKristi Chai Routh RN, BSN Unit 2W-Case Manager [phone]  Patient Details  Name: Kelsey Hansen MRN: [id number] Date of Birth: 01-20-1966  Subjective/Objective:   Pt s/p CABGx3-  tx from 2S to 2W on 02/22/16- post op day 2                Action/Plan: PTA pt lived at home- was independent- anticipate return home- CM to follow.   Expected Discharge Date:                  Expected Discharge Plan:  Home/Self Care  In-House Referral:     Discharge planning Services  CM Consult  Post Acute Care Choice:  Durable Medical Equipment Choice offered to:  Patient  DME Arranged:  Oxygen DME Agency:     HH Arranged:  NA HH Agency:  NA  Status of Service:  In process, will continue to follow  If discussed at Long Length of Stay Meetings, dates discussed:  9/11, 9/14  Additional Comments:  02/27/16- 1530- Donn PieriniKristi Guynell Kleiber RN, CM- spoke with pt at bedside regarding d/c needs- order has been placed for home 02- pt will need to qualify for home 02- working on weaning 02- per pt she hopes that she will not need the 02 on discharge- asked pt about transportation pt states that she has a friend that can come get her and will be able to stay with her some once home to assist her. She reports that she is able to get her meds but does not have a PCP- she plans to f/u on getting one soon- will give pt some clinics near [place] and also [facility] as resources. Cm to continue to follow for d/c needs  Darrold SpanWebster, Caitland Porchia Hall, RN 02/27/2016, 3:30 PM

## 2016-02-27 NOTE — Progress Notes (Signed)
CARDIAC REHAB PHASE I   PRE:  Rate/Rhythm: 67 SR  BP:  Supine:   Sitting: 110/60  Standing:    SaO2: 90% 1L, 88%RA  MODE:  Ambulation: 310 ft   POST:  Rate/Rhythm: 85 SR  BP:  Supine:   Sitting: 120/80  Standing:    SaO2: 90-91% 1L hallway [phone] Tried RA in room and to 88%RA. Pt walked on 1L with rolling walker. When I stopped pt in hall to check sats, she stated she wanted to go back to room and she did not need the oxygen. Stated she was going home tomorrow and that she was about to cry. Quickly walked back to room and threw oxygen tubing off. Said she did not need it. Asked pt if she wanted to talk and she stated no. Looking at floor. Put oxygen where pt could reach when ready. Checked BP and HR but did not check sats again as I did not want to upset. Pt walked 310 ft. Tired to give emotional support. No DOE or SOB noted off of oxygen.    Kelsey Nuttingharlene Mariusz Jubb, RN BSN  02/27/2016 10:12 AM

## 2016-02-27 NOTE — Progress Notes (Addendum)
[address]       [place] [id number]             [phone]      7 Days Post-Op Procedure(s) (LRB): CORONARY ARTERY BYPASS GRAFTING (CABG)x three,  using left internal mammary artery and right greater saphenous leg vein using endoscope. (N/A) TRANSESOPHAGEAL ECHOCARDIOGRAM (TEE) (N/A) Subjective: Still with minor sternal drainage  Objective: Vital signs in last 24 hours: Temp:  [97.8 F (36.6 C)-98.5 F (36.9 C)] 98.5 F (36.9 C) (09/13 0517) Pulse Rate:  [57-76] 57 (09/13 0517) Cardiac Rhythm: Normal sinus rhythm (09/12 1900) Resp:  [17-18] 18 (09/13 0517) BP: (91-109)/(44-73) 91/54 (09/13 0517) SpO2:  [85 %-95 %] 93 % (09/13 0517) Weight:  [141 lb 6.4 oz (64.1 kg)] 141 lb 6.4 oz (64.1 kg) (09/13 0517)  Hemodynamic parameters for last 24 hours:    Intake/Output from previous day: 09/12 0701 - 09/13 0700 In: 900 [P.O.:600; IV Piggyback:300] Out: 1 [Stool:1] Intake/Output this shift: No intake/output data recorded.  General appearance: alert, cooperative and no distress Heart: regular rate and rhythm Lungs: min dim in left base  Abdomen: benign Extremities: trace edema Wound: incis healing well, minor sternal drainage , minor erethema, no sternal click or movement  Lab Results:  Recent Labs  02/25/16 0747  WBC 10.3  HGB 8.3*  HCT 25.9*  PLT 382   BMET: No results for input(s): NA, K, CL, CO2, GLUCOSE, BUN, CREATININE, CALCIUM in the last 72 hours.  PT/INR: No results for input(s): LABPROT, INR in the last 72 hours. ABG    Component Value Date/Time   PHART 7.309 (L) 02/20/2016 2309   HCO3 24.9 02/20/2016 2309   TCO2 27 02/21/2016 1720   ACIDBASEDEF 2.0 02/20/2016 2309   O2SAT 96.0 02/20/2016 2309   CBG (last 3)  No results for input(s): GLUCAP in the last 72 hours.  Meds Scheduled Meds: . aspirin EC  325 mg Oral Daily  . bisacodyl  10 mg Oral Daily   Or  . bisacodyl  10 mg Rectal Daily  . cephALEXin  500 mg Oral Q8H  .  chlorhexidine  15 mL Mouth Rinse BID  . clopidogrel  75 mg Oral Daily  . diclofenac sodium  2 g Topical QID  . docusate sodium  200 mg Oral Daily  . furosemide  40 mg Oral Daily  . guaiFENesin  600 mg Oral BID  . levalbuterol  1.25 mg Nebulization BID  . metoprolol tartrate  25 mg Oral BID  . mometasone-formoterol  2 puff Inhalation BID  . pantoprazole  40 mg Oral Daily  . potassium chloride  20 mEq Oral Daily  . sodium chloride flush  3 mL Intravenous Q12H  . sodium chloride flush  3 mL Intravenous Q12H  . vancomycin  750 mg Intravenous Q12H   Continuous Infusions: . sodium chloride Stopped (02/20/16 2000)   PRN Meds:.sodium chloride, ALPRAZolam, levalbuterol, magnesium hydroxide, metoprolol, ondansetron (ZOFRAN) IV, oxyCODONE, sodium chloride flush, traMADol  Xrays No results found.  Assessment/Plan: S/P Procedure(s) (LRB): CORONARY ARTERY BYPASS GRAFTING (CABG)x three,  using left internal mammary artery and right greater saphenous leg vein using endoscope. (N/A) TRANSESOPHAGEAL ECHOCARDIOGRAM (TEE) (N/A)  1 stable 2 O2 now at 1 liter, cont pulm rx 3 drainage decreasing on vanco + keflex 4 some sinus brady, will decrease beta blocker dose 5 on asa/plavix   LOS: 12 days    GOLD,WAYNE E 02/27/2016  Patient examined and above note reviewed-agree with  above findings and plan. Pulmonary status is improved and she is now weaned to 1 L nasal cannula The patient's upper sternal drainage is improved, no evidence of fluctuance or deep infection. Continue twice a day dressing changes and IV vancomycin Patient will probably be safe for discharge tomorrow on oral Keflex

## 2016-02-27 NOTE — Care Management Important Message (Signed)
Important Message  Patient Details  Name: Kelsey Hansen MRN: [id number] Date of Birth: 04-25-1966   Medicare Important Message Given:  Yes    Kyla BalzarineShealy, Bitha Fauteux Abena 02/27/2016, 10:57 AM

## 2016-02-28 LAB — CBC WITH DIFFERENTIAL/PLATELET
BASOS ABS: 0.1 10*3/uL (ref 0.0–0.1)
Basophils Relative: 1 %
EOS ABS: 0.4 10*3/uL (ref 0.0–0.7)
Eosinophils Relative: 3 %
HEMATOCRIT: 27.7 % — AB (ref 36.0–46.0)
HEMOGLOBIN: 9.1 g/dL — AB (ref 12.0–15.0)
LYMPHS PCT: 26 %
Lymphs Abs: 3.3 10*3/uL (ref 0.7–4.0)
MCH: 31.6 pg (ref 26.0–34.0)
MCHC: 32.9 g/dL (ref 30.0–36.0)
MCV: 96.2 fL (ref 78.0–100.0)
MONOS PCT: 9 %
Monocytes Absolute: 1.2 10*3/uL — ABNORMAL HIGH (ref 0.1–1.0)
NEUTROS PCT: 61 %
Neutro Abs: 7.8 10*3/uL — ABNORMAL HIGH (ref 1.7–7.7)
Platelets: 565 10*3/uL — ABNORMAL HIGH (ref 150–400)
RBC: 2.88 MIL/uL — AB (ref 3.87–5.11)
RDW: 12.9 % (ref 11.5–15.5)
WBC: 12.8 10*3/uL — AB (ref 4.0–10.5)

## 2016-02-28 LAB — BASIC METABOLIC PANEL
Anion gap: 9 (ref 5–15)
BUN: <5 mg/dL — ABNORMAL LOW (ref 6–20)
CO2: 34 mmol/L — ABNORMAL HIGH (ref 22–32)
Calcium: 9.2 mg/dL (ref 8.9–10.3)
Chloride: 94 mmol/L — ABNORMAL LOW (ref 101–111)
Creatinine, Ser: 0.59 mg/dL (ref 0.44–1.00)
GFR calc Af Amer: >60 mL/min (ref >60)
GFR calc non Af Amer: >60 mL/min (ref >60)
Glucose, Bld: 153 mg/dL — ABNORMAL HIGH (ref 65–99)
Potassium: 3.1 mmol/L — ABNORMAL LOW (ref 3.5–5.1)
Sodium: 137 mmol/L (ref 135–145)

## 2016-02-28 MED ORDER — POTASSIUM CHLORIDE CRYS ER 20 MEQ PO TBCR
20.0000 meq | EXTENDED_RELEASE_TABLET | Freq: Two times a day (BID) | ORAL | Status: DC
  Administered 2016-02-28 – 2016-03-01 (×4): 20 meq via ORAL
  Filled 2016-02-28 (×4): qty 1

## 2016-02-28 MED ORDER — PRAVASTATIN SODIUM 40 MG PO TABS
40.0000 mg | ORAL_TABLET | Freq: Every day | ORAL | Status: DC
  Administered 2016-02-28 – 2016-02-29 (×2): 40 mg via ORAL
  Filled 2016-02-28 (×2): qty 1

## 2016-02-28 NOTE — Progress Notes (Addendum)
[address]       [place] [id number]             [phone]      8 Days Post-Op Procedure(s) (LRB): CORONARY ARTERY BYPASS GRAFTING (CABG)x three,  using left internal mammary artery and right greater saphenous leg vein using endoscope. (N/A) TRANSESOPHAGEAL ECHOCARDIOGRAM (TEE) (N/A) Subjective: Feels fairly well, significantly more drainage from sternal incision  Objective: Vital signs in last 24 hours: Temp:  [97.4 F (36.3 C)-98.5 F (36.9 C)] 98.2 F (36.8 C) (09/14 0451) Pulse Rate:  [61-74] 61 (09/14 0451) Cardiac Rhythm: Normal sinus rhythm;Sinus bradycardia (09/14 0700) Resp:  [17-18] 18 (09/14 0451) BP: (92-102)/(50-65) 92/65 (09/14 0451) SpO2:  [90 %-96 %] 90 % (09/14 0451) Weight:  [140 lb 9.6 oz (63.8 kg)] 140 lb 9.6 oz (63.8 kg) (09/14 0451)  Hemodynamic parameters for last 24 hours:    Intake/Output from previous day: 09/13 0701 - 09/14 0700 In: 600 [P.O.:600] Out: -  Intake/Output this shift: No intake/output data recorded.  General appearance: alert, cooperative and no distress Heart: regular rate and rhythm Lungs: min dim in bases Abdomen: benign Extremities: no edema Wound: incis healing well, but drainage from sternal incis increased  Lab Results:  Recent Labs  02/25/16 0747  WBC 10.3  HGB 8.3*  HCT 25.9*  PLT 382   BMET: No results for input(s): NA, K, CL, CO2, GLUCOSE, BUN, CREATININE, CALCIUM in the last 72 hours.  PT/INR: No results for input(s): LABPROT, INR in the last 72 hours. ABG    Component Value Date/Time   PHART 7.309 (L) 02/20/2016 2309   HCO3 24.9 02/20/2016 2309   TCO2 27 02/21/2016 1720   ACIDBASEDEF 2.0 02/20/2016 2309   O2SAT 96.0 02/20/2016 2309   CBG (last 3)  No results for input(s): GLUCAP in the last 72 hours.  Meds Scheduled Meds: . aspirin EC  325 mg Oral Daily  . bisacodyl  10 mg Oral Daily   Or  . bisacodyl  10 mg Rectal Daily  . cephALEXin  500 mg Oral Q8H  .  chlorhexidine  15 mL Mouth Rinse BID  . clopidogrel  75 mg Oral Daily  . diclofenac sodium  2 g Topical QID  . docusate sodium  200 mg Oral Daily  . ferrous fumarate-b12-vitamic C-folic acid  1 capsule Oral BID PC  . furosemide  40 mg Oral Daily  . guaiFENesin  600 mg Oral BID  . levalbuterol  1.25 mg Nebulization BID  . metoprolol tartrate  12.5 mg Oral BID  . mometasone-formoterol  2 puff Inhalation BID  . pantoprazole  40 mg Oral Daily  . potassium chloride  20 mEq Oral Daily  . sodium chloride flush  3 mL Intravenous Q12H  . sodium chloride flush  3 mL Intravenous Q12H  . vancomycin  750 mg Intravenous Q12H   Continuous Infusions: . sodium chloride Stopped (02/20/16 2000)   PRN Meds:.sodium chloride, ALPRAZolam, levalbuterol, magnesium hydroxide, metoprolol, ondansetron (ZOFRAN) IV, oxyCODONE, sodium chloride flush, traMADol  Xrays No results found.  Assessment/Plan: S/P Procedure(s) (LRB): CORONARY ARTERY BYPASS GRAFTING (CABG)x three,  using left internal mammary artery and right greater saphenous leg vein using endoscope. (N/A) TRANSESOPHAGEAL ECHOCARDIOGRAM (TEE) (N/A)  1 doing well 2 will check wbc count this am, no fevers, on vanv/keflex 3 if home- O2 still at 1 liter, will see if she still needs 4 sinus brady    LOS: 13 days    GOLD,WAYNE  E 02/28/2016  continue to wean O2 and continue wound care and IV vanc until upper sternal wound drainage resolved Patient examined and sternal dressing changed

## 2016-02-28 NOTE — Progress Notes (Addendum)
Patient Name: Kelsey Hansen Date of Encounter: 02/28/2016  Active Problems:   TOBACCO ABUSE   Coronary atherosclerosis of native coronary artery   Essential hypertension, benign   Mixed hyperlipidemia   PVD (peripheral vascular disease) with claudication (HCC)   Unstable angina (HCC)   Coronary artery disease   S/P CABG (coronary artery bypass graft)   Length of Stay: 13  SUBJECTIVE  The patient feels tired, denies chest pain, states that she was intolerant to atorvastatin, simvastatin and rosuvastatin in the past.  CURRENT MEDS . aspirin EC  325 mg Oral Daily  . bisacodyl  10 mg Oral Daily   Or  . bisacodyl  10 mg Rectal Daily  . cephALEXin  500 mg Oral Q8H  . chlorhexidine  15 mL Mouth Rinse BID  . clopidogrel  75 mg Oral Daily  . diclofenac sodium  2 g Topical QID  . docusate sodium  200 mg Oral Daily  . ferrous fumarate-b12-vitamic C-folic acid  1 capsule Oral BID PC  . furosemide  40 mg Oral Daily  . guaiFENesin  600 mg Oral BID  . levalbuterol  1.25 mg Nebulization BID  . metoprolol tartrate  12.5 mg Oral BID  . mometasone-formoterol  2 puff Inhalation BID  . pantoprazole  40 mg Oral Daily  . potassium chloride  20 mEq Oral Daily  . sodium chloride flush  3 mL Intravenous Q12H  . sodium chloride flush  3 mL Intravenous Q12H  . vancomycin  750 mg Intravenous Q12H    OBJECTIVE  Vitals:   02/27/16 1301 02/27/16 2112 02/27/16 2208 02/28/16 0451  BP: (!) 92/50 (!) 102/50  92/65  Pulse: 73 74  61  Resp: 17 18  18   Temp: 98.5 F (36.9 C) 97.4 F (36.3 C)  98.2 F (36.8 C)  TempSrc: Oral Oral  Oral  SpO2: 93% 95% 96% 90%  Weight:    140 lb 9.6 oz (63.8 kg)  Height:        Intake/Output Summary (Last 24 hours) at 02/28/16 0839 Last data filed at 02/27/16 2114  Gross per 24 hour  Intake              480 ml  Output                0 ml  Net              480 ml   Filed Weights   02/26/16 0455 02/27/16 0517 02/28/16 0451  Weight: 141 lb (64 kg) 141 lb  6.4 oz (64.1 kg) 140 lb 9.6 oz (63.8 kg)    PHYSICAL EXAM  General: Pleasant, NAD. Neuro: Alert and oriented X 3. Moves all extremities spontaneously. Psych: Normal affect. HEENT:  Normal  Neck: Supple without bruits or JVD. Lungs:  Resp regular and unlabored, CTA. Heart: RRR no s3, s4, or murmurs. Abdomen: Soft, non-tender, non-distended, BS + x 4.  Extremities: No clubbing, cyanosis or edema. DP/PT/Radials 2+ and equal bilaterally.  Accessory Clinical Findings  Radiology/Studies  Dg Chest 2 View  Result Date: 02/23/2016 CLINICAL DATA:  Cough. EXAM: CHEST  2 VIEW COMPARISON:  Radiograph of February 22, 2016. FINDINGS: Stable cardiomediastinal silhouette. Status post coronary bypass graft. Stable bibasilar edema or atelectasis is noted with associated pleural effusions. No pneumothorax is noted. Atherosclerosis of thoracic aorta is noted. IMPRESSION: Stable bibasilar subsegmental atelectasis or edema with associated pleural effusions. Aortic atherosclerosis. Electronically Signed   By: Lupita RaiderJames  Green Jr, M.D.  On: 02/23/2016 10:07   TELE: SR   ASSESSMENT AND PLAN  1. CAD, s/p CABG, on ASA, Plavix, no statin as previously intolerant, metoprolol, no ACEI/ARB as she has any flank region that he is a very unfortunate as a little hypotension.  2. Hyperlipidemia - intolerant to atorvastatin, rosuvastatin, simvastatin, however known CAD and LDL 167, we will try pravastatin, if intolerant, we will refer to our lipid clinic for PCSK-9 inhibitors consideration.   Kelsey Hansen 02/28/2016    SignedTobias Alexander MD, [facility] 02/28/2016

## 2016-02-28 NOTE — Progress Notes (Signed)
CARDIAC REHAB PHASE I   PRE:  Rate/Rhythm: 79 SR    BP: sitting 120/60    SaO2: 86-89 RA  MODE:  Ambulation: 650 ft   POST:  Rate/Rhythm: 117 ST    BP: sitting 130/70     SaO2: 87-89 RA  Pt initially calm, SaO2 86-89 RA at rest. Had not been wearing O2 for a while. Pt became frustrated very quickly at the fact that I wanted her to walk. Calmed down some in hall walking. No assist, she used IV pole. Denied SOB. SaO2 after walking the same, 87-89 RA. Encouraged more IS and walking. Left off O2. [phone]   Harriet MassonRandi Kristan Laddie Math CES, ACSM 02/28/2016 11:07 AM

## 2016-02-28 NOTE — Consult Note (Signed)
   [facility] CM Inpatient Consult   02/28/2016  Kelsey Hansen May 15, 1966 [id number]   Referral received to assess for care management services.  Spoke with inpatient RNCM on  02/27/16 who states she would work with the patient on discharge planning needs.  Patient had recently transferred to 2W05.  Chart review from inpatient notes 02/27/16 reveals patient states she has friends for transportation and her medications access would not be a problem.  Spoke with inpatient RNCM today and state [place] Management could offer telephonic follow up with the patient's transition back to [place], [place].   Met with the patient at the bedside, flat affect noted, regarding the benefits of [facility] Care Management services for post hospital follow up.  Explained that [place] Management is a covered benefit of Medicare insurancedoes not interfere with or replace any services arranged by the inpatient care management staff.  Patient declined services with [place] [facility]. Patient states, "I can find my own doctor in [place] since it seems that it is important to have a primary care person and I am quite capable of doing that for myself."  Expressed to the patient she would have access to a nurse advice line with these services.  She states, "I don't need that and I can stay with a friend if I need to."  Patient states she had already received [place] Management information.  Thanked her for her time.  Follow up with the inpatient RNCM and encouraged follow up if patient changes her mind at discharge.  For questions,  Natividad Brood, RN BSN [facility] Liaison  [phone] business mobile phone Toll free office [phone]

## 2016-02-29 LAB — BASIC METABOLIC PANEL
Anion gap: 7 (ref 5–15)
BUN: <5 mg/dL — ABNORMAL LOW (ref 6–20)
CO2: 35 mmol/L — ABNORMAL HIGH (ref 22–32)
Calcium: 9.6 mg/dL (ref 8.9–10.3)
Chloride: 94 mmol/L — ABNORMAL LOW (ref 101–111)
Creatinine, Ser: 0.64 mg/dL (ref 0.44–1.00)
GFR calc Af Amer: >60 mL/min (ref >60)
GFR calc non Af Amer: >60 mL/min (ref >60)
Glucose, Bld: 121 mg/dL — ABNORMAL HIGH (ref 65–99)
Potassium: 4 mmol/L (ref 3.5–5.1)
Sodium: 136 mmol/L (ref 135–145)

## 2016-02-29 MED ORDER — DEXTROSE 5 % IV SOLN
1.0000 g | Freq: Two times a day (BID) | INTRAVENOUS | Status: DC
  Administered 2016-02-29 (×2): 1 g via INTRAVENOUS
  Filled 2016-02-29 (×4): qty 1

## 2016-02-29 NOTE — Care Management Important Message (Signed)
Important Message  Patient Details  Name: Kelsey Hansen L Winch MRN: [id number] Date of Birth: [birth year]   Medicare Important Message Given:  Yes    Kyla BalzarineShealy, Zayon Trulson Abena 02/29/2016, 11:25 AM

## 2016-02-29 NOTE — Progress Notes (Addendum)
[address]       [place] [id number]             [phone]   9 Days Post-Op Procedure(s) (LRB): CORONARY ARTERY BYPASS GRAFTING (CABG)x three,  using left internal mammary artery and right greater saphenous leg vein using endoscope. (N/A) TRANSESOPHAGEAL ECHOCARDIOGRAM (TEE) (N/A) Subjective: Feels some chest discomfort  Objective: Vital signs in last 24 hours: Temp:  [98.1 F (36.7 C)-98.4 F (36.9 C)] 98.1 F (36.7 C) (09/15 0502) Pulse Rate:  [76-81] 77 (09/15 0502) Cardiac Rhythm: Normal sinus rhythm;Bundle branch block (09/14 1901) Resp:  [17-18] 18 (09/15 0502) BP: (95-111)/(54-62) 95/60 (09/15 0502) SpO2:  [86 %-95 %] 93 % (09/15 0502) Weight:  [139 lb 8 oz (63.3 kg)] 139 lb 8 oz (63.3 kg) (09/15 0502)  Hemodynamic parameters for last 24 hours:    Intake/Output from previous day: 09/14 0701 - 09/15 0700 In: 720 [P.O.:720] Out: -  Intake/Output this shift: No intake/output data recorded.  General appearance: alert, cooperative and no distress Heart: regular rate and rhythm Lungs: clear to auscultation bilaterally Abdomen: benign Extremities: no edema Wound: + sternal drainage persists, no cellulitis or obvious abscess  Lab Results:  Recent Labs  02/28/16 0916  WBC 12.8*  HGB 9.1*  HCT 27.7*  PLT 565*   BMET:  Recent Labs  02/28/16 0916 02/29/16 0406  NA 137 136  K 3.1* 4.0  CL 94* 94*  CO2 34* 35*  GLUCOSE 153* 121*  BUN <5* <5*  CREATININE 0.59 0.64  CALCIUM 9.2 9.6    PT/INR: No results for input(s): LABPROT, INR in the last 72 hours. ABG    Component Value Date/Time   PHART 7.309 (L) 02/20/2016 2309   HCO3 24.9 02/20/2016 2309   TCO2 27 02/21/2016 1720   ACIDBASEDEF 2.0 02/20/2016 2309   O2SAT 96.0 02/20/2016 2309   CBG (last 3)  No results for input(s): GLUCAP in the last 72 hours.  Meds Scheduled Meds: . aspirin EC  325 mg Oral Daily  . bisacodyl  10 mg Oral Daily   Or  . bisacodyl  10 mg Rectal  Daily  . cephALEXin  500 mg Oral Q8H  . chlorhexidine  15 mL Mouth Rinse BID  . clopidogrel  75 mg Oral Daily  . diclofenac sodium  2 g Topical QID  . docusate sodium  200 mg Oral Daily  . ferrous fumarate-b12-vitamic C-folic acid  1 capsule Oral BID PC  . furosemide  40 mg Oral Daily  . guaiFENesin  600 mg Oral BID  . levalbuterol  1.25 mg Nebulization BID  . metoprolol tartrate  12.5 mg Oral BID  . mometasone-formoterol  2 puff Inhalation BID  . pantoprazole  40 mg Oral Daily  . potassium chloride  20 mEq Oral BID  . pravastatin  40 mg Oral q1800  . sodium chloride flush  3 mL Intravenous Q12H  . sodium chloride flush  3 mL Intravenous Q12H  . vancomycin  750 mg Intravenous Q12H   Continuous Infusions: . sodium chloride Stopped (02/20/16 2000)   PRN Meds:.sodium chloride, ALPRAZolam, levalbuterol, magnesium hydroxide, metoprolol, ondansetron (ZOFRAN) IV, oxyCODONE, sodium chloride flush, traMADol  Xrays No results found.  Assessment/Plan: S/P Procedure(s) (LRB): CORONARY ARTERY BYPASS GRAFTING (CABG)x three,  using left internal mammary artery and right greater saphenous leg vein using endoscope. (N/A) TRANSESOPHAGEAL ECHOCARDIOGRAM (TEE) (N/A)  1 stable exam with continued sternal drainage, WBC is more elevated. Culture pending Gr- rods  on stain, will change keflex to cefipime 2 appears to cont to need O2 3 stable sinus brady  LOS: 14 days    GOLD,WAYNE E 02/29/2016             [phone]                   No sternal click                 Place midline for iv antibiotics which have been held due to iv problems  patient examined and medical record reviewed,agree with above note. Kathlee Nations Trigt III 02/29/2016

## 2016-02-29 NOTE — Progress Notes (Signed)
CARDIAC REHAB PHASE I   PRE:  Rate/Rhythm: 75 SR  BP:  Sitting: 106/62       SaO2: 93% 1L  MODE:  Ambulation: 450 ft   POST:  Rate/Rhythm: 98 SR  BP:  Sitting: 121/68        SaO2: 95% 1L  Pt declined ambulation earlier due to nausea but states it has since improved.  She walked w/o assistance 43850ft on 1L.  No c/o of SOB, tolerated well.  [facility]  Pt to bed per her request, call bell within reach.  Will follow 1445-1510   Ples Spectershley L Niquan Charnley, MS 02/29/2016 3:05 PM

## 2016-03-01 LAB — BASIC METABOLIC PANEL
Anion gap: 8 (ref 5–15)
BUN: 5 mg/dL — ABNORMAL LOW (ref 6–20)
CO2: 33 mmol/L — ABNORMAL HIGH (ref 22–32)
Calcium: 9.2 mg/dL (ref 8.9–10.3)
Chloride: 92 mmol/L — ABNORMAL LOW (ref 101–111)
Creatinine, Ser: 0.63 mg/dL (ref 0.44–1.00)
GFR calc Af Amer: >60 mL/min (ref >60)
GFR calc non Af Amer: >60 mL/min (ref >60)
Glucose, Bld: 108 mg/dL — ABNORMAL HIGH (ref 65–99)
Potassium: 3.7 mmol/L (ref 3.5–5.1)
Sodium: 133 mmol/L — ABNORMAL LOW (ref 135–145)

## 2016-03-01 LAB — CBC
HCT: 27.6 % — ABNORMAL LOW (ref 36.0–46.0)
Hemoglobin: 8.8 g/dL — ABNORMAL LOW (ref 12.0–15.0)
MCH: 30.7 pg (ref 26.0–34.0)
MCHC: 31.9 g/dL (ref 30.0–36.0)
MCV: 96.2 fL (ref 78.0–100.0)
Platelets: 622 10*3/uL — ABNORMAL HIGH (ref 150–400)
RBC: 2.87 MIL/uL — ABNORMAL LOW (ref 3.87–5.11)
RDW: 13.2 % (ref 11.5–15.5)
WBC: 15.3 10*3/uL — ABNORMAL HIGH (ref 4.0–10.5)

## 2016-03-01 LAB — AEROBIC CULTURE W GRAM STAIN (SUPERFICIAL SPECIMEN)
Culture: NO GROWTH
Gram Stain: NONE SEEN
Special Requests: NORMAL

## 2016-03-01 MED ORDER — PRAVASTATIN SODIUM 40 MG PO TABS: 40.0000 mg | ORAL_TABLET | Freq: Every day | ORAL | 1 refills | Status: DC

## 2016-03-01 MED ORDER — CEPHALEXIN 500 MG PO CAPS
500.0000 mg | ORAL_CAPSULE | ORAL | Status: AC
  Administered 2016-03-01: 500 mg via ORAL
  Filled 2016-03-01: qty 1

## 2016-03-01 MED ORDER — ASPIRIN 325 MG PO TBEC: 325.0000 mg | DELAYED_RELEASE_TABLET | Freq: Every day | ORAL | 0 refills | Status: DC

## 2016-03-01 MED ORDER — OXYCODONE HCL 5 MG PO TABS: 5.0000 mg | ORAL_TABLET | ORAL | 0 refills | Status: DC | PRN

## 2016-03-01 MED ORDER — CEPHALEXIN 500 MG PO CAPS: 500.0000 mg | ORAL_CAPSULE | Freq: Four times a day (QID) | ORAL | 0 refills | Status: DC

## 2016-03-01 MED ORDER — METOPROLOL TARTRATE 25 MG PO TABS
12.5000 mg | ORAL_TABLET | Freq: Two times a day (BID) | ORAL | 1 refills | Status: DC
Start: 2016-03-01 — End: 2016-05-13

## 2016-03-01 MED ORDER — CLOPIDOGREL BISULFATE 75 MG PO TABS: 75.0000 mg | ORAL_TABLET | Freq: Once | ORAL | 1 refills | Status: AC

## 2016-03-01 MED ORDER — TRAMADOL HCL 50 MG PO TABS: 50.0000 mg | ORAL_TABLET | ORAL | 0 refills | Status: DC | PRN

## 2016-03-01 MED ORDER — FE FUMARATE-B12-VIT C-FA-IFC PO CAPS: 1.0000 | ORAL_CAPSULE | Freq: Two times a day (BID) | ORAL | 1 refills | Status: DC

## 2016-03-01 MED ORDER — GUAIFENESIN ER 600 MG PO TB12: 600.0000 mg | ORAL_TABLET | Freq: Two times a day (BID) | ORAL | Status: DC | PRN

## 2016-03-01 NOTE — Progress Notes (Signed)
CARDIAC REHAB PHASE I   PRE:  Rate/Rhythm: 66 sinus rhythm    1110-1210  Pt refused ambulation.  Pt states "I am only ready to go home."  Education completed including risk factor modification, low fat-low cholesterol diet, exercise, and medication compliance.  Pt oriented to outpatient cardiac rehab.  At pt request, [place]  Pt encouraged to establish with PCP in her area for primary care.  Pt questions and concerns about primary care physician answered.  pt offered emotional support and reassurance.  Understanding verbalized    CiscoJoann Hamilton Rion

## 2016-03-01 NOTE — Progress Notes (Addendum)
      [address]       [place] [id number]             [phone]        10 Days Post-Op Procedure(s) (LRB): CORONARY ARTERY BYPASS GRAFTING (CABG)x three,  using left internal mammary artery and right greater saphenous leg vein using endoscope. (N/A) TRANSESOPHAGEAL ECHOCARDIOGRAM (TEE) (N/A)  Subjective: Patient frustrated-wants to go home.  Objective: Vital signs in last 24 hours: Temp:  [98.3 F (36.8 C)-98.8 F (37.1 C)] 98.3 F (36.8 C) (09/16 0500) Pulse Rate:  [69-98] 69 (09/16 0500) Cardiac Rhythm: Normal sinus rhythm (09/15 1900) Resp:  [18] 18 (09/16 0500) BP: (90-114)/(57-63) 90/61 (09/16 0500) SpO2:  [95 %-96 %] 96 % (09/16 0500) Weight:  [138 lb 14.2 oz (63 kg)] 138 lb 14.2 oz (63 kg) (09/16 0500)  Pre op weight 65.3 kg Current Weight  03/01/16 138 lb 14.2 oz (63 kg)      Intake/Output from previous day: 09/15 0701 - 09/16 0700 In: 420 [P.O.:220; IV Piggyback:200] Out: -    Physical Exam:  Cardiovascular: RRR, Pulmonary: Slightly diminished throughout Abdomen: Soft, non tender, bowel sounds present. Extremities: Trace bilateral lower extremity edema. Wounds: SCANT sternal drainage. No cellulitis or sternal instability noted.  Lab Results: CBC: Recent Labs  02/28/16 0916 03/01/16 0249  WBC 12.8* 15.3*  HGB 9.1* 8.8*  HCT 27.7* 27.6*  PLT 565* 622*   BMET:  Recent Labs  02/29/16 0406 03/01/16 0249  NA 136 133*  K 4.0 3.7  CL 94* 92*  CO2 35* 33*  GLUCOSE 121* 108*  BUN <5* 5*  CREATININE 0.64 0.63  CALCIUM 9.6 9.2    PT/INR:  Lab Results  Component Value Date   INR 1.45 02/20/2016   INR 0.96 02/19/2016   INR 0.9 05/17/2007   ABG:  INR: Will add last result for INR, ABG once components are confirmed Will add last 4 CBG results once components are confirmed  Assessment/Plan:  1. CV - SR in the 70's. On Lopressor 12.5 mg bid 2.  Pulmonary - Was on 1 liter of oxygen via [place]. Weaned to room  air.Encourage incentive spirometer. 3. Volume Overload - On Lasix 40 mg daily 4.  Acute blood loss anemia - H and H 8.8 and 27.6. Continue Trinsicon. 5. Leukocytosis-WBC increased from 12,800 to 15,300. Sternal cultures showed rare gram negative rods. On Maxipime and Vanco for sternal drainage. Per Dr. Donata ClayVan Trigt, stop IV antibiotics and start oral Keflex. 6. Supplement potassium 7. Arrange HH and discharge later today  ZIMMERMAN,DONIELLE MPA-C 03/01/2016,7:47 AM  patient examined and medical record reviewed,agree with above note. Kathlee Nationseter Van Trigt III 03/01/2016

## 2016-03-03 ENCOUNTER — Other Ambulatory Visit: Payer: Self-pay | Admitting: *Deleted

## 2016-03-03 ENCOUNTER — Telehealth: Payer: Self-pay | Admitting: *Deleted

## 2016-03-03 DIAGNOSIS — G8918 Other acute postprocedural pain: Secondary | ICD-10-CM

## 2016-03-03 MED ORDER — OXYCODONE HCL 5 MG PO TABS: 5.0000 mg | ORAL_TABLET | ORAL | 0 refills | Status: DC | PRN

## 2016-03-03 NOTE — Telephone Encounter (Signed)
[name] had CABG X 3 on 02/20/16 and was discharged on 03/01/16. She was given Tramadol on discharge, but says this is not taking care of her pain, has even tried two at a time.  She was given OXYCODONE even though it says that it causes n/v. She tolerated it well she says and has requested it. I informed her that a script would be available at our office today and she agreed. Her fiance will pick it up.

## 2016-03-04 NOTE — Progress Notes (Signed)
Cardiology Office Note  Date: 03/05/2016   ID: CHARDONNAY HOLZMANN, DOB 29-Aug-1965, MRN [id number]  PCP: No PCP Per Patient  Primary Cardiologist: Nona Dell, MD   Chief Complaint  Patient presents with  . Hospitalization Follow-up  . Coronary Artery Disease    History of Present Illness: Kelsey Hansen is a 50 y.o. female last seen in late August with increased angina symptoms on medical therapy and history of multivessel CAD status post previous angioplasty of the distal circumflex. She was referred for an exercise Myoview study which showed significant ST segment abnormalities resulting in cancellation of the perfusion imaging and hospitalization for invasive cardiac evaluation. Cardiac catheterization demonstrated multivessel disease as outlined below and the patient was seen by TCTS for discussion of CABG. Operation was on September 6 by Dr. Donata Clay with LIMA to LAD, SVG to ramus, and SVG to RCA. She had no postoperative arrhythmias and was discharged on September 16.  She is here today with a friend. She states that she feels "okay" overall. She has not returned to smoking since hospitalization and seems to be focused on smoking cessation going forward. Appetite has been fair, she has been doing some walking, but has not  progressed all that much since she just recently got home. She has had no fevers or chills. Continues to put gauze on her sternal wound, had had some drainage in the hospital and was placed on Keflex which continues. She sees Dr. Donata Clay in the next few weeks. She is not driving and understands lifting restrictions for now.  I reviewed her medications. Blood pressure on the low side today, but she is asymptomatic. Current cardiac regimen includes aspirin, Plavix, Lopressor, and Pravachol.  She states that she is in the process of establishing with a primary care provider.  Past Medical History:  Diagnosis Date  . Anxiety   . 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 Coronary atherosclerosis of native coronary artery    Multivessel  . Hyperlipidemia   . NSTEMI (non-ST elevated myocardial infarction) (HCC) 11/07   PTCA of distal circumflex 2007, residual 50% LAD and 80% PDA  . Peripheral arterial disease (HCC)    Right common and external iliac stent, right to left fem-fem bypass    Past Surgical History:  Procedure Laterality Date  . CARDIAC CATHETERIZATION  02/15/2016  . CARDIAC CATHETERIZATION N/A 02/15/2016   Procedure: Left Heart Cath and Coronary Angiography;  Surgeon: Lyn Records, MD;  Location: [facility] INVASIVE CV LAB;  Service: Cardiovascular;  Laterality: N/A;  . CORONARY ARTERY BYPASS GRAFT N/A 02/20/2016   Procedure: CORONARY ARTERY BYPASS GRAFTING (CABG)x three,  using left internal mammary artery and right greater saphenous leg vein using endoscope.;  Surgeon: Kerin Perna, MD;  Location: [facility] OR;  Service: Open Heart Surgery;  Laterality: N/A;  . FEMORAL BYPASS  7/08   Right to left; followed by right external iliac artery stent placed 12/08  . PR VEIN BYPASS GRAFT,AORTO-FEM-POP    . SP [place] TRANSCATHETER OCCLUSION     Occlusion off her fem-fem bypass with claudication in both legs  . TEE WITHOUT CARDIOVERSION N/A 02/20/2016   Procedure: TRANSESOPHAGEAL ECHOCARDIOGRAM (TEE);  Surgeon: Kerin Perna, MD;  Location: [facility];  Service: Open Heart Surgery;  Laterality: N/A;    Current Outpatient Prescriptions  Medication Sig Dispense Refill  . ALPRAZolam (XANAX) 0.5 MG tablet Take 0.5 mg by mouth daily as needed.  0  .  aspirin EC 81 MG tablet Take 243 mg by mouth daily.    . cephALEXin (KEFLEX) 500 MG capsule Take 1 capsule (500 mg total) by mouth 4 (four) times daily. 40 capsule 0  . clopidogrel (PLAVIX) 75 MG tablet Take 75 mg by mouth daily.    . ferrous fumarate-b12-vitamic C-folic acid (TRINSICON / FOLTRIN) capsule Take 1 capsule by mouth 2 (two) times daily after a meal. For one  month then stop. 30 capsule 1  . metoprolol tartrate (LOPRESSOR) 25 MG tablet Take 0.5 tablets (12.5 mg total) by mouth 2 (two) times daily. 30 tablet 1  . oxyCODONE (OXY IR/ROXICODONE) 5 MG immediate release tablet Take 1 tablet (5 mg total) by mouth every 4 (four) hours as needed for severe pain. 30 tablet 0  . pravastatin (PRAVACHOL) 40 MG tablet Take 1 tablet (40 mg total) by mouth daily at 6 PM. 30 tablet 1  . traMADol (ULTRAM) 50 MG tablet Take 1 tablet (50 mg total) by mouth every 4 (four) hours as needed for moderate pain. 30 tablet 0  . aspirin EC 325 MG EC tablet Take 1 tablet (325 mg total) by mouth daily. (Patient not taking: Reported on 03/05/2016) 30 tablet 0   No current facility-administered medications for this visit.    Allergies:  Aleve [naproxen]; Crestor [rosuvastatin]; Lipitor [atorvastatin]; Pravachol [pravastatin]; Zocor [simvastatin]; Hydrocodone-acetaminophen; and Oxycodone-acetaminophen   Social History: The patient  reports that she quit smoking about 3 weeks ago. Her smoking use included Cigarettes. She started smoking about 41 years ago. She has a 15.00 pack-year smoking history. She has never used smokeless tobacco. She reports that she does not drink alcohol or use drugs.   ROS:  Please see the history of present illness. Otherwise, complete review of systems is positive for mild swelling right leg with resolving ecchymoses.  All other systems are reviewed and negative.   Physical Exam: VS:  BP 91/64   Pulse 78   Ht 5\' 2"  (1.575 m)   Wt 139 lb (63 kg)   BMI 25.42 kg/m , BMI Body mass index is 25.42 kg/m.  Wt Readings from Last 3 Encounters:  03/05/16 139 lb (63 kg)  03/01/16 138 lb 14.2 oz (63 kg)  02/12/16 148 lb (67.1 kg)    General: Chronically ill-appearing woman in no distress. HEENT: Conjunctiva and lids normal, oropharynx clear. Neck: Supple, no elevated JVP or carotid bruits, no thyromegaly. Thorax: Healing sternal incision with no active  drainage or erythema, few variations in healing closure size. Lungs: Clear to auscultation, nonlabored breathing at rest. Cardiac: Regular rate and rhythm, no S3, no pericardial rub. Abdomen: Soft, nontender, sutured keyhole incisions without erythema, bowel sounds present, no guarding or rebound. Extremities: Mild right lower leg edema with resolving ecchymoses and stable vein harvest site, distal pulses 1-2+. Skin: Warm and dry. Musculoskeletal: No kyphosis. Neuropsychiatric: Alert and oriented x3, affect grossly appropriate.  ECG: I personally reviewed the tracing from 02/21/2016 which showed sinus rhythm with anterolateral ST-T wave abnormalities.  Recent Labwork: 02/16/2016: TSH 4.034 02/19/2016: ALT 16; AST 22 02/21/2016: Magnesium 1.8 03/01/2016: BUN 5; Creatinine, Ser 0.63; Hemoglobin 8.8; Platelets 622; Potassium 3.7; Sodium 133     Component Value Date/Time   CHOL 232 (H) 05/03/2015 1110   TRIG 107 05/03/2015 1110   HDL 44 05/03/2015 1110   CHOLHDL 5.3 05/03/2015 1110   VLDL 21 05/03/2015 1110   LDLCALC 167 (H) 05/03/2015 1110   LDLDIRECT 96.7 10/19/2006 1506    Other Studies Reviewed  Today:  Cardiac catheterization 02/15/2016:  Prox RCA-2 lesion, 75 %stenosed.  Prox RCA-1 lesion, 90 %stenosed.  Mid RCA-2 lesion, 85 %stenosed.  Mid RCA-1 lesion, 80 %stenosed.  Ost Ramus to Ramus lesion, 65 %stenosed.  Prox Cx to Mid Cx lesion, 75 %stenosed.  1st Diag lesion, 50 %stenosed.  Ost LAD lesion, 90 %stenosed.  The left ventricular ejection fraction is 55-65% by visual estimate.  The left ventricular systolic function is normal.  LV end diastolic pressure is normal.    Severe 2 vessel coronary disease involving the right coronary diffusely (relatively small vessel), and significant proximal LAD involvement.  The circumflex contains moderate coronary disease including the site of previous angioplasty in the mid vessel at the time of infarction in 2007. Ramus has  intermediate stenosis.  Normal left ventricular systolic function.  LVEF at least 60% with normal EDP.  Severe peripheral vascular disease with right iliac stents and left femoral-femoral bypass.  Prior right brachial occlusion at the time of catheterization 2007 requiring thrombectomy by Dr. Bud Face.  Echocardiogram 02/16/2016: Study Conclusions  - Left ventricle: The cavity size was normal. Wall thickness was   increased in a pattern of mild LVH. Systolic function was normal.   The estimated ejection fraction was in the range of 60% to 65%.   Wall motion was normal; there were no regional wall motion   abnormalities. Left ventricular diastolic function parameters   were normal. - Aortic valve: Trileaflet. Sclerosis without stenosis. There was   mild regurgitation. - Left atrium: The atrium was normal in size. - Inferior vena cava: The vessel was normal in size. The   respirophasic diameter changes were in the normal range (>= 50%),   consistent with normal central venous pressure.  Impressions:  - LVEF 60-65%, mild LVH, normal wall motion, normal diastolic   function, aortic valve sclerosis with mild AI, normal LA size,   normal IVC.  Assessment and Plan:  1. Multivessel CAD status post CABG on September 6 with Dr. Donata Clay. I am seeing her early postoperative state, she seems to be initiating her recuperation reasonably well. I did discuss with her the possibility of cardiac rehabilitation eventually. For now no driving and lifting restrictions remain in place. She will see Dr. Donata Clay in the next few weeks for chest x-ray. She also continues on Keflex with some sternal wound drainage noted in the hospital. No fevers or chills. I am continuing her present cardiac regimen which we may need to adjust as she gets further out from surgery.  2. Hyperlipidemia, now on Pravachol. We will reassess lipid numbers down the road and further titrate. She has a history of statin  intolerance.  3. Tobacco abuse in remission for now. We discussed this today.  4. History of peripheral arterial disease with prior right common and external iliac stenting as well as right to left femoral-femoral bypass.  5. Health maintenance. Patient states that she is in the process of finding a primary care provider.  Current medicines were reviewed with the patient today.  Disposition: Follow-up with me in 6 weeks.  Signed, Jonelle Sidle, MD, [facility] 03/05/2016 8:26 AM    [facility] at [facility] [address] [place], [place], [place] [address] Phone: [phone]; Fax: [phone]

## 2016-03-05 ENCOUNTER — Ambulatory Visit (INDEPENDENT_AMBULATORY_CARE_PROVIDER_SITE_OTHER): Payer: Medicare Other | Admitting: Cardiology

## 2016-03-05 ENCOUNTER — Encounter: Payer: Self-pay | Admitting: Cardiology

## 2016-03-05 VITALS — BP 91/64 | HR 78 | Ht 62.0 in | Wt 139.0 lb

## 2016-03-05 DIAGNOSIS — E782 Mixed hyperlipidemia: Secondary | ICD-10-CM

## 2016-03-05 DIAGNOSIS — I2510 Atherosclerotic heart disease of native coronary artery without angina pectoris: Secondary | ICD-10-CM | POA: Diagnosis not present

## 2016-03-05 DIAGNOSIS — F17201 Nicotine dependence, unspecified, in remission: Secondary | ICD-10-CM

## 2016-03-05 DIAGNOSIS — I200 Unstable angina: Secondary | ICD-10-CM | POA: Diagnosis not present

## 2016-03-05 DIAGNOSIS — I739 Peripheral vascular disease, unspecified: Secondary | ICD-10-CM

## 2016-03-05 NOTE — Patient Instructions (Signed)
Medication Instructions:  Continue all current medications.  Labwork: none  Testing/Procedures: none  Follow-Up: 6 weeks   Any Other Special Instructions Will Be Listed Below (If Applicable).  If you need a refill on your cardiac medications before your next appointment, please call your pharmacy.  

## 2016-03-12 ENCOUNTER — Other Ambulatory Visit: Payer: Self-pay

## 2016-03-12 NOTE — Patient Outreach (Signed)
I called [name] to discussed mail order pharmacy options.  She was able to verify her PHI.  She stated she has [employer] Part D Plan.  Upon reviewing the plan with her, I was able to determine they have a mail order pharmacy with [facility].  I reviewed the procedure on how to get in contact with them and how she would receive her medications.  She was very interested in following up with them.  She stated she would call them and try to get her medications from them.  She stated she appreciated the information.  Since all of her pharmacy needs have been met, I will close her case to pharmacy.  If further issues arise, I will be happy to assist in the future.   Deanne Coffer, PharmD, [place] [phone]

## 2016-03-13 ENCOUNTER — Other Ambulatory Visit: Payer: Self-pay | Admitting: *Deleted

## 2016-03-13 DIAGNOSIS — F419 Anxiety disorder, unspecified: Secondary | ICD-10-CM

## 2016-03-13 DIAGNOSIS — G8918 Other acute postprocedural pain: Secondary | ICD-10-CM

## 2016-03-13 MED ORDER — OXYCODONE HCL 5 MG PO TABS: 5.0000 mg | ORAL_TABLET | Freq: Four times a day (QID) | ORAL | 0 refills | Status: DC | PRN

## 2016-03-13 MED ORDER — ALPRAZOLAM 0.5 MG PO TABS: 0.5000 mg | ORAL_TABLET | Freq: Every day | ORAL | 0 refills | Status: DC | PRN

## 2016-03-18 ENCOUNTER — Encounter: Payer: Self-pay | Admitting: Vascular Surgery

## 2016-03-19 ENCOUNTER — Ambulatory Visit: Payer: Medicare Other | Admitting: Cardiothoracic Surgery

## 2016-03-20 ENCOUNTER — Ambulatory Visit (INDEPENDENT_AMBULATORY_CARE_PROVIDER_SITE_OTHER): Payer: Medicare Other | Admitting: Vascular Surgery

## 2016-03-20 ENCOUNTER — Other Ambulatory Visit: Payer: Self-pay | Admitting: Cardiothoracic Surgery

## 2016-03-20 ENCOUNTER — Encounter: Payer: Self-pay | Admitting: Vascular Surgery

## 2016-03-20 VITALS — BP 100/60 | HR 70 | Temp 99.3°F | Resp 16 | Ht 62.0 in | Wt 141.0 lb

## 2016-03-20 DIAGNOSIS — I200 Unstable angina: Secondary | ICD-10-CM

## 2016-03-20 DIAGNOSIS — I739 Peripheral vascular disease, unspecified: Secondary | ICD-10-CM | POA: Diagnosis not present

## 2016-03-20 DIAGNOSIS — Z951 Presence of aortocoronary bypass graft: Secondary | ICD-10-CM

## 2016-03-20 DIAGNOSIS — I6523 Occlusion and stenosis of bilateral carotid arteries: Secondary | ICD-10-CM

## 2016-03-20 NOTE — Progress Notes (Signed)
[facility] [place] HISTORY AND PHYSICAL    History of Present Illness:  Patient is a 50 y.o. female who presents for follow-up evaluation for PAD.  She previously had right common iliac stent and right external iliac artery angioplasty by Dr. Madilyn FiremanHayes. This was in 2008. She also had a right to left femoral-femoral bypass at that time. This has been chronically occluded for several years.  She is on Plavix and Aspirin for antiplatelet therapy. Her atherosclerotic risk factors remain elevated cholesterol, smoking, and coronary artery disease.  These are all currently stable and followed by the primary care physician as well as her cardiolgist.  The patient currently has claudication symptoms that occur in both legs left greater than right but she does not really have any significant limitation.   The patient denies rest pain or ulcers on the feet.   She quit smoking just prior to her recent coronary artery bypass grafting.        Past Medical History  Diagnosis Date  . Coronary atherosclerosis of native coronary artery        Multivessel  . NSTEMI (non-ST elevated myocardial infarction) 11/07      PTCA of distal circumflex 2007, residual 50% LAD and 80% PDA  . Brachial artery occlusion        Postcatheterization complication, required surgical repair  . Peripheral arterial disease        Right common and external iliac stent, right to left fem-fem bypass  . Hyperlipidemia    . Anxiety            Past Surgical History  Procedure Laterality Date  . Femoral bypass   7/08      Right to left; followed by right external iliac artery stent placed 12/08  . Sp us transcatheter occlusion          Occlusion off her fem-fem bypass with claudication in both legs  . Pr vein bypass graft,aorto-fem-pop          Review of Systems:   Neurologic: sensation in the feet is intact Cardiac:denies shortness of breath or chest pain Pulmonary: denies cough or wheeze   Social History       History    Substance Use Topics    .   Smoking status:   Current Everyday Smoker -- 0.5 packs/day        Types:   Cigarettes    .   Smokeless tobacco:   Not on file    .   Alcohol Use:          Allergies         Allergies    Allergen   Reactions     .   Hydrocodone-Acetaminophen           REACTION: nausea and vomiting     .   Oxycodone-Acetaminophen           REACTION: nausea and vomiting        Current Outpatient Prescriptions on File Prior to Visit  Medication Sig Dispense Refill  . ALPRAZolam (XANAX) 0.5 MG tablet Take 1 tablet (0.5 mg total) by mouth daily as needed for anxiety. 15 tablet 0  . aspirin EC 325 MG EC tablet Take 1 tablet (325 mg total) by mouth daily. 30 tablet 0  . aspirin EC 81 MG tablet Take 243 mg by mouth daily.    . cephALEXin (KEFLEX) 500 MG capsule Take 1 capsule (500 mg total) by mouth 4 (four) times  daily. 40 capsule 0  . clopidogrel (PLAVIX) 75 MG tablet Take 75 mg by mouth daily.    . ferrous fumarate-b12-vitamic C-folic acid (TRINSICON / FOLTRIN) capsule Take 1 capsule by mouth 2 (two) times daily after a meal. For one month then stop. 30 capsule 1  . metoprolol tartrate (LOPRESSOR) 25 MG tablet Take 0.5 tablets (12.5 mg total) by mouth 2 (two) times daily. 30 tablet 1  . oxyCODONE (OXY IR/ROXICODONE) 5 MG immediate release tablet Take 1 tablet (5 mg total) by mouth every 6 (six) hours as needed for severe pain. 30 tablet 0  . pravastatin (PRAVACHOL) 40 MG tablet Take 1 tablet (40 mg total) by mouth daily at 6 PM. 30 tablet 1  . traMADol (ULTRAM) 50 MG tablet Take 1 tablet (50 mg total) by mouth every 4 (four) hours as needed for moderate pain. 30 tablet 0   No current facility-administered medications on file prior to visit.      Physical Examination    Vitals:   03/20/16 1154 03/20/16 1204  BP: 90/60 100/60  Pulse: 70   Resp: 16   Temp: 99.3 F (37.4 C)   TempSrc: Oral   SpO2: 97%   Weight: 141 lb (64 kg)   Height: 5\' 2"  (1.575 m)       General:  Alert and oriented, no acute distress HEENT: Normal Neck: Left carotid bruit no JVD Pulmonary: Clear to auscultation bilaterally Cardiac: Regular Rate and Rhythm with 3/6 murmur Neurologic: Upper and lower extremity motor 5/5 and symmetric Extremities:  1-2+ right femoral pulse, absent left femoral pulse absent popliteal and pedal pulses   Skin: no ulcer or rash   DATA: ABI was .93, left ABI 0.73. Unchanged from 2016. She also had a carotid duplex scan showed a 40-60% left internal carotid artery stenosis and a less than 40% right internal carotid artery stenosis no change since 2015.   ASSESSMENT: Stable mild claudication in a patient with previous external iliac artery stenting chronically occluded femoral-femoral bypass.  Minimal bilateral carotid disease   PLAN: The patient will follow-up in one year with bilateral ABIs a repeat carotid duplex scan. She'll try to continue to refrain from smoking. She'll continue her antiplatelet agent and statin.     Fabienne Bruns, MD [facility] [place] Office: [phone] Pager: [phone]

## 2016-03-21 ENCOUNTER — Ambulatory Visit
Admission: RE | Admit: 2016-03-21 | Discharge: 2016-03-21 | Disposition: A | Discharge status: Home / Self Care | Payer: Medicare Other | Source: Ambulatory Visit | Attending: Cardiothoracic Surgery | Admitting: Cardiothoracic Surgery

## 2016-03-21 ENCOUNTER — Ambulatory Visit (INDEPENDENT_AMBULATORY_CARE_PROVIDER_SITE_OTHER): Payer: Self-pay | Admitting: Cardiothoracic Surgery

## 2016-03-21 ENCOUNTER — Encounter: Payer: Self-pay | Admitting: Cardiothoracic Surgery

## 2016-03-21 VITALS — BP 104/69 | HR 67 | Resp 20 | Ht 62.0 in | Wt 140.0 lb

## 2016-03-21 DIAGNOSIS — Z951 Presence of aortocoronary bypass graft: Secondary | ICD-10-CM

## 2016-03-21 DIAGNOSIS — G8918 Other acute postprocedural pain: Secondary | ICD-10-CM

## 2016-03-21 MED ORDER — OXYCODONE HCL 5 MG PO TABS: 5.0000 mg | ORAL_TABLET | Freq: Four times a day (QID) | ORAL | 0 refills | Status: DC | PRN

## 2016-03-21 NOTE — Progress Notes (Signed)
PCP is No PCP Per Patient Referring Provider is Jonelle SidleMcDowell, Samuel G, MD  Chief Complaint  Patient presents with  . Routine Post Op    f/u from surgery with CXR s/p CXR x 3, 02/20/16    HPI: The patient returns for postop follow-up after multivessel CABG after NSTEMI with unstable angina. The patient has a heavy smoking history and peripheral vascular disease with an iliac stent on chronic Plavix. The patient had superficial sternal drainage treated with dressing changes and a short course of antibiotics. This is now resolved.  Since returning home the patient is not resumed smoking. She denies symptoms of recurrent angina. She denies symptoms of CHF. Sternal incision is healing well. Chest x-ray taken today shows significantly improved lung volumes, no pleural effusion The patient's activity level has not been optimal but she will try to walk 10-15 minutes daily.  Past Medical History:  Diagnosis Date  . Anxiety   . Brachial artery occlusion [facility](HCC)    Postcatheterization complication, required surgical repair  . Coronary atherosclerosis of native coronary artery    Multivessel  . Hyperlipidemia   . NSTEMI (non-ST elevated myocardial infarction) (HCC) 11/07   PTCA of distal circumflex 2007, residual 50% LAD and 80% PDA  . Peripheral arterial disease (HCC)    Right common and external iliac stent, right to left fem-fem bypass    Past Surgical History:  Procedure Laterality Date  . CARDIAC CATHETERIZATION  02/15/2016  . CARDIAC CATHETERIZATION N/A 02/15/2016   Procedure: Left Heart Cath and Coronary Angiography;  Surgeon: Lyn RecordsHenry W Smith, MD;  [facility];  Service: Cardiovascular;  Laterality: N/A;  . CORONARY ARTERY BYPASS GRAFT N/A 02/20/2016   Procedure: CORONARY ARTERY BYPASS GRAFTING (CABG)x three,  using left internal mammary artery and right greater saphenous leg vein using endoscope.;  Surgeon: Kerin PernaPeter Van Trigt, MD;  [facility];  Service: Open Heart Surgery;   Laterality: N/A;  . FEMORAL BYPASS  7/08   Right to left; followed by right external iliac artery stent placed 12/08  . PR VEIN BYPASS GRAFT,AORTO-FEM-POP    . SP [place] TRANSCATHETER OCCLUSION     Occlusion off her fem-fem bypass with claudication in both legs  . TEE WITHOUT CARDIOVERSION N/A 02/20/2016   Procedure: TRANSESOPHAGEAL ECHOCARDIOGRAM (TEE);  Surgeon: Kerin PernaPeter Van Trigt, MD;  [facility];  Service: Open Heart Surgery;  Laterality: N/A;    Family History  Problem Relation Age of Onset  . Cancer Mother     Lung  . Heart disease Mother     Heart Disease before age 48  . Heart attack Mother [id number]  . Deep vein thrombosis Father   . Arthritis Father     Back  . Varicose Veins Father   . Cancer Sister     Breast  . Diabetes Neg Hx   . Hypertension Neg Hx   . Coronary artery disease Neg Hx     Social History Social History  Substance Use Topics  . Smoking status: Former Smoker    Packs/day: 0.50    Years: 30.00    Types: Cigarettes    Start date: 01/27/1975    Quit date: 02/13/2016  . Smokeless tobacco: Never Used     Comment: 10 ciggs per day, started back  . Alcohol use No    Current Outpatient Prescriptions  Medication Sig Dispense Refill  . ALPRAZolam (XANAX) 0.5 MG tablet Take 1 tablet (0.5 mg total) by mouth daily as needed for anxiety. 15 tablet  0  . aspirin EC 325 MG EC tablet Take 1 tablet (325 mg total) by mouth daily. 30 tablet 0  . aspirin EC 81 MG tablet Take 243 mg by mouth daily.    . cephALEXin (KEFLEX) 500 MG capsule Take 1 capsule (500 mg total) by mouth 4 (four) times daily. 40 capsule 0  . clopidogrel (PLAVIX) 75 MG tablet Take 75 mg by mouth daily.    . ferrous fumarate-b12-vitamic C-folic acid (TRINSICON / FOLTRIN) capsule Take 1 capsule by mouth 2 (two) times daily after a meal. For one month then stop. 30 capsule 1  . metoprolol tartrate (LOPRESSOR) 25 MG tablet Take 0.5 tablets (12.5 mg total) by mouth 2 (two) times daily. 30 tablet 1  .  oxyCODONE (OXY IR/ROXICODONE) 5 MG immediate release tablet Take 1 tablet (5 mg total) by mouth every 6 (six) hours as needed for severe pain. 30 tablet 0  . pravastatin (PRAVACHOL) 40 MG tablet Take 1 tablet (40 mg total) by mouth daily at 6 PM. 30 tablet 1  . traMADol (ULTRAM) 50 MG tablet Take 1 tablet (50 mg total) by mouth every 4 (four) hours as needed for moderate pain. 30 tablet 0   No current facility-administered medications for this visit.     Allergies  Allergen Reactions  . Aleve [Naproxen] Palpitations  . Crestor [Rosuvastatin] Other (See Comments)    Joint & muscle aches and pain  . Lipitor [Atorvastatin] Other (See Comments)    Joint & muscle aches and pain  . Pravachol [Pravastatin] Other (See Comments)    Joint & muscle aches and pain  . Zocor [Simvastatin] Other (See Comments)    Joint & muscle aches and pain  . Hydrocodone-Acetaminophen Nausea And Vomiting  . Oxycodone-Acetaminophen Nausea And Vomiting    Review of Systems  Improved appetite and sleep pattern No weight loss No fever No ankle edema No nausea Still requires narcotics and has asked for a refill on her oxycodone  BP 104/69 (BP Location: Right Arm, Patient Position: Sitting, Cuff Size: Normal)   Pulse 67   Resp 20   Ht 5\' 2"  (1.575 m)   Wt 140 lb (63.5 kg)   SpO2 98% Comment: RA  BMI 25.61 kg/m  Physical Exam      Exam    General- alert and comfortable   Lungs- clear without rales, wheezes   Cor- regular rate and rhythm, no murmur , gallop   Abdomen- soft, non-tender   Extremities - warm, non-tender, minimal edema   Neuro- oriented, appropriate, no focal weakness  Diagnostic Tests: Chest x-ray clear  Impression: Excellent early recovery after multivessel CABG Patient has successfully stopped smoking completely Sternal incision is now healing well She is ready to start driving, increasing her activity level Patient is not interested in entering outpatient cardiac  rehabilitation  Plan: Return for review of progress in 4 weeks, continue current medications. Refill for oxycodone provided patient Mikey Bussing, MD [facility] [phone]

## 2016-03-21 NOTE — Addendum Note (Signed)
Addended by: Yolonda KidaEVANS, ASHLEIGH N on: 03/21/2016 04:50 PM   Modules accepted: Orders

## 2016-03-26 ENCOUNTER — Encounter: Payer: Medicare Other | Admitting: Cardiology

## 2016-04-03 ENCOUNTER — Other Ambulatory Visit: Payer: Self-pay | Admitting: *Deleted

## 2016-04-03 DIAGNOSIS — G8918 Other acute postprocedural pain: Secondary | ICD-10-CM

## 2016-04-03 MED ORDER — OXYCODONE HCL 5 MG PO TABS: 5.0000 mg | ORAL_TABLET | Freq: Four times a day (QID) | ORAL | 0 refills | Status: DC | PRN

## 2016-04-03 NOTE — Telephone Encounter (Signed)
[name] has called reqesting a refill for her Oxycodone and Xanax s/p CABG 02/20/16. I informed her that a new script would be available today at our office. I told her she should get her Xanax from her PCP. She voiced understanding.

## 2016-04-15 ENCOUNTER — Other Ambulatory Visit: Payer: Self-pay | Admitting: *Deleted

## 2016-04-15 DIAGNOSIS — G8918 Other acute postprocedural pain: Secondary | ICD-10-CM

## 2016-04-15 MED ORDER — TRAMADOL HCL 50 MG PO TABS: 50.0000 mg | ORAL_TABLET | ORAL | 0 refills | Status: DC | PRN

## 2016-04-15 NOTE — Progress Notes (Signed)
Cardiology Office Note  Date: 04/16/2016   ID: Kelsey Hansen, DOB 01-16-1966, MRN [id number]  PCP: No PCP Per Patient  Primary Cardiologist: Nona Dell, MD   Chief Complaint  Patient presents with  . Coronary Artery Disease    History of Present Illness: Kelsey Hansen is a 50 y.o. female last seen in September. She is status post recent CABG in September, continues to slowly recuperate. She does complain of postsurgical thoracic discomfort and soreness, still requesting pain medications from Dr. Zenaida Niece  Trigt's office. She states that she did see a PCP with internal medicine in [place], although I do not have the details.  She has had an interval visit with [facility], ABI was 0.93 on the right and 0.73 on the left, and carotid Dopplers showed 40-60% LICA stenosis and less than 40% RICA stenosis. She continues on medical therapy. She also saw Dr. Donata Clay with sternal incision healing well, cleared to drive and increase her activity level.  Complains of a soreness in her right upper thigh, no erythema or drainage. Denies palpitations or dizziness. States that she has been compliant with her medications as outlined below. Also reports no smoking.  I reviewed follow-up ECG today which shows sinus rhythm with nonspecific ST-T changes.  Past Medical History:  Diagnosis Date  . Anxiety   . 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 Coronary atherosclerosis of native coronary artery    Multivessel  . Hyperlipidemia   . NSTEMI (non-ST elevated myocardial infarction) (HCC) 11/07   PTCA of distal circumflex 2007, residual 50% LAD and 80% PDA  . Peripheral arterial disease (HCC)    Right common and external iliac stent, right to left fem-fem bypass    Past Surgical History:  Procedure Laterality Date  . CARDIAC CATHETERIZATION  02/15/2016  . CARDIAC CATHETERIZATION N/A 02/15/2016   Procedure: Left Heart Cath and Coronary Angiography;   Surgeon: Lyn Records, MD;  Location: [facility] INVASIVE CV LAB;  Service: Cardiovascular;  Laterality: N/A;  . CORONARY ARTERY BYPASS GRAFT N/A 02/20/2016   Procedure: CORONARY ARTERY BYPASS GRAFTING (CABG)x three,  using left internal mammary artery and right greater saphenous leg vein using endoscope.;  Surgeon: Kerin Perna, MD;  Location: [facility] OR;  Service: Open Heart Surgery;  Laterality: N/A;  . FEMORAL BYPASS  7/08   Right to left; followed by right external iliac artery stent placed 12/08  . PR VEIN BYPASS GRAFT,AORTO-FEM-POP    . SP [place] TRANSCATHETER OCCLUSION     Occlusion off her fem-fem bypass with claudication in both legs  . TEE WITHOUT CARDIOVERSION N/A 02/20/2016   Procedure: TRANSESOPHAGEAL ECHOCARDIOGRAM (TEE);  Surgeon: Kerin Perna, MD;  Location: [facility] OR;  Service: Open Heart Surgery;  Laterality: N/A;    Current Outpatient Prescriptions  Medication Sig Dispense Refill  . ALPRAZolam (XANAX) 0.5 MG tablet Take 1 tablet (0.5 mg total) by mouth daily as needed for anxiety. 15 tablet 0  . aspirin EC 81 MG tablet Take 243 mg by mouth daily.    . clopidogrel (PLAVIX) 75 MG tablet Take 75 mg by mouth daily.    . metoprolol tartrate (LOPRESSOR) 25 MG tablet Take 0.5 tablets (12.5 mg total) by mouth 2 (two) times daily. 30 tablet 1  . nitroGLYCERIN (NITROSTAT) 0.4 MG SL tablet Place 0.4 mg under the tongue every 5 (five) minutes as needed for chest pain.    Marland Kitchen oxyCODONE (OXY IR/ROXICODONE) 5 MG immediate release  tablet Take 1 tablet (5 mg total) by mouth every 6 (six) hours as needed for severe pain. 30 tablet 0  . pravastatin (PRAVACHOL) 40 MG tablet Take 1 tablet (40 mg total) by mouth daily at 6 PM. 30 tablet 1  . traMADol (ULTRAM) 50 MG tablet Take 1 tablet (50 mg total) by mouth every 4 (four) hours as needed for moderate pain. 30 tablet 0   No current facility-administered medications for this visit.    Allergies:  Aleve [naproxen]; Crestor [rosuvastatin]; Lipitor [atorvastatin];  Pravachol [pravastatin]; Zocor [simvastatin]; Hydrocodone-acetaminophen; and Oxycodone-acetaminophen   Social History: The patient  reports that she quit smoking about 2 months ago. Her smoking use included Cigarettes. She started smoking about 41 years ago. She has a 15.00 pack-year smoking history. She has never used smokeless tobacco. She reports that she does not drink alcohol or use drugs.   ROS:  Please see the history of present illness. Otherwise, complete review of systems is positive for no fevers or chills, appetite stable.  All other systems are reviewed and negative.   Physical Exam: VS:  BP 128/81   Pulse 67   Ht 5\' 2"  (1.575 m)   Wt 143 lb (64.9 kg)   BMI 26.16 kg/m , BMI Body mass index is 26.16 kg/m.  Wt Readings from Last 3 Encounters:  04/16/16 143 lb (64.9 kg)  03/21/16 140 lb (63.5 kg)  03/20/16 141 lb (64 kg)    General: Chronically ill-appearing woman in no distress. HEENT: Conjunctiva and lids normal, oropharynx clear. Neck: Supple, no elevated JVP or carotid bruits, no thyromegaly. Thorax: Well healed sternal incision. Lungs: Clear to auscultation, nonlabored breathing at rest. Cardiac: Regular rate and rhythm, no S3, no pericardial rub. Abdomen: Soft, nontender, bowel sounds present, no guarding or rebound. Extremities: No erythema or hematoma of the legs, distal pulses 1-2+. Skin: Warm and dry. Musculoskeletal: No kyphosis. Neuropsychiatric: Alert and oriented x3, affect grossly appropriate.  ECG: I personally reviewed the tracing from 02/21/2016 which showed normal sinus rhythm with nonspecific diffuse ST-T wave abnormalities.  Recent Labwork: 02/16/2016: TSH 4.034 02/19/2016: ALT 16; AST 22 02/21/2016: Magnesium 1.8 03/01/2016: BUN 5; Creatinine, Ser 0.63; Hemoglobin 8.8; Platelets 622; Potassium 3.7; Sodium 133     Component Value Date/Time   CHOL 232 (H) 05/03/2015 1110   TRIG 107 05/03/2015 1110   HDL 44 05/03/2015 1110   CHOLHDL 5.3 05/03/2015 1110     VLDL 21 05/03/2015 1110   LDLCALC 167 (H) 05/03/2015 1110   LDLDIRECT 96.7 10/19/2006 1506    Other Studies Reviewed Today:  Cardiac catheterization 02/15/2016:  Prox RCA-2 lesion, 75 %stenosed.  Prox RCA-1 lesion, 90 %stenosed.  Mid RCA-2 lesion, 85 %stenosed.  Mid RCA-1 lesion, 80 %stenosed.  Ost Ramus to Ramus lesion, 65 %stenosed.  Prox Cx to Mid Cx lesion, 75 %stenosed.  1st Diag lesion, 50 %stenosed.  Ost LAD lesion, 90 %stenosed.  The left ventricular ejection fraction is 55-65% by visual estimate.  The left ventricular systolic function is normal.  LV end diastolic pressure is normal.   Severe 2 vessel coronary disease involving the right coronary diffusely (relatively small vessel), and significant proximal LAD involvement. The circumflex contains moderate coronary disease including the site of previous angioplasty in the mid vessel at the time of infarction in 2007. Ramus has intermediate stenosis.  Normal left ventricular systolic function. LVEF at least 60% with normal EDP.  Severe peripheral vascular disease with right iliac stents and left femoral-femoral bypass. Prior right brachial  occlusion at the time of catheterization 2007 requiring thrombectomy by Dr. Bud FaceGregory Hayes.  Echocardiogram 02/16/2016: Study Conclusions  - Left ventricle: The cavity size was normal. Wall thickness was increased in a pattern of mild LVH. Systolic function was normal. The estimated ejection fraction was in the range of 60% to 65%. Wall motion was normal; there were no regional wall motion abnormalities. Left ventricular diastolic function parameters were normal. - Aortic valve: Trileaflet. Sclerosis without stenosis. There was mild regurgitation. - Left atrium: The atrium was normal in size. - Inferior vena cava: The vessel was normal in size. The respirophasic diameter changes were in the normal range (>= 50%), consistent with normal central venous  pressure.  Impressions:  - LVEF 60-65%, mild LVH, normal wall motion, normal diastolic function, aortic valve sclerosis with mild AI, normal LA size, normal IVC.  Assessment and Plan:  1. Multivessel CAD status post recent CABG in September with Dr. Donata ClayVan Trigt. She is slowly recuperating, continues to report postsurgical thoracic discomfort. ECG reviewed and stable. I reviewed her medications, no changes made from a cardiac perspective. Recommended gradual increase in activity, walking regimen to try and build strength and stamina.  2. Hyperlipidemia, reports tolerating Pravachol this point. Anticipate getting follow-up lipid panel around the time of her next visit.  3. PAD, symptomatically stable and followed by [facility] with recent ABIs outlined above.  4. Tobacco abuse in remission.  5. Health maintenance - reinforced maintaining follow-up with PCP going forward.  Current medicines were reviewed with the patient today.   Orders Placed This Encounter  Procedures  . EKG 12-Lead    Disposition: Follow-up in 3 months.  Signed, Jonelle SidleSamuel G. McDowell, MD, [facility] 04/16/2016 9:20 AM    [facility] [address], [place], [place] [address] Phone: [phone]; Fax: [phone]

## 2016-04-15 NOTE — Telephone Encounter (Signed)
[name] has called for a refill for Oxycodone s/p CABG 02/20/16. She has seen Dr. Donata ClayVan Trigt post op and is scheduled for a f/u visit on 04/23/16. I called and discussed this with her. I told her that since she was almost 2 months post op that she should try and decrease her usage to something less strong, like Tramadol. She said that I could fax it to her pharmacy, but she would be discussing this pain with Dr. Donata ClayVan Trigt at her next visit.

## 2016-04-16 ENCOUNTER — Encounter: Payer: Self-pay | Admitting: Cardiology

## 2016-04-16 ENCOUNTER — Ambulatory Visit (INDEPENDENT_AMBULATORY_CARE_PROVIDER_SITE_OTHER): Payer: Medicare Other | Admitting: Cardiology

## 2016-04-16 VITALS — BP 128/81 | HR 67 | Ht 62.0 in | Wt 143.0 lb

## 2016-04-16 DIAGNOSIS — F17201 Nicotine dependence, unspecified, in remission: Secondary | ICD-10-CM

## 2016-04-16 DIAGNOSIS — I739 Peripheral vascular disease, unspecified: Secondary | ICD-10-CM

## 2016-04-16 DIAGNOSIS — I2510 Atherosclerotic heart disease of native coronary artery without angina pectoris: Secondary | ICD-10-CM

## 2016-04-16 DIAGNOSIS — E782 Mixed hyperlipidemia: Secondary | ICD-10-CM | POA: Diagnosis not present

## 2016-04-16 DIAGNOSIS — I200 Unstable angina: Secondary | ICD-10-CM

## 2016-04-16 NOTE — Patient Instructions (Signed)
Medication Instructions:  Continue all current medications.  Labwork: none  Testing/Procedures: none  Follow-Up: 3 months   Any Other Special Instructions Will Be Listed Below (If Applicable).  If you need a refill on your cardiac medications before your next appointment, please call your pharmacy.  

## 2016-04-23 ENCOUNTER — Encounter: Payer: Self-pay | Admitting: Cardiothoracic Surgery

## 2016-04-23 ENCOUNTER — Ambulatory Visit (INDEPENDENT_AMBULATORY_CARE_PROVIDER_SITE_OTHER): Payer: Self-pay | Admitting: Cardiothoracic Surgery

## 2016-04-23 VITALS — BP 117/79 | HR 70 | Resp 20 | Ht 62.0 in | Wt 143.0 lb

## 2016-04-23 DIAGNOSIS — Z951 Presence of aortocoronary bypass graft: Secondary | ICD-10-CM

## 2016-04-23 NOTE — Progress Notes (Signed)
PCP is No PCP Per Patient Referring Provider is Jonelle SidleMcDowell, Samuel G, MD  Chief Complaint  Patient presents with  . Routine Post Op    4 week f/u    HPI: Scheduled 2 month follow-up after urgent CABG 3 for unstable angina and non-STEMI. Doing well. Recent office visit with  her cardiologist. EKG sinus rhythm without ischemia. No recurrent angina. Remains with sensitive and superficially sore sternal incision which is well-healed. No evidence of malunion on exam or chest x-ray. Sternal wires aligned and intact. Lungs are clear. She has not resumed smoking. She is walking 20 minutes 1 or 2 times daily. She has not received any more oxycodone. She is currently finishing a prescription for tramadol. She was provided a prescription for Voltaren ointment at today's visit.  Past Medical History:  Diagnosis Date  . Anxiety   . Brachial artery occlusion [facility](HCC)    Postcatheterization complication, required surgical repair  . Coronary atherosclerosis of native coronary artery    Multivessel  . Hyperlipidemia   . NSTEMI (non-ST elevated myocardial infarction) (HCC) 11/07   PTCA of distal circumflex 2007, residual 50% LAD and 80% PDA  . Peripheral arterial disease (HCC)    Right common and external iliac stent, right to left fem-fem bypass    Past Surgical History:  Procedure Laterality Date  . CARDIAC CATHETERIZATION  02/15/2016  . CARDIAC CATHETERIZATION N/A 02/15/2016   Procedure: Left Heart Cath and Coronary Angiography;  Surgeon: Lyn RecordsHenry W Smith, MD;  [facility];  Service: Cardiovascular;  Laterality: N/A;  . CORONARY ARTERY BYPASS GRAFT N/A 02/20/2016   Procedure: CORONARY ARTERY BYPASS GRAFTING (CABG)x three,  using left internal mammary artery and right greater saphenous leg vein using endoscope.;  Surgeon: Kerin PernaPeter Van Trigt, MD;  [facility];  Service: Open Heart Surgery;  Laterality: N/A;  . FEMORAL BYPASS  7/08   Right to left; followed by right external iliac artery stent  placed 12/08  . PR VEIN BYPASS GRAFT,AORTO-FEM-POP    . SP US TRANSCATHETER OCCLUSION     Occlusion off her fem-fem bypass with claudication in both legs  . TEE WITHOUT CARDIOVERSION N/A 02/20/2016   Procedure: TRANSESOPHAGEAL ECHOCARDIOGRAM (TEE);  Surgeon: Kerin PernaPeter Van Trigt, MD;  [facility];  Service: Open Heart Surgery;  Laterality: N/A;    Family History  Problem Relation Age of Onset  . Cancer Mother     Lung  . Heart disease Mother     Heart Disease before age 48  . Heart attack Mother [id number]  . Deep vein thrombosis Father   . Arthritis Father     Back  . Varicose Veins Father   . Cancer Sister     Breast  . Diabetes Neg Hx   . Hypertension Neg Hx   . Coronary artery disease Neg Hx     Social History Social History  Substance Use Topics  . Smoking status: Former Smoker    Packs/day: 0.50    Years: 30.00    Types: Cigarettes    Start date: 01/27/1975    Quit date: 02/13/2016  . Smokeless tobacco: Never Used     Comment: 10 ciggs per day, started back  . Alcohol use No    Current Outpatient Prescriptions  Medication Sig Dispense Refill  . ALPRAZolam (XANAX) 0.5 MG tablet Take 1 tablet (0.5 mg total) by mouth daily as needed for anxiety. 15 tablet 0  . aspirin EC 81 MG tablet Take 243 mg by mouth daily.    .Marland Kitchen  clopidogrel (PLAVIX) 75 MG tablet Take 75 mg by mouth daily.    . metoprolol tartrate (LOPRESSOR) 25 MG tablet Take 0.5 tablets (12.5 mg total) by mouth 2 (two) times daily. 30 tablet 1  . nitroGLYCERIN (NITROSTAT) 0.4 MG SL tablet Place 0.4 mg under the tongue every 5 (five) minutes as needed for chest pain.    Marland Kitchen. oxyCODONE (OXY IR/ROXICODONE) 5 MG immediate release tablet Take 1 tablet (5 mg total) by mouth every 6 (six) hours as needed for severe pain. 30 tablet 0  . pravastatin (PRAVACHOL) 40 MG tablet Take 1 tablet (40 mg total) by mouth daily at 6 PM. 30 tablet 1  . traMADol (ULTRAM) 50 MG tablet Take 1 tablet (50 mg total) by mouth every 4 (four) hours as  needed for moderate pain. 30 tablet 0   No current facility-administered medications for this visit.     Allergies  Allergen Reactions  . Aleve [Naproxen] Palpitations  . Crestor [Rosuvastatin] Other (See Comments)    Joint & muscle aches and pain  . Lipitor [Atorvastatin] Other (See Comments)    Joint & muscle aches and pain  . Pravachol [Pravastatin] Other (See Comments)    Joint & muscle aches and pain  . Zocor [Simvastatin] Other (See Comments)    Joint & muscle aches and pain  . Hydrocodone-Acetaminophen Nausea And Vomiting  . Oxycodone-Acetaminophen Nausea And Vomiting    Review of Systems  Slowly improving and getting her strength back No cough, ankle edema, orthopnea Hypersensitivity over the sternal and chest area  BP 117/79 (BP Location: Right Arm, Patient Position: Sitting, Cuff Size: Small)   Pulse 70   Resp 20   Ht 5\' 2"  (1.575 m)   Wt 143 lb (64.9 kg)   SpO2 99% Comment: RA  BMI 26.16 kg/m  Physical Exam Alert and pleasant Lungs clear Heart rhythm regular up murmur Sternal incision well-healed without click but with sensitivity to touch Abdomen soft Extremities without edema, vein harvest site well-healed  Diagnostic Tests:   Impression: Excellent two-month recovery after multivessel CABG Patient was reassured that she does not need narcotics for the hypersensitive sternal incision and should use Tylenol, Voltaren ointment and finish her tramadol as needed but to reduce the dosing to twice daily.  Plan:Return as needed   Mikey BussingPeter Van Trigt III, MD [facility] [phone]

## 2016-05-07 ENCOUNTER — Other Ambulatory Visit: Payer: Self-pay | Admitting: Physician Assistant

## 2016-05-12 ENCOUNTER — Other Ambulatory Visit: Payer: Self-pay | Admitting: Physician Assistant

## 2016-05-13 ENCOUNTER — Other Ambulatory Visit: Payer: Self-pay | Admitting: Cardiology

## 2016-07-21 ENCOUNTER — Ambulatory Visit: Payer: Medicare Other | Admitting: Cardiology

## 2016-07-21 NOTE — Progress Notes (Deleted)
Cardiology Office Note  Date: 07/21/2016   ID: Kelsey Hansen, DOB 07/22/1965, MRN [id number]  PCP: No PCP Per Patient  Primary Cardiologist: Nona DellSamuel McDowell, MD   No chief complaint on file.   History of Present Illness: Kelsey Islamracy L Kisamore is a 51 y.o. female last seen in November 2017.  She had a follow-up visit with Dr. Donata ClayVan Trigt in November 2017.  Past Medical History:  Diagnosis Date  . Anxiety   . 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 Coronary atherosclerosis of native coronary artery    Multivessel  . Hyperlipidemia   . NSTEMI (non-ST elevated myocardial infarction) (HCC) 11/07   PTCA of distal circumflex 2007, residual 50% LAD and 80% PDA  . Peripheral arterial disease (HCC)    Right common and external iliac stent, right to left fem-fem bypass    Past Surgical History:  Procedure Laterality Date  . CARDIAC CATHETERIZATION  02/15/2016  . CARDIAC CATHETERIZATION N/A 02/15/2016   Procedure: Left Heart Cath and Coronary Angiography;  Surgeon: Lyn RecordsHenry W Smith, MD;  [facility];  Service: Cardiovascular;  Laterality: N/A;  . CORONARY ARTERY BYPASS GRAFT N/A 02/20/2016   Procedure: CORONARY ARTERY BYPASS GRAFTING (CABG)x three,  using left internal mammary artery and right greater saphenous leg vein using endoscope.;  Surgeon: Kerin PernaPeter Van Trigt, MD;  [facility];  Service: Open Heart Surgery;  Laterality: N/A;  . FEMORAL BYPASS  7/08   Right to left; followed by right external iliac artery stent placed 12/08  . PR VEIN BYPASS GRAFT,AORTO-FEM-POP    . SP US TRANSCATHETER OCCLUSION     Occlusion off her fem-fem bypass with claudication in both legs  . TEE WITHOUT CARDIOVERSION N/A 02/20/2016   Procedure: TRANSESOPHAGEAL ECHOCARDIOGRAM (TEE);  Surgeon: Kerin PernaPeter Van Trigt, MD;  [facility];  Service: Open Heart Surgery;  Laterality: N/A;    Current Outpatient Prescriptions  Medication Sig Dispense Refill    . ALPRAZolam (XANAX) 0.5 MG tablet Take 1 tablet (0.5 mg total) by mouth daily as needed for anxiety. 15 tablet 0  . aspirin EC 81 MG tablet Take 243 mg by mouth daily.    . clopidogrel (PLAVIX) 75 MG tablet Take 75 mg by mouth daily.    . metoprolol tartrate (LOPRESSOR) 25 MG tablet TAKE 1/2 TABLET BY MOUTH TWICE DAILY 30 tablet 3  . nitroGLYCERIN (NITROSTAT) 0.4 MG SL tablet Place 0.4 mg under the tongue every 5 (five) minutes as needed for chest pain.    Marland Kitchen. oxyCODONE (OXY IR/ROXICODONE) 5 MG immediate release tablet Take 1 tablet (5 mg total) by mouth every 6 (six) hours as needed for severe pain. 30 tablet 0  . pravastatin (PRAVACHOL) 40 MG tablet Take 1 tablet (40 mg total) by mouth daily at 6 PM. 30 tablet 1  . traMADol (ULTRAM) 50 MG tablet Take 1 tablet (50 mg total) by mouth every 4 (four) hours as needed for moderate pain. 30 tablet 0   No current facility-administered medications for this visit.    Allergies:  Aleve [naproxen]; Crestor [rosuvastatin]; Lipitor [atorvastatin]; Pravachol [pravastatin]; Zocor [simvastatin]; Hydrocodone-acetaminophen; and Oxycodone-acetaminophen   Social History: The patient  reports that she quit smoking about 5 months ago. Her smoking use included Cigarettes. She started smoking about 41 years ago. She has a 15.00 pack-year smoking history. She has never used smokeless tobacco. She reports that she does not drink alcohol or use drugs.   Family History:  The patient's family history includes Arthritis in her father; Cancer in her mother and sister; Deep vein thrombosis in her father; Heart attack (age of onset: 45) in her mother; Heart disease in her mother; Varicose Veins in her father.   ROS:  Please see the history of present illness. Otherwise, complete review of systems is positive for {NONE DEFAULTED:18576::"none"}.  All other systems are reviewed and negative.   Physical Exam: VS:  There were no vitals taken for this visit., BMI There is no height  or weight on file to calculate BMI.  Wt Readings from Last 3 Encounters:  04/23/16 143 lb (64.9 kg)  04/16/16 143 lb (64.9 kg)  03/21/16 140 lb (63.5 kg)    General: Chronically ill-appearing woman in no distress. HEENT: Conjunctiva and lids normal, oropharynx clear. Neck: Supple, no elevated JVP or carotid bruits, no thyromegaly. Thorax: Well healed sternal incision. Lungs: Clear to auscultation, nonlabored breathing at rest. Cardiac: Regular rate and rhythm, no S3, no pericardial rub. Abdomen: Soft, nontender, bowel sounds present, no guarding or rebound. Extremities: No erythema or hematoma of the legs, distal pulses 1-2+. Skin: Warm and dry. Musculoskeletal: No kyphosis. Neuropsychiatric: Alert and oriented x3, affect grossly appropriate.  ECG: I personally reviewed the tracing from 04/16/2016 which showed sinus rhythm with nonspecific ST-T changes.  Recent Labwork: 02/16/2016: TSH 4.034 02/19/2016: ALT 16; AST 22 02/21/2016: Magnesium 1.8 03/01/2016: BUN 5; Creatinine, Ser 0.63; Hemoglobin 8.8; Platelets 622; Potassium 3.7; Sodium 133     Component Value Date/Time   CHOL 232 (H) 05/03/2015 1110   TRIG 107 05/03/2015 1110   HDL 44 05/03/2015 1110   CHOLHDL 5.3 05/03/2015 1110   VLDL 21 05/03/2015 1110   LDLCALC 167 (H) 05/03/2015 1110   LDLDIRECT 96.7 10/19/2006 1506    Other Studies Reviewed Today:  Echocardiogram 02/16/2016: Study Conclusions  - Left ventricle: The cavity size was normal. Wall thickness was   increased in a pattern of mild LVH. Systolic function was normal.   The estimated ejection fraction was in the range of 60% to 65%.   Wall motion was normal; there were no regional wall motion   abnormalities. Left ventricular diastolic function parameters   were normal. - Aortic valve: Trileaflet. Sclerosis without stenosis. There was   mild regurgitation. - Left atrium: The atrium was normal in size. - Inferior vena cava: The vessel was normal in size. The    respirophasic diameter changes were in the normal range (>= 50%),   consistent with normal central venous pressure.  Impressions:  - LVEF 60-65%, mild LVH, normal wall motion, normal diastolic   function, aortic valve sclerosis with mild AI, normal LA size,   normal IVC.  Assessment and Plan:    Current medicines were reviewed with the patient today.  No orders of the defined types were placed in this encounter.   Disposition:  Signed, Jonelle Sidle, MD, [facility] 07/21/2016 7:46 AM    [facility] at [facility] [address] [place], [place], [place] [address] Phone: [phone]; Fax: [phone]

## 2016-08-06 NOTE — Progress Notes (Signed)
Cardiology Office Note  Date: 08/07/2016   ID: Kelsey Hansen, DOB [birth year], MRN [id number]  PCP: No PCP Per Patient  Primary Cardiologist: Nona DellSamuel McDowell, MD   Chief Complaint  Patient presents with  . Coronary Artery Disease    History of Present Illness: Kelsey Hansen is a 51 y.o. female last seen in November 2017 status post CABG in September.She presents for a follow-up visit. Reports postsurgical sternal discomfort when she twists and moves certain ways. Denies any major heavy lifting. She has not had any angina symptoms. Still has not established with PCP, but states that she is to see a Dr. Allena KatzPatel next week in [place].  Reviewed her medications. She stopped taking Pravachol describing muscle pains. She has a history of significant statin intolerance as outlined in the chart. She is due for a follow-up lipid panel.  Blood pressure and heart rate are well controlled today. She remains on dual antiplatelet therapy and low-dose beta blocker.  She does tell me that she started smoking again, lost a close friend back in December and has been under a lot of stress.  Past Medical History:  Diagnosis Date  . Anxiety   . 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 Coronary atherosclerosis of native coronary artery    Multivessel  . Hyperlipidemia   . NSTEMI (non-ST elevated myocardial infarction) (HCC) 11/07   PTCA of distal circumflex 2007, residual 50% LAD and 80% PDA  . Peripheral arterial disease (HCC)    Right common and external iliac stent, right to left fem-fem bypass    Past Surgical History:  Procedure Laterality Date  . CARDIAC CATHETERIZATION  02/15/2016  . CARDIAC CATHETERIZATION N/A 02/15/2016   Procedure: Left Heart Cath and Coronary Angiography;  Surgeon: Lyn RecordsHenry W Smith, MD;  [facility];  Service: Cardiovascular;  Laterality: N/A;  . CORONARY ARTERY BYPASS GRAFT N/A 02/20/2016   Procedure:  CORONARY ARTERY BYPASS GRAFTING (CABG)x three,  using left internal mammary artery and right greater saphenous leg vein using endoscope.;  Surgeon: Kerin PernaPeter Van Trigt, MD;  [facility];  Service: Open Heart Surgery;  Laterality: N/A;  . FEMORAL BYPASS  7/08   Right to left; followed by right external iliac artery stent placed 12/08  . PR VEIN BYPASS GRAFT,AORTO-FEM-POP    . SP US TRANSCATHETER OCCLUSION     Occlusion off her fem-fem bypass with claudication in both legs  . TEE WITHOUT CARDIOVERSION N/A 02/20/2016   Procedure: TRANSESOPHAGEAL ECHOCARDIOGRAM (TEE);  Surgeon: Kerin PernaPeter Van Trigt, MD;  [facility];  Service: Open Heart Surgery;  Laterality: N/A;    Current Outpatient Prescriptions  Medication Sig Dispense Refill  . aspirin EC 81 MG tablet Take 81 mg by mouth daily.     . clopidogrel (PLAVIX) 75 MG tablet Take 75 mg by mouth daily.    . diclofenac sodium (VOLTAREN) 1 % GEL Apply topically as needed.    . metoprolol tartrate (LOPRESSOR) 25 MG tablet TAKE 1/2 TABLET BY MOUTH TWICE DAILY 30 tablet 3  . nitroGLYCERIN (NITROSTAT) 0.4 MG SL tablet Place 0.4 mg under the tongue every 5 (five) minutes as needed for chest pain.     No current facility-administered medications for this visit.    Allergies:  Aleve [naproxen]; Crestor [rosuvastatin]; Lipitor [atorvastatin]; Pravachol [pravastatin]; Zocor [simvastatin]; Hydrocodone-acetaminophen; and Oxycodone-acetaminophen   Social History: The patient  reports that she quit smoking about 5 months ago. Her smoking use included  Cigarettes. She started smoking about 41 years ago. She has a 15.00 pack-year smoking history. She has never used smokeless tobacco. She reports that she does not drink alcohol or use drugs.   ROS:  Please see the history of present illness. Otherwise, complete review of systems is positive for musculoskeletal thoracic discomfort.  All other systems are reviewed and negative.   Physical Exam: VS:  BP 122/77   Pulse  62   Ht 5\' 2"  (1.575 m)   Wt 139 lb 9.6 oz (63.3 kg)   SpO2 97%   BMI 25.53 kg/m , BMI Body mass index is 25.53 kg/m.  Wt Readings from Last 3 Encounters:  08/07/16 139 lb 9.6 oz (63.3 kg)  04/23/16 143 lb (64.9 kg)  04/16/16 143 lb (64.9 kg)    General: Chronically ill-appearing woman in no distress. HEENT: Conjunctiva and lids normal, oropharynx clear. Neck: Supple, no elevated JVP or carotid bruits, no thyromegaly. Thorax: Well healed sternal incision. No sternal click or obvious instability. Lungs: Clear to auscultation, nonlabored breathing at rest. Cardiac: Regular rate and rhythm, no S3, no pericardial rub. Abdomen: Soft, nontender, bowel sounds present, no guarding or rebound. Extremities: No erythema or hematoma of the legs, distal pulses 1-2+. Skin: Warm and dry. Musculoskeletal: No kyphosis. Neuropsychiatric: Alert and oriented x3, affect grossly appropriate.  ECG: I personally reviewed the tracing from 04/16/2016 which showed sinus rhythm with nonspecific ST-T changes.  Recent Labwork: 02/16/2016: TSH 4.034 02/19/2016: ALT 16; AST 22 02/21/2016: Magnesium 1.8 03/01/2016: BUN 5; Creatinine, Ser 0.63; Hemoglobin 8.8; Platelets 622; Potassium 3.7; Sodium 133     Component Value Date/Time   CHOL 232 (H) 05/03/2015 1110   TRIG 107 05/03/2015 1110   HDL 44 05/03/2015 1110   CHOLHDL 5.3 05/03/2015 1110   VLDL 21 05/03/2015 1110   LDLCALC 167 (H) 05/03/2015 1110   LDLDIRECT 96.7 10/19/2006 1506    Other Studies Reviewed Today:  Cardiac catheterization 02/15/2016:  Prox RCA-2 lesion, 75 %stenosed.  Prox RCA-1 lesion, 90 %stenosed.  Mid RCA-2 lesion, 85 %stenosed.  Mid RCA-1 lesion, 80 %stenosed.  Ost Ramus to Ramus lesion, 65 %stenosed.  Prox Cx to Mid Cx lesion, 75 %stenosed.  1st Diag lesion, 50 %stenosed.  Ost LAD lesion, 90 %stenosed.  The left ventricular ejection fraction is 55-65% by visual estimate.  The left ventricular systolic function is  normal.  LV end diastolic pressure is normal.   Severe 2 vessel coronary disease involving the right coronary diffusely (relatively small vessel), and significant proximal LAD involvement. The circumflex contains moderate coronary disease including the site of previous angioplasty in the mid vessel at the time of infarction in 2007. Ramus has intermediate stenosis.  Normal left ventricular systolic function. LVEF at least 60% with normal EDP.  Severe peripheral vascular disease with right iliac stents and left femoral-femoral bypass. Prior right brachial occlusion at the time of catheterization 2007 requiring thrombectomy by Dr. Bud Face.  Echocardiogram 02/16/2016: Study Conclusions  - Left ventricle: The cavity size was normal. Wall thickness was increased in a pattern of mild LVH. Systolic function was normal. The estimated ejection fraction was in the range of 60% to 65%. Wall motion was normal; there were no regional wall motion abnormalities. Left ventricular diastolic function parameters were normal. - Aortic valve: Trileaflet. Sclerosis without stenosis. There was mild regurgitation. - Left atrium: The atrium was normal in size. - Inferior vena cava: The vessel was normal in size. The respirophasic diameter changes were in the normal range (>=  50%), consistent with normal central venous pressure.  Impressions:  - LVEF 60-65%, mild LVH, normal wall motion, normal diastolic function, aortic valve sclerosis with mild AI, normal LA size, normal IVC.  Assessment and Plan:  1. Multivessel CAD status post CABG in September 2018 with Dr. Donata Clay. She does not report any angina symptoms, I reviewed her current medical regimen. She is describing musculoskeletal thoracic discomfort, chest looks to be well healed however without obvious instability. We will obtain a follow-up PA and lateral chest x-ray. She has been using Voltaren cream.  2.  Hyperlipidemia with statin intolerance, not able to tolerate Pravachol most recently. Need to obtain a follow-up lipid panel and can discuss other options. She states that she is hesitant to use injectable agents.  3. Recurrent tobacco abuse. We will continue to discuss smoking cessation strategies.  4. Health maintenance, states that she is establishing with Dr. Allena Katz in [place] next week for PCP follow-up.  Current medicines were reviewed with the patient today.   Orders Placed This Encounter  Procedures  . DG Chest 2 View  . Lipid panel    Disposition: Follow-up in 6 months.  Signed, Jonelle Sidle, MD, [facility] 08/07/2016 9:34 AM    [facility] at [facility] [address] [place], [place], [place] [address] Phone: [phone]; Fax: [phone]

## 2016-08-07 ENCOUNTER — Ambulatory Visit (INDEPENDENT_AMBULATORY_CARE_PROVIDER_SITE_OTHER): Payer: Medicare Other | Admitting: Cardiology

## 2016-08-07 ENCOUNTER — Encounter: Payer: Self-pay | Admitting: Cardiology

## 2016-08-07 VITALS — BP 122/77 | HR 62 | Ht 62.0 in | Wt 139.6 lb

## 2016-08-07 DIAGNOSIS — I2510 Atherosclerotic heart disease of native coronary artery without angina pectoris: Secondary | ICD-10-CM | POA: Diagnosis not present

## 2016-08-07 DIAGNOSIS — Z789 Other specified health status: Secondary | ICD-10-CM | POA: Diagnosis not present

## 2016-08-07 DIAGNOSIS — E782 Mixed hyperlipidemia: Secondary | ICD-10-CM | POA: Diagnosis not present

## 2016-08-07 DIAGNOSIS — R072 Precordial pain: Secondary | ICD-10-CM

## 2016-08-07 DIAGNOSIS — Z720 Tobacco use: Secondary | ICD-10-CM | POA: Diagnosis not present

## 2016-08-07 NOTE — Patient Instructions (Signed)
Your physician wants you to follow-up in: 6 months with Dr. Diona BrownerMcDowell. You will receive a reminder letter in the mail two months in advance. If you don't receive a letter, please call our office to schedule the follow-up appointment.    Your physician recommends that you continue on your current medications as directed. Please refer to the Current Medication list given to you today.   Your physician recommends that you return for lab work in:  Fasting Lipid panel  A chest x-ray takes a picture of the organs and structures inside the chest, including the heart, lungs, and blood vessels. This test can show several things, including, whether the heart is enlarges; whether fluid is building up in the lungs; and whether pacemaker / defibrillator leads are still in place.   Thank you for choosing [facility]!!

## 2016-08-07 NOTE — Addendum Note (Signed)
Addended by: Thomasenia SalesSTANLEY, Kyarra Vancamp A on: 08/07/2016 09:37 AM   Modules accepted: Orders

## 2016-09-11 ENCOUNTER — Telehealth: Payer: Self-pay

## 2016-09-11 DIAGNOSIS — I83811 Varicose veins of right lower extremities with pain: Secondary | ICD-10-CM

## 2016-09-11 DIAGNOSIS — M79604 Pain in right leg: Secondary | ICD-10-CM

## 2016-09-11 NOTE — Telephone Encounter (Signed)
Pt. Called to report "problems with right leg"  c/o knot on anterior right lower leg, approx. 2 " below the knee. Also stated she has bulging VV that is tender on ant. Right LE. Also reported "in the upper right thigh, where I had my quadruple BP, there is swelling and bruising, where the vein blowed-out."  Attempted to question how long the swelling had been present, and the pt. Became very upset.  Stated "I can't remember when all this started; if you don't want to give me an appt., then just say so!"  Stated "I've been trying to get an appt. for 2 weeks, and now you keep asking me all these questions; I just want an appt!"  Attempted to explain that I need to ask specific questions to help the doctor to understand the symptoms and better direct her care.  Advised will discuss with Dr. Darrick PennaFields and call her back with appt. Info. Pt. Agreed.   Discussed above symptoms with Dr. Darrick PennaFields.  Advised to schedule a right LE venous reflux and office visit.

## 2016-09-15 NOTE — Telephone Encounter (Signed)
Made appts for 5/17 soonest I had available, LVM on pt home # will  Mail letter as well

## 2016-09-16 DIAGNOSIS — L600 Ingrowing nail: Secondary | ICD-10-CM | POA: Insufficient documentation

## 2016-09-16 DIAGNOSIS — B351 Tinea unguium: Secondary | ICD-10-CM | POA: Insufficient documentation

## 2016-09-16 DIAGNOSIS — M79673 Pain in unspecified foot: Secondary | ICD-10-CM | POA: Insufficient documentation

## 2016-09-16 DIAGNOSIS — M2010 Hallux valgus (acquired), unspecified foot: Secondary | ICD-10-CM | POA: Insufficient documentation

## 2016-09-16 DIAGNOSIS — I779 Disorder of arteries and arterioles, unspecified: Secondary | ICD-10-CM | POA: Insufficient documentation

## 2016-10-10 ENCOUNTER — Other Ambulatory Visit: Payer: Self-pay | Admitting: Cardiology

## 2016-10-27 ENCOUNTER — Encounter: Payer: Self-pay | Admitting: Family

## 2016-10-28 ENCOUNTER — Other Ambulatory Visit: Payer: Self-pay | Admitting: Physician Assistant

## 2016-10-28 ENCOUNTER — Other Ambulatory Visit: Payer: Self-pay | Admitting: *Deleted

## 2016-10-28 MED ORDER — CLOPIDOGREL BISULFATE 75 MG PO TABS: 75.0000 mg | ORAL_TABLET | Freq: Every day | ORAL | 1 refills | Status: DC

## 2016-10-30 ENCOUNTER — Encounter: Payer: Self-pay | Admitting: Vascular Surgery

## 2016-10-30 ENCOUNTER — Ambulatory Visit (INDEPENDENT_AMBULATORY_CARE_PROVIDER_SITE_OTHER): Payer: Medicare Other | Admitting: Vascular Surgery

## 2016-10-30 ENCOUNTER — Ambulatory Visit (HOSPITAL_COMMUNITY)
Admission: RE | Admit: 2016-10-30 | Discharge: 2016-10-30 | Disposition: A | Discharge status: Home / Self Care | Payer: Medicare Other | Source: Ambulatory Visit | Attending: Vascular Surgery | Admitting: Vascular Surgery

## 2016-10-30 VITALS — BP 138/78 | HR 63 | Temp 97.4°F | Resp 20 | Ht 62.0 in | Wt 144.3 lb

## 2016-10-30 DIAGNOSIS — I83811 Varicose veins of right lower extremities with pain: Secondary | ICD-10-CM | POA: Diagnosis not present

## 2016-10-30 DIAGNOSIS — M79604 Pain in right leg: Secondary | ICD-10-CM

## 2016-10-30 DIAGNOSIS — I739 Peripheral vascular disease, unspecified: Secondary | ICD-10-CM | POA: Diagnosis not present

## 2016-10-30 DIAGNOSIS — I2510 Atherosclerotic heart disease of native coronary artery without angina pectoris: Secondary | ICD-10-CM | POA: Diagnosis not present

## 2016-10-30 NOTE — Progress Notes (Signed)
51 year old female referred for evaluation of varicose veins. The patient complains of burning and stinging in her right posterior thigh at the site of a recent greater saphenous vein harvesting for coronary artery bypass grafting. She also complains of burning numbness and tingling in both feet. She is known to us from her previous diagnosis of peripheral arterial disease. She previously has had a right common iliac stent right external iliac artery angioplasty in 2008 subsequently had a right-to-left femoral-femoral bypass which then subsequently occluded. It has been occluded for several years. She underwent coronary bypass grafting approximate 6 months ago. She has no left leg symptoms. She denies rest pain or ulcers on her feet. She did quit smoking for about 3 months after coronary bypass grafting but has resumed smoking about a pack a day. She was counseled against this today. She denies new prior history of DVT. She has no family history of varicose veins.  Review of systems: She has shortness of breath with exertion. She denies chest pain.  Physical exam:  Vitals:   10/30/16 1414  BP: 138/78  Pulse: 63  Resp: 20  Temp: 97.4 F (36.3 C)  TempSrc: Oral  SpO2: 97%  Weight: 144 lb 4.8 oz (65.5 kg)  Height: 5\' 2"  (1.575 m)    Extremities: Absent femoral popliteal pedal pulses bilaterally feet cool but well perfused in appearance  Skin: A few scattered 2-3 mm varicosities right medial calf  Data: Patient had a right leg venous reflux study today. This showed no evidence of DVT. She did still have a posterior branch of the right greater saphenous vein which measured about 2 mm in diameter  Assessment: Patient symptoms are more consistent with neuropathy then varicose veins. She has previously had the right greater saphenous vein harvested which would be the primary treatment for venous reflux in her right leg. Although she does have a posterior branch of the saphenous remaining it is  fairly small and I would not consider a laser ablation of this. She also has some pain over her saphenectomy site and may have some saphenous nerve irritation from her vein harvest. Hopefully this will improve with time.  Plan: The patient has follow-up scheduled for her peripheral arterial disease with us in October and we will obtain repeat ABIs with that office visit. Most likely her current symptoms are related to peripheral neuropathy. I discussed with her today the possibility of starting Neurontin or Lyrica. She was reluctant to start these medications currently. She will call us back if she wishes to start these in the future. Otherwise she will keep her scheduled follow-up in October.  Fabienne Brunsharles Fields, MD [facility] Office: [phone] Pager: [phone]

## 2017-02-13 ENCOUNTER — Other Ambulatory Visit: Payer: Self-pay | Admitting: *Deleted

## 2017-02-13 ENCOUNTER — Other Ambulatory Visit: Payer: Self-pay | Admitting: Cardiology

## 2017-03-26 ENCOUNTER — Encounter (HOSPITAL_COMMUNITY): Payer: Medicare Other

## 2017-03-26 ENCOUNTER — Ambulatory Visit: Payer: Medicare Other | Admitting: Family

## 2017-03-31 ENCOUNTER — Ambulatory Visit: Payer: Medicare Other | Admitting: Family

## 2017-05-14 ENCOUNTER — Encounter (HOSPITAL_COMMUNITY): Payer: Medicare Other

## 2017-05-14 ENCOUNTER — Ambulatory Visit: Payer: Medicare Other | Admitting: Family

## 2017-05-18 ENCOUNTER — Other Ambulatory Visit: Payer: Self-pay | Admitting: Cardiology

## 2017-06-14 ENCOUNTER — Other Ambulatory Visit: Payer: Self-pay | Admitting: Cardiology

## 2017-06-17 ENCOUNTER — Ambulatory Visit (INDEPENDENT_AMBULATORY_CARE_PROVIDER_SITE_OTHER): Payer: Medicare Other | Admitting: Cardiology

## 2017-06-17 ENCOUNTER — Encounter: Payer: Self-pay | Admitting: Cardiology

## 2017-06-17 VITALS — BP 117/73 | HR 60 | Ht 62.0 in | Wt 162.6 lb

## 2017-06-17 DIAGNOSIS — I209 Angina pectoris, unspecified: Secondary | ICD-10-CM

## 2017-06-17 DIAGNOSIS — Z789 Other specified health status: Secondary | ICD-10-CM | POA: Diagnosis not present

## 2017-06-17 DIAGNOSIS — Z720 Tobacco use: Secondary | ICD-10-CM

## 2017-06-17 DIAGNOSIS — I25119 Atherosclerotic heart disease of native coronary artery with unspecified angina pectoris: Secondary | ICD-10-CM

## 2017-06-17 DIAGNOSIS — R001 Bradycardia, unspecified: Secondary | ICD-10-CM | POA: Diagnosis not present

## 2017-06-17 DIAGNOSIS — E782 Mixed hyperlipidemia: Secondary | ICD-10-CM | POA: Diagnosis not present

## 2017-06-17 NOTE — Patient Instructions (Signed)
Medication Instructions:  Your physician recommends that you continue on your current medications as directed. Please refer to the Current Medication list given to you today.  Labwork: LIPIDS Fasting Orders given today  Testing/Procedures: NONE  Follow-Up: Your physician wants you to follow-up in: 6 MONTHS WITH DR.MCDOWELL You will receive a reminder letter in the mail two months in advance. If you don't receive a letter, please call our office to schedule the follow-up appointment.   Any Other Special Instructions Will Be Listed Below (If Applicable).  If you need a refill on your cardiac medications before your next appointment, please call your pharmacy. 

## 2017-06-17 NOTE — Progress Notes (Signed)
Cardiology Office Note  Date: 06/17/2017   ID: Kelsey Hansen, DOB [birth year], MRN [id number]  PCP: Patient, No Pcp Per  Primary Cardiologist: Nona Dell, MD   Chief Complaint  Patient presents with  . Coronary Artery Disease    History of Present Illness: Kelsey Hansen is a 52 y.o. female last seen in February 2018. She presents overdue for follow-up. Reports that she has not established with a PCP yet. From a cardiac perspective she denies angina symptoms or nitroglycerin use. She is still trying to quit smoking, has not been successful. She has NYHA class II dyspnea at baseline.  She did not present for follow-up testing arranged at the last visit including chest x-ray and lipid panel. We went over her medications, she has been taking Lopressor 25 mg only once in the morning rather than half pill divided twice daily. She has a history of statin intolerance. We did discuss obtaining a follow-up lipid panel to help guide further treatment options.  She was seen by Dr. Darrick Penna back in May. Lower extremity venous Dopplers were negative for DVT.  I personally reviewed her ECG today which shows sinus bradycardia with possible old anteroseptal infarct pattern.  Past Medical History:  Diagnosis Date  . Anxiety   . 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 Coronary atherosclerosis of native coronary artery    Multivessel  . Hyperlipidemia   . NSTEMI (non-ST elevated myocardial infarction) (HCC) 11/07   PTCA of distal circumflex 2007, residual 50% LAD and 80% PDA  . Peripheral arterial disease (HCC)    Right common and external iliac stent, right to left fem-fem bypass    Past Surgical History:  Procedure Laterality Date  . CARDIAC CATHETERIZATION  02/15/2016  . CARDIAC CATHETERIZATION N/A 02/15/2016   Procedure: Left Heart Cath and Coronary Angiography;  Surgeon: Lyn Records, MD;  Location: [facility] INVASIVE CV LAB;  Service:  Cardiovascular;  Laterality: N/A;  . CORONARY ARTERY BYPASS GRAFT N/A 02/20/2016   Procedure: CORONARY ARTERY BYPASS GRAFTING (CABG)x three,  using left internal mammary artery and right greater saphenous leg vein using endoscope.;  Surgeon: Kerin Perna, MD;  Location: [facility] OR;  Service: Open Heart Surgery;  Laterality: N/A;  . FEMORAL BYPASS  7/08   Right to left; followed by right external iliac artery stent placed 12/08  . PR VEIN BYPASS GRAFT,AORTO-FEM-POP    . SP [place] TRANSCATHETER OCCLUSION     Occlusion off her fem-fem bypass with claudication in both legs  . TEE WITHOUT CARDIOVERSION N/A 02/20/2016   Procedure: TRANSESOPHAGEAL ECHOCARDIOGRAM (TEE);  Surgeon: Kerin Perna, MD;  Location: [facility] OR;  Service: Open Heart Surgery;  Laterality: N/A;    Current Outpatient Medications  Medication Sig Dispense Refill  . acetaminophen (TYLENOL) 500 MG tablet Take 500 mg by mouth every 6 (six) hours as needed.    Marland Kitchen aspirin EC 81 MG tablet Take 81 mg by mouth daily.     . metoprolol tartrate (LOPRESSOR) 25 MG tablet TAKE 1/2 TABLET BY MOUTH TWICE DAILY 30 tablet 3  . nitroGLYCERIN (NITROSTAT) 0.4 MG SL tablet Place 0.4 mg under the tongue every 5 (five) minutes as needed for chest pain.    Marland Kitchen PLAVIX 75 MG tablet TAKE 1 TABLET BY MOUTH EVERY DAY 90 tablet 1   No current facility-administered medications for this visit.    Allergies:  Aleve [naproxen]; Crestor [rosuvastatin]; Lipitor [atorvastatin]; Pravachol [pravastatin]; Zocor [simvastatin]; Hydrocodone-acetaminophen; and  Oxycodone-acetaminophen   Social History: The patient  reports that she has been smoking cigarettes.  She started smoking about 42 years ago. She has a 15.00 pack-year smoking history. she has never used smokeless tobacco. She reports that she does not drink alcohol or use drugs.   ROS:  Please see the history of present illness. Otherwise, complete review of systems is positive for none.  All other systems are reviewed and  negative.   Physical Exam: VS:  BP 117/73   Pulse 60   Ht 5\' 2"  (1.575 m)   Wt 162 lb 9.6 oz (73.8 kg)   BMI 29.74 kg/m , BMI Body mass index is 29.74 kg/m.  Wt Readings from Last 3 Encounters:  06/17/17 162 lb 9.6 oz (73.8 kg)  10/30/16 144 lb 4.8 oz (65.5 kg)  08/07/16 139 lb 9.6 oz (63.3 kg)    General: Chronically ill-appearing woman, appears comfortable at rest. HEENT: Conjunctiva and lids normal, oropharynx clear. Neck: Supple, no elevated JVP or carotid bruits, no thyromegaly. Lungs: Clear to auscultation, nonlabored breathing at rest. Cardiac: Regular rate and rhythm, no S3 or significant systolic murmur, no pericardial rub. Abdomen: Soft, nontender, bowel sounds present. Extremities: No pitting edema, distal pulses 2+. Skin: Warm and dry. Musculoskeletal: No kyphosis. Neuropsychiatric: Alert and oriented x3, affect grossly appropriate.  ECG: I personally reviewed the tracing from 04/16/2016 which showed sinus rhythm with nonspecific ST-T changes.  Recent Labwork:    Component Value Date/Time   CHOL 232 (H) 05/03/2015 1110   TRIG 107 05/03/2015 1110   HDL 44 05/03/2015 1110   CHOLHDL 5.3 05/03/2015 1110   VLDL 21 05/03/2015 1110   LDLCALC 167 (H) 05/03/2015 1110   LDLDIRECT 96.7 10/19/2006 1506    Other Studies Reviewed Today:  Cardiac catheterization 02/15/2016:  Prox RCA-2 lesion, 75 %stenosed.  Prox RCA-1 lesion, 90 %stenosed.  Mid RCA-2 lesion, 85 %stenosed.  Mid RCA-1 lesion, 80 %stenosed.  Ost Ramus to Ramus lesion, 65 %stenosed.  Prox Cx to Mid Cx lesion, 75 %stenosed.  1st Diag lesion, 50 %stenosed.  Ost LAD lesion, 90 %stenosed.  The left ventricular ejection fraction is 55-65% by visual estimate.  The left ventricular systolic function is normal.  LV end diastolic pressure is normal.   Severe 2 vessel coronary disease involving the right coronary diffusely (relatively small vessel), and significant proximal LAD involvement. The  circumflex contains moderate coronary disease including the site of previous angioplasty in the mid vessel at the time of infarction in 2007. Ramus has intermediate stenosis.  Normal left ventricular systolic function. LVEF at least 60% with normal EDP.  Severe peripheral vascular disease with right iliac stents and left femoral-femoral bypass. Prior right brachial occlusion at the time of catheterization 2007 requiring thrombectomy by Dr. Bud FaceGregory Hayes.  Echocardiogram 02/16/2016: Study Conclusions  - Left ventricle: The cavity size was normal. Wall thickness was increased in a pattern of mild LVH. Systolic function was normal. The estimated ejection fraction was in the range of 60% to 65%. Wall motion was normal; there were no regional wall motion abnormalities. Left ventricular diastolic function parameters were normal. - Aortic valve: Trileaflet. Sclerosis without stenosis. There was mild regurgitation. - Left atrium: The atrium was normal in size. - Inferior vena cava: The vessel was normal in size. The respirophasic diameter changes were in the normal range (>= 50%), consistent with normal central venous pressure.  Impressions:  - LVEF 60-65%, mild LVH, normal wall motion, normal diastolic function, aortic valve sclerosis with mild  AI, normal LA size, normal IVC.  Assessment and Plan:  1. Multivessel CAD status post CABG in September 2017. She reports no active angina symptoms. Plan to continue aspirin and Plavix, she has nitroglycerin available. ECG reviewed.  2. Hyperlipidemia with statin intolerance. Follow-up fasting lipid panel. We have discussed other treatment options, she has been somewhat hesitant to consider the so far.  3. Ongoing tobacco abuse. We again discussed smoking cessation strategies today.  4. Sinus bradycardia. Not obviously symptomatic. May need to consider stopping Lopressor if her heart rate at rest is high 40s to low 50s on  the lowest dose of Lopressor however.  Current medicines were reviewed with the patient today.   Orders Placed This Encounter  Procedures  . Lipid Profile  . EKG 12-Lead    Disposition: Follow-up in 6 months.  Signed, Jonelle Sidle, MD, [facility] 06/17/2017 12:19 PM    [facility] at [facility] [address] [place], [place], [place] [address] Phone: [phone]; Fax: [phone]

## 2017-06-23 ENCOUNTER — Telehealth: Payer: Self-pay

## 2017-06-23 MED ORDER — EZETIMIBE 10 MG PO TABS: 10.0000 mg | ORAL_TABLET | Freq: Every day | ORAL | 3 refills | Status: DC

## 2017-06-23 NOTE — Telephone Encounter (Signed)
[facility], at advice from Dr.McDowell.Patient agrees to try Zetia 10 mg daily.I have e-scribed 30 day supply at patient request.If she tolerates, she may have 90 day supply.Patient understands this is not a statin

## 2017-07-13 ENCOUNTER — Ambulatory Visit (INDEPENDENT_AMBULATORY_CARE_PROVIDER_SITE_OTHER): Payer: Medicare Other | Admitting: Family

## 2017-07-13 ENCOUNTER — Ambulatory Visit (HOSPITAL_COMMUNITY)
Admission: RE | Admit: 2017-07-13 | Discharge: 2017-07-13 | Disposition: A | Discharge status: Home / Self Care | Payer: Medicare Other | Source: Ambulatory Visit | Attending: Vascular Surgery | Admitting: Vascular Surgery

## 2017-07-13 ENCOUNTER — Ambulatory Visit (INDEPENDENT_AMBULATORY_CARE_PROVIDER_SITE_OTHER)
Admission: RE | Admit: 2017-07-13 | Discharge: 2017-07-13 | Disposition: A | Discharge status: Home / Self Care | Payer: Medicare Other | Source: Ambulatory Visit | Attending: Vascular Surgery | Admitting: Vascular Surgery

## 2017-07-13 ENCOUNTER — Encounter: Payer: Self-pay | Admitting: Family

## 2017-07-13 VITALS — BP 147/82 | HR 65 | Temp 97.7°F | Resp 18 | Ht 62.0 in | Wt 159.0 lb

## 2017-07-13 DIAGNOSIS — R9389 Abnormal findings on diagnostic imaging of other specified body structures: Secondary | ICD-10-CM | POA: Insufficient documentation

## 2017-07-13 DIAGNOSIS — I6523 Occlusion and stenosis of bilateral carotid arteries: Secondary | ICD-10-CM

## 2017-07-13 DIAGNOSIS — E785 Hyperlipidemia, unspecified: Secondary | ICD-10-CM | POA: Insufficient documentation

## 2017-07-13 DIAGNOSIS — Z720 Tobacco use: Secondary | ICD-10-CM | POA: Diagnosis not present

## 2017-07-13 DIAGNOSIS — I2510 Atherosclerotic heart disease of native coronary artery without angina pectoris: Secondary | ICD-10-CM | POA: Diagnosis not present

## 2017-07-13 DIAGNOSIS — I739 Peripheral vascular disease, unspecified: Secondary | ICD-10-CM | POA: Insufficient documentation

## 2017-07-13 DIAGNOSIS — I10 Essential (primary) hypertension: Secondary | ICD-10-CM | POA: Diagnosis not present

## 2017-07-13 DIAGNOSIS — I779 Disorder of arteries and arterioles, unspecified: Secondary | ICD-10-CM

## 2017-07-13 LAB — VAS US CAROTID
LCCADDIAS: -22 cm/s
LCCADSYS: -65 cm/s
LEFT ECA DIAS: -20 cm/s
LEFT VERTEBRAL DIAS: 28 cm/s
LICADDIAS: -36 cm/s
LICADSYS: -133 cm/s
LICAPSYS: -100 cm/s
Left CCA prox dias: 24 cm/s
Left CCA prox sys: 130 cm/s
Left ICA prox dias: -42 cm/s
RIGHT CCA MID DIAS: 22 cm/s
RIGHT ECA DIAS: -18 cm/s
RIGHT VERTEBRAL DIAS: 43 cm/s
Right CCA prox dias: 21 cm/s
Right CCA prox sys: 102 cm/s
Right cca dist sys: -112 cm/s

## 2017-07-13 MED ORDER — GABAPENTIN 300 MG PO CAPS: 300.0000 mg | ORAL_CAPSULE | Freq: Every day | ORAL | 0 refills | Status: DC

## 2017-07-13 NOTE — Patient Instructions (Signed)
Steps to Quit Smoking Smoking tobacco can be bad for your health. It can also affect almost every organ in your body. Smoking puts you and people around you at risk for many serious long-lasting (chronic) diseases. Quitting smoking is hard, but it is one of the best things that you can do for your health. It is never too late to quit. What are the benefits of quitting smoking? When you quit smoking, you lower your risk for getting serious diseases and conditions. They can include:  Lung cancer or lung disease.  Heart disease.  Stroke.  Heart attack.  Not being able to have children (infertility).  Weak bones (osteoporosis) and broken bones (fractures).  If you have coughing, wheezing, and shortness of breath, those symptoms may get better when you quit. You may also get sick less often. If you are pregnant, quitting smoking can help to lower your chances of having a baby of low birth weight. What can I do to help me quit smoking? Talk with your doctor about what can help you quit smoking. Some things you can do (strategies) include:  Quitting smoking totally, instead of slowly cutting back how much you smoke over a period of time.  Going to in-person counseling. You are more likely to quit if you go to many counseling sessions.  Using resources and support systems, such as: ? Online chats with a counselor. ? Phone quitlines. ? Printed self-help materials. ? Support groups or group counseling. ? Text messaging programs. ? Mobile phone apps or applications.  Taking medicines. Some of these medicines may have nicotine in them. If you are pregnant or breastfeeding, do not take any medicines to quit smoking unless your doctor says it is okay. Talk with your doctor about counseling or other things that can help you.  Talk with your doctor about using more than one strategy at the same time, such as taking medicines while you are also going to in-person counseling. This can help make  quitting easier. What things can I do to make it easier to quit? Quitting smoking might feel very hard at first, but there is a lot that you can do to make it easier. Take these steps:  Talk to your family and friends. Ask them to support and encourage you.  Call phone quitlines, reach out to support groups, or work with a counselor.  Ask people who smoke to not smoke around you.  Avoid places that make you want (trigger) to smoke, such as: ? Bars. ? Parties. ? Smoke-break areas at work.  Spend time with people who do not smoke.  Lower the stress in your life. Stress can make you want to smoke. Try these things to help your stress: ? Getting regular exercise. ? Deep-breathing exercises. ? Yoga. ? Meditating. ? Doing a body scan. To do this, close your eyes, focus on one area of your body at a time from head to toe, and notice which parts of your body are tense. Try to relax the muscles in those areas.  Download or buy apps on your mobile phone or tablet that can help you stick to your quit plan. There are many free apps, such as QuitGuide from the CDC (Centers for Disease Control and Prevention). You can find more support from smokefree.gov and other websites.  This information is not intended to replace advice given to you by your health care provider. Make sure you discuss any questions you have with your health care provider. Document Released: 03/29/2009 Document   Revised: 01/29/2016 Document Reviewed: 10/17/2014 Elsevier Interactive Patient Education  2018 Elsevier Inc.     Peripheral Vascular Disease Peripheral vascular disease (PVD) is a disease of the blood vessels that are not part of your heart and brain. A simple term for PVD is poor circulation. In most cases, PVD narrows the blood vessels that carry blood from your heart to the rest of your body. This can result in a decreased supply of blood to your arms, legs, and internal organs, like your stomach or kidneys.  However, it most often affects a person's lower legs and feet. There are two types of PVD.  Organic PVD. This is the more common type. It is caused by damage to the structure of blood vessels.  Functional PVD. This is caused by conditions that make blood vessels contract and tighten (spasm).  Without treatment, PVD tends to get worse over time. PVD can also lead to acute ischemic limb. This is when an arm or limb suddenly has trouble getting enough blood. This is a medical emergency. Follow these instructions at home:  Take medicines only as told by your doctor.  Do not use any tobacco products, including cigarettes, chewing tobacco, or electronic cigarettes. If you need help quitting, ask your doctor.  Lose weight if you are overweight, and maintain a healthy weight as told by your doctor.  Eat a diet that is low in fat and cholesterol. If you need help, ask your doctor.  Exercise regularly. Ask your doctor for some good activities for you.  Take good care of your feet. ? Wear comfortable shoes that fit well. ? Check your feet often for any cuts or sores. Contact a doctor if:  You have cramps in your legs while walking.  You have leg pain when you are at rest.  You have coldness in a leg or foot.  Your skin changes.  You are unable to get or have an erection (erectile dysfunction).  You have cuts or sores on your feet that are not healing. Get help right away if:  Your arm or leg turns cold and blue.  Your arms or legs become red, warm, swollen, painful, or numb.  You have chest pain or trouble breathing.  You suddenly have weakness in your face, arm, or leg.  You become very confused or you cannot speak.  You suddenly have a very bad headache.  You suddenly cannot see. This information is not intended to replace advice given to you by your health care provider. Make sure you discuss any questions you have with your health care provider. Document Released:  08/27/2009 Document Revised: 11/08/2015 Document Reviewed: 11/10/2013 Elsevier Interactive Patient Education  2017 Elsevier Inc.     Stroke Prevention Some health problems and behaviors may make it more likely for you to have a stroke. Below are ways to lessen your risk of having a stroke.  Be active for at least 30 minutes on most or all days.  Do not smoke. Try not to be around others who smoke.  Do not drink too much alcohol. ? Do not have more than 2 drinks a day if you are a man. ? Do not have more than 1 drink a day if you are a woman and are not pregnant.  Eat healthy foods, such as fruits and vegetables. If you were put on a specific diet, follow the diet as told.  Keep your cholesterol levels under control through diet and medicines. Look for foods that are low in   saturated fat, trans fat, cholesterol, and are high in fiber.  If you have diabetes, follow all diet plans and take your medicine as told.  Ask your doctor if you need treatment to lower your blood pressure. If you have high blood pressure (hypertension), follow all diet plans and take your medicine as told by your doctor.  If you are 18-39 years old, have your blood pressure checked every 3-5 years. If you are age 40 or older, have your blood pressure checked every year.  Keep a healthy weight. Eat foods that are low in calories, salt, saturated fat, trans fat, and cholesterol.  Do not take drugs.  Avoid birth control pills, if this applies. Talk to your doctor about the risks of taking birth control pills.  Talk to your doctor if you have sleep problems (sleep apnea).  Take all medicine as told by your doctor. ? You may be told to take aspirin or blood thinner medicine. Take this medicine as told by your doctor. ? Understand your medicine instructions.  Make sure any other conditions you have are being taken care of.  Get help right away if:  You suddenly lose feeling (you feel numb) or have weakness  in your face, arm, or leg.  Your face or eyelid hangs down to one side.  You suddenly feel confused.  You have trouble talking (aphasia) or understanding what people are saying.  You suddenly have trouble seeing in one or both eyes.  You suddenly have trouble walking.  You are dizzy.  You lose your balance or your movements are clumsy (uncoordinated).  You suddenly have a very bad headache and you do not know the cause.  You have new chest pain.  Your heart feels like it is fluttering or skipping a beat (irregular heartbeat). Do not wait to see if the symptoms above go away. Get help right away. Call your local emergency services (911 in [place] Do not drive yourself to the hospital. This information is not intended to replace advice given to you by your health care provider. Make sure you discuss any questions you have with your health care provider. Document Released: 12/02/2011 Document Revised: 11/08/2015 Document Reviewed: 12/03/2012 Elsevier Interactive Patient Education  2018 Elsevier Inc.  

## 2017-07-13 NOTE — Progress Notes (Signed)
[facility] [place]   CC: Follow up peripheral artery occlusive disease and extracranial carotid artery stenosis   History of Present Illness Kelsey Hansen is a 52 y.o. female who is s/p right common iliac artery and  Right external iliac artery percutaneous transluminal angioplasty with stent placement December 2008 by  Dr. Madilyn FiremanHayes and Dr. 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  She does not have claudication sx's in her legs unless she walks a couple of miles, but climbing stairs the posterior aspect of both her legs hurt and feet tingle.   She states she is taking 3 tabs of acetaminophen, 650 mg each tab, every 3-4 hours. I advised pt to stop taking acetaminophen and talk with her PCP re this.  I offered to refer her to pain management, she declined, stated she would speak with her PCP.   She has pain on soles of feet, chronic numbness on medial aspects both thighs since the 2008 fem-fem bypass, denies non healing ulcers on legs. She complains of pain at the distal tip of her right scapula since cardiac cath was done in September 2017, access via the right radial artery.    She had a large MI in 2007, denies any known hx of strokes or TIA's.  Dr. Darrick PennaFields last evaluated pt on 10-30-16. At that time Dr. Darrick PennaFields indicated that patient symptoms are more consistent with neuropathy then varicose veins. She has previously had the right greater saphenous vein harvested which would be the primary treatment for venous reflux in her right leg. Although she does have a posterior branch of the saphenous remaining it is fairly small and Dr. Darrick PennaFields would not consider a laser ablation of this. She also has some pain over her saphenectomy site and may have some saphenous nerve irritation from her vein harvest. Hopefully this will improve with time. The patient has follow-up scheduled for her peripheral arterial disease with us in October 2018 and we will  obtain repeat ABIs with that office visit. Most likely her current symptoms are related to peripheral neuropathy. Dr. Darrick PennaFields discussed with her the possibility of starting Neurontin or Lyrica. She was reluctant to start these medications currently. She was to call us if she wishes to start these in the future. Otherwise she will keep her scheduled follow-up in October 2018.  Pt did not return until today, 07-13-17. She is seeking relief from the pain in her legs which is worse at night and keeps her awake, and RLS at night. She has no claudication sx's in her legs until she has walked about 2 miles.   Pt Diabetic: No Pt smoker: smoker  (1/2 ppd x 43 yrs)  Pt meds include: Statin :No, was taking red yeast rice; states she had arthralgias with Lipitor and Crestor Betablocker: Yes ASA: Yes Other anticoagulants/antiplatelets: Plavix   Past Medical History:  Diagnosis Date  . Anxiety   . 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 Coronary atherosclerosis of native coronary artery    Multivessel  . Hyperlipidemia   . NSTEMI (non-ST elevated myocardial infarction) (HCC) 11/07   PTCA of distal circumflex 2007, residual 50% LAD and 80% PDA  . Peripheral arterial disease (HCC)    Right common and external iliac stent, right to left fem-fem bypass    Social History Social History   Tobacco Use  . Smoking status: Current Every Day Smoker    Packs/day: 0.25  Years: 30.00    Pack years: 7.50    Types: Cigarettes    Start date: 01/27/1975  . Smokeless tobacco: Never Used  . Tobacco comment: 10 ciggs per day/quit 02/13/2016 but re-started back-restarted smoking 05/2016  Substance Use Topics  . Alcohol use: No  . Drug use: No    Family History Family History  Problem Relation Age of Onset  . Cancer Mother        Lung  . Heart disease Mother        Heart Disease before age 44  . Heart attack Mother 41  . Deep vein thrombosis  Father   . Arthritis Father        Back  . Varicose Veins Father   . Cancer Sister        Breast  . Diabetes Neg Hx   . Hypertension Neg Hx   . Coronary artery disease Neg Hx     Past Surgical History:  Procedure Laterality Date  . CARDIAC CATHETERIZATION  02/15/2016  . CARDIAC CATHETERIZATION N/A 02/15/2016   Procedure: Left Heart Cath and Coronary Angiography;  Surgeon: Lyn Records, MD;  Location: [facility] INVASIVE CV LAB;  Service: Cardiovascular;  Laterality: N/A;  . CORONARY ARTERY BYPASS GRAFT N/A 02/20/2016   Procedure: CORONARY ARTERY BYPASS GRAFTING (CABG)x three,  using left internal mammary artery and right greater saphenous leg vein using endoscope.;  Surgeon: Kerin Perna, MD;  Location: [facility] OR;  Service: Open Heart Surgery;  Laterality: N/A;  . FEMORAL BYPASS  7/08   Right to left; followed by right external iliac artery stent placed 12/08  . PR VEIN BYPASS GRAFT,AORTO-FEM-POP    . SP [place] TRANSCATHETER OCCLUSION     Occlusion off her fem-fem bypass with claudication in both legs  . TEE WITHOUT CARDIOVERSION N/A 02/20/2016   Procedure: TRANSESOPHAGEAL ECHOCARDIOGRAM (TEE);  Surgeon: Kerin Perna, MD;  Location: [facility] OR;  Service: Open Heart Surgery;  Laterality: N/A;    Allergies  Allergen Reactions  . Aleve [Naproxen] Palpitations  . Crestor [Rosuvastatin] Other (See Comments)    Joint & muscle aches and pain  . Lipitor [Atorvastatin] Other (See Comments)    Joint & muscle aches and pain  . Pravachol [Pravastatin] Other (See Comments)    Joint & muscle aches and pain  . Zocor [Simvastatin] Other (See Comments)    Joint & muscle aches and pain  . Hydrocodone-Acetaminophen Nausea And Vomiting  . Oxycodone-Acetaminophen Nausea And Vomiting    Current Outpatient Medications  Medication Sig Dispense Refill  . acetaminophen (TYLENOL) 500 MG tablet Take 500 mg by mouth every 6 (six) hours as needed.    Marland Kitchen aspirin EC 81 MG tablet Take 81 mg by mouth daily.     . metoprolol  tartrate (LOPRESSOR) 25 MG tablet TAKE 1/2 TABLET BY MOUTH TWICE DAILY 30 tablet 3  . nitroGLYCERIN (NITROSTAT) 0.4 MG SL tablet Place 0.4 mg under the tongue every 5 (five) minutes as needed for chest pain.    Marland Kitchen PLAVIX 75 MG tablet TAKE 1 TABLET BY MOUTH EVERY DAY 90 tablet 1  . ezetimibe (ZETIA) 10 MG tablet Take 1 tablet (10 mg total) by mouth daily. (Patient not taking: Reported on 07/13/2017) 30 tablet 3   No current facility-administered medications for this visit.     ROS: See HPI for pertinent positives and negatives.   Physical Examination  Vitals:   07/13/17 1543 07/13/17 1549  BP: 129/76 (!) 147/82  Pulse: 65   Resp:  18   Temp: 97.7 F (36.5 C)   TempSrc: Oral   SpO2: 97%   Weight: 159 lb (72.1 kg)   Height: 5\' 2"  (1.575 m)    Body mass index is 29.08 kg/m.                                                                                                                                          General: A&O x 3, WDWN female. Gait: normal HENT: No gross abnormalities. Edentulous Eyes: PERRLA. Pulmonary: Respirations are non labored, CTAB, good air movement in all fields Cardiac: regular rhythm, no detected murmur.         Carotid Bruits Right Left   Negative Negative   Radial pulses are 1+ palpable bilaterally   Adominal aortic pulse is not palpable                         VASCULAR EXAM: Extremities without ischemic changes, without Gangrene; without open wounds. Toes of both feet are pink with brisk capillary refill.                                                                                                           LE Pulses Right Left       FEMORAL  1+ palpable  not palpable        POPLITEAL  not palpable   not palpable       POSTERIOR TIBIAL  not palpable   not palpable        DORSALIS PEDIS      ANTERIOR TIBIAL not palpable  not palpable    Abdomen: soft, NT, no palpable masses. Skin: no rashes, no cellulitis, no ulcers  noted. Musculoskeletal: no muscle wasting or atrophy.  Neurologic: A&O X 3; appropriate affect, Sensation is normal; MOTOR FUNCTION:  moving all extremities equally, motor strength 5/5 throughout. Speech is fluent/normal. CN 2-12 intact. +SLR bilaterally: elicits pain in groin and hip.  Psychiatric: Thought content is normal, mood appropriate for clinical situation, somewhat anxious.     ASSESSMENT: SHIVONNE SCHWARTZMAN is a 52 y.o. 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  Positive straight leg raise test bilaterally: elicits pain in groin and hip. She has no known back problems.     She has  no claudication sx's in her legs until walking 2 miles, pain in her legs is worse at night.  All toes of both feet are pink with brisk capillary refill.  She has developed collateral arterial perfusion.    She does not have DM. Her primary atherosclerotic risk factor is continued smoking x 43 years. She is also statin intolerant.   She states she is taking 3 tabs of acetaminophen, 650 mg each tab, every 3-4 hours to help the pain in her legs which is not due to mild and moderate lack of arterial perfusion.  I advised pt to stop taking acetaminophen and talk with her PCP re this.  I offered to refer her to pain management, she declined at this time, stated she would speak with her PCP.    Prescribed gabapentin to address painful neuropathy of her legs that keeps her awake at night: 300 mg po qHS, disp #30, 0 refills. Can add daytime doses at her next visit if effective.  See Plan.   DATA  ABI (Date: 07/13/2017):  R:   ABI: 0.88 (was 0.93 on 10-30-16),   PT: mono  DP: mono  TBI:  0.48 (was 0.71)  L:   ABI: 0.70 (was 0.73),   PT: mono  DP: mono  TBI: 0.29 (was 0.58) Decline in bilateral ABI and TBI: mild disease in the right,  moderate in the left. All monophasic waveforms.    PLAN:  Based on the patient's vascular studies and examination, pt will return to clinic in 3 weeks, see Dr. Darrick Penna to evaluate efficacy of gabapentin, discuss pain at distal aspect of right scapula since the heart cath via right radial artery in September 2017 (? thoracic outlet syndrome?), see if referral to pain management is necessary and acceptable to pt, and if pt needs referral to spine specialist to evaluate some sx's suggestive of lumbar spine disease.  I advised pt to stop the Tylenol and see her PCP ASAP to check her liver function.   The patient was counseled re smoking cessation and given several free resources re smoking cessation.  I discussed in depth with the patient the nature of atherosclerosis, and emphasized the importance of maximal medical management including strict control of blood pressure, blood glucose, and lipid levels, obtaining regular exercise, and cessation of smoking.  The patient is aware that without maximal medical management the underlying atherosclerotic disease process will progress, limiting the benefit of any interventions.  The patient was given information about PAD including signs, symptoms, treatment, what symptoms should prompt the patient to seek immediate medical care, and risk reduction measures to take.  Charisse March, RN, MSN, FNP-C [facility] [place] Phone: [phone]  Clinic MD: Myra Gianotti  07/13/17 3:59 PM

## 2017-08-06 ENCOUNTER — Ambulatory Visit (INDEPENDENT_AMBULATORY_CARE_PROVIDER_SITE_OTHER): Payer: Medicare Other | Admitting: Vascular Surgery

## 2017-08-06 ENCOUNTER — Other Ambulatory Visit: Payer: Self-pay

## 2017-08-06 ENCOUNTER — Encounter: Payer: Self-pay | Admitting: Vascular Surgery

## 2017-08-06 VITALS — BP 136/75 | HR 60 | Temp 98.6°F | Resp 16 | Ht 62.0 in | Wt 161.0 lb

## 2017-08-06 DIAGNOSIS — I739 Peripheral vascular disease, unspecified: Secondary | ICD-10-CM | POA: Diagnosis not present

## 2017-08-06 DIAGNOSIS — I779 Disorder of arteries and arterioles, unspecified: Secondary | ICD-10-CM

## 2017-08-06 MED ORDER — GABAPENTIN 300 MG PO CAPS: 300.0000 mg | ORAL_CAPSULE | Freq: Three times a day (TID) | ORAL | 1 refills | Status: DC

## 2017-08-06 NOTE — Progress Notes (Signed)
Patient is a 52 year old female with known peripheral arterial occlusive disease.  She previously underwent right common iliac and external iliac artery stenting by Dr. Madilyn FiremanHayes in 2008.  She has also previously had a femoral-femoral bypass which has been chronically occluded.  She primarily has complaints of numbness and tingling in both lower extremities.  She continues to smoke.  She does not really describe claudication symptoms.  She was recently seen by our nurse practitioner about 2 weeks ago and started on Neurontin to try to improve the numbness and tingling symptoms in her feet.  She states she has not really noticed any change.  She asked today about whether or not CBD oil would help.  I discussed with her that there is no clinical scientific evidence suggesting that it would.  She does not describe rest pain.  She also has a history of carotid occlusive disease and has been getting intermittent surveillance carotid duplex scans.  She denies any symptoms of TIA amaurosis or stroke.  She complains of pain at the tip of her right scapula.  She states this is been present for about 10 years or more since her previous cardiac catheterization.   Past Medical History:  Diagnosis Date  . Anxiety   . 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 Coronary atherosclerosis of native coronary artery    Multivessel  . Hyperlipidemia   . NSTEMI (non-ST elevated myocardial infarction) (HCC) 11/07   PTCA of distal circumflex 2007, residual 50% LAD and 80% PDA  . Peripheral arterial disease (HCC)    Right common and external iliac stent, right to left fem-fem bypass   Past Surgical History:  Procedure Laterality Date  . CARDIAC CATHETERIZATION  02/15/2016  . CARDIAC CATHETERIZATION N/A 02/15/2016   Procedure: Left Heart Cath and Coronary Angiography;  Surgeon: Lyn RecordsHenry W Smith, MD;  [facility];  Service: Cardiovascular;  Laterality: N/A;   . CORONARY ARTERY BYPASS GRAFT N/A 02/20/2016   Procedure: CORONARY ARTERY BYPASS GRAFTING (CABG)x three,  using left internal mammary artery and right greater saphenous leg vein using endoscope.;  Surgeon: Kerin PernaPeter Van Trigt, MD;  [facility];  Service: Open Heart Surgery;  Laterality: N/A;  . FEMORAL BYPASS  7/08   Right to left; followed by right external iliac artery stent placed 12/08  . PR VEIN BYPASS GRAFT,AORTO-FEM-POP    . SP US TRANSCATHETER OCCLUSION     Occlusion off her fem-fem bypass with claudication in both legs  . TEE WITHOUT CARDIOVERSION N/A 02/20/2016   Procedure: TRANSESOPHAGEAL ECHOCARDIOGRAM (TEE);  Surgeon: Kerin PernaPeter Van Trigt, MD;  [facility];  Service: Open Heart Surgery;  Laterality: N/A;     Current Outpatient Medications on File Prior to Visit  Medication Sig Dispense Refill  . acetaminophen (TYLENOL) 500 MG tablet Take 500 mg by mouth every 6 (six) hours as needed.    Marland Kitchen. aspirin EC 81 MG tablet Take 81 mg by mouth daily.     . metoprolol tartrate (LOPRESSOR) 25 MG tablet TAKE 1/2 TABLET BY MOUTH TWICE DAILY 30 tablet 3  . nitroGLYCERIN (NITROSTAT) 0.4 MG SL tablet Place 0.4 mg under the tongue every 5 (five) minutes as needed for chest pain.    Marland Kitchen. PLAVIX 75 MG tablet TAKE 1 TABLET BY MOUTH EVERY DAY 90 tablet 1   No current facility-administered medications on file prior to visit.    Physical exam:  Vitals:   08/06/17 1606  BP: 136/75  Pulse: 60  Resp: 16  Temp: 98.6 F (37 C)  TempSrc: Oral  SpO2: 94%  Weight: 161 lb (73 kg)  Height: 5\' 2"  (1.575 m)    Extremities: No palpable pedal pulses  Skin: No ulcerations  Neck: No carotid bruits  Data: Right ABI 0.8 left ABI 0.7 from July 13, 2017.  40-60% internal carotid artery stenosis.  Assessment: No real significant improvement on low-dose Neurontin for her neuropathic symptoms.  She has adequate perfusion to both lower extremities currently and I would not consider a redo operation in her  unless she was at risk of limb loss.  Especially in light of the fact that she continues to smoke.  She has no symptoms from her moderate carotid stenosis bilaterally.  Plan: #1 follow-up with our nurse practitioner in 6 months time with repeat ABIs and carotid duplex scan.  #2 I increased her Neurontin to 300 mg 3 times daily today to see if she gets any symptomatic improvement.  I discussed with the patient that her long-term chronic pain management would need to be monitored by a primary care physician including her Neurontin.  If she has no significant benefit from this dosing then I would discontinue the Neurontin.  #3 although our nurse practitioner entertained whether or not the right shoulder pain may be secondary to thoracic outlet syndrome the patient exhibits no symptoms consistent with this in history or physical exam.  Chronic right shoulder plan of unknown etiology.  Fabienne Bruns, MD [facility] Office: [phone] Pager: [phone]

## 2017-08-24 IMAGING — NM NM MYOCAR MULTI W/SPECT W/WALL MOTION & EF
1 series · 6 of 6 positions shown · non-contrast
Comparison: none

[Series 1: rest · 8.28mm/px · 6 of 64 frames shown]
[frame 6/64]
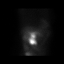
[frame 16/64]
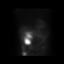
[frame 27/64]
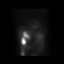
[frame 38/64]
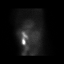
[frame 48/64]
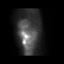
[frame 59/64]
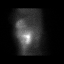

[6 of 6 positions shown; findings below may reference images not displayed]

Canned report from images found in remote index.

Refer to host system for actual result text.

## 2017-08-27 IMAGING — CT CT CHEST W/ CM
2 of 3 series · 15 of 36 positions shown, 18 images · IV contrast (iopamidol)
Comparison: None

CLINICAL DATA: Ongoing chest pain for few days, known coronary
artery disease post MI and with planned 3 vessel CABG

EXAM:
CT CHEST WITH CONTRAST
TECHNIQUE: Multidetector CT imaging of the chest was performed during
intravenous contrast administration. Sagittal and coronal MPR images
reconstructed from axial data set.
CONTRAST:  75mL [id number] IOPAMIDOL ([id number]) INJECTION 61% IV

[Series 3: thorax · axial · 0.65mm/px · z∈[-669,-439]mm · 12 of 136 slices shown, 15 images]
[im 11/136  mediastinal]
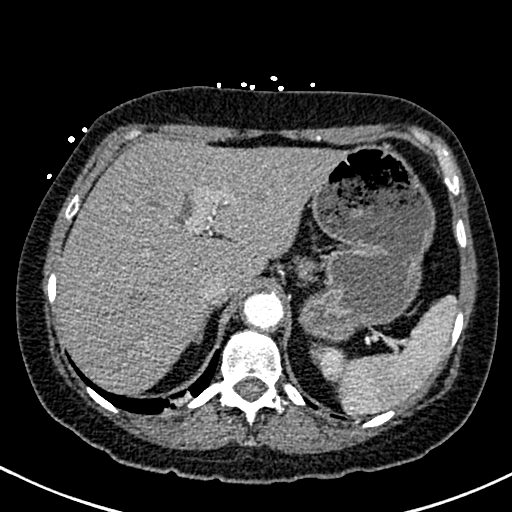
[im 11/136  lung]
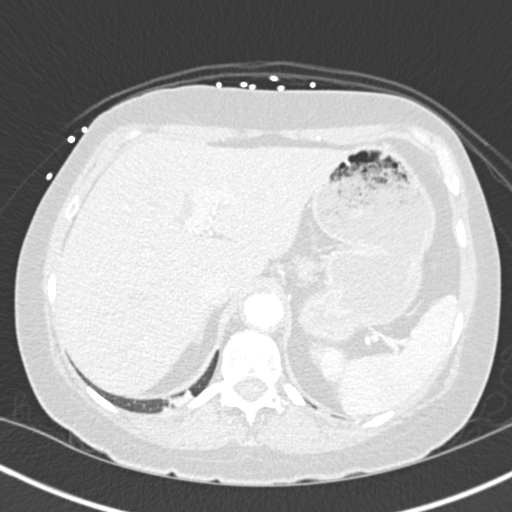
[im 21/136  lung]
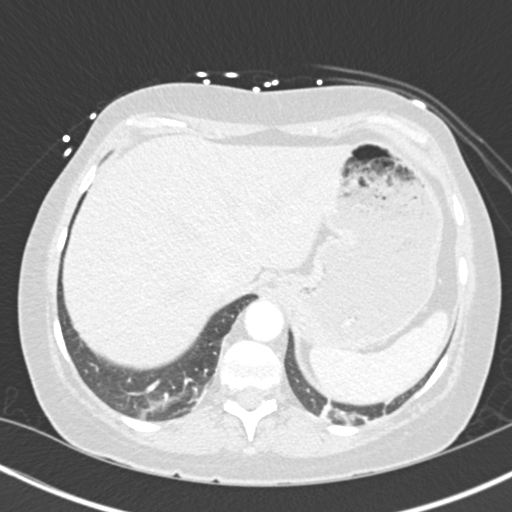
[im 31/136  lung]
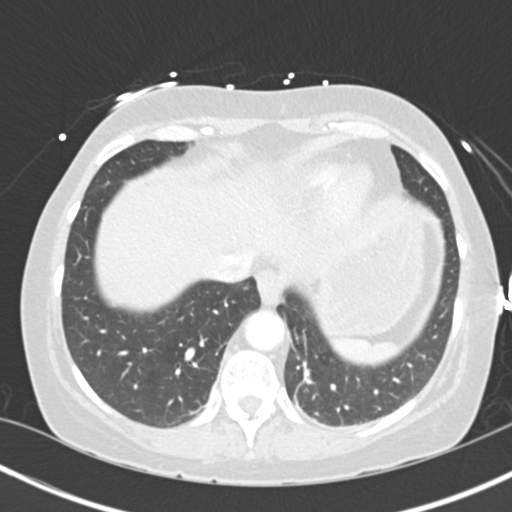
[im 41/136  lung]
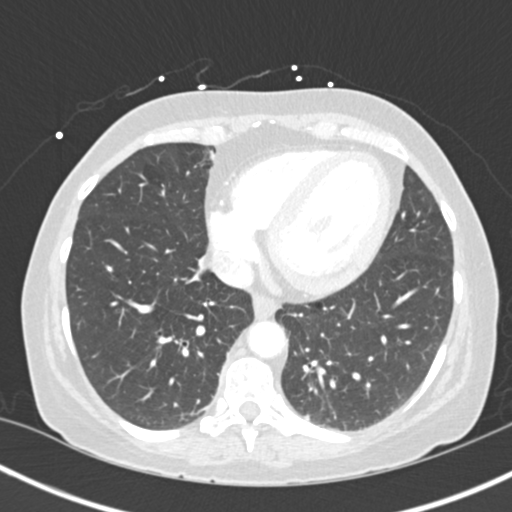
[im 51/136  mediastinal]
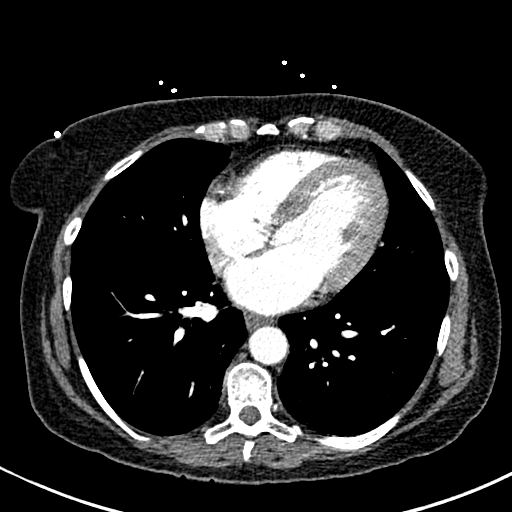
[im 51/136  lung]
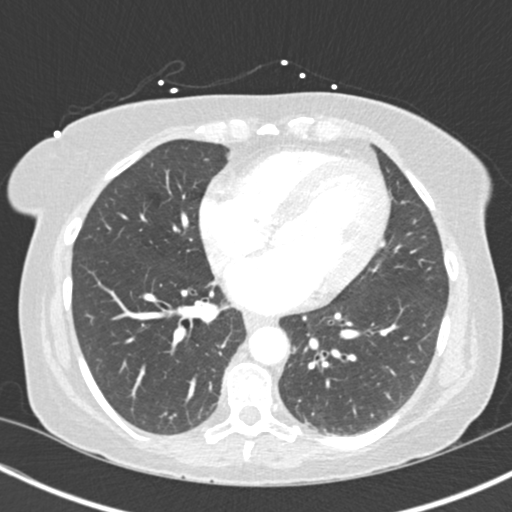
[im 61/136  lung]
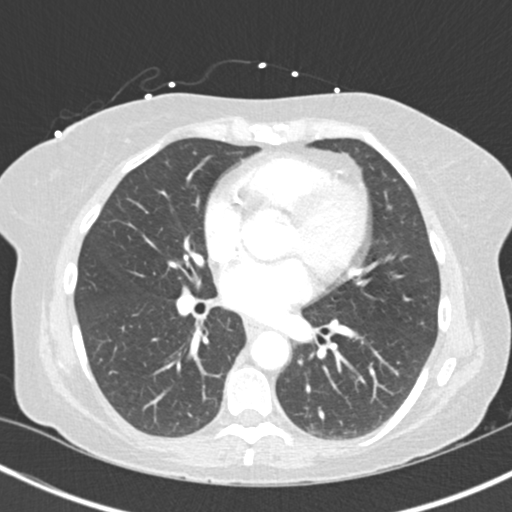
[im 76/136  lung]
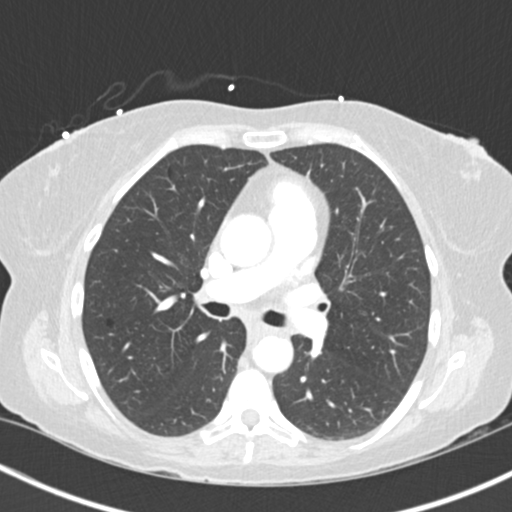
[im 86/136  lung]
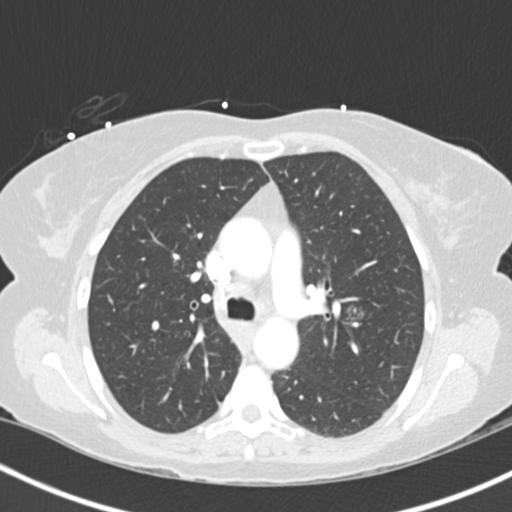
[im 96/136  mediastinal]
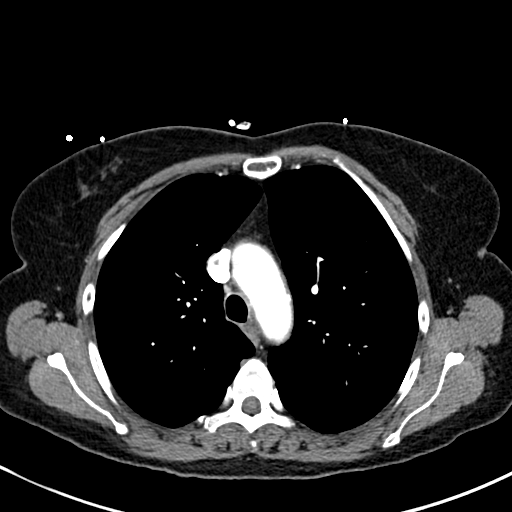
[im 96/136  lung]
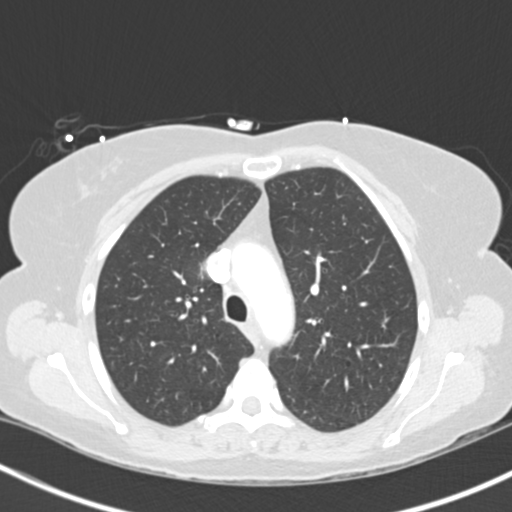
[im 106/136  lung]
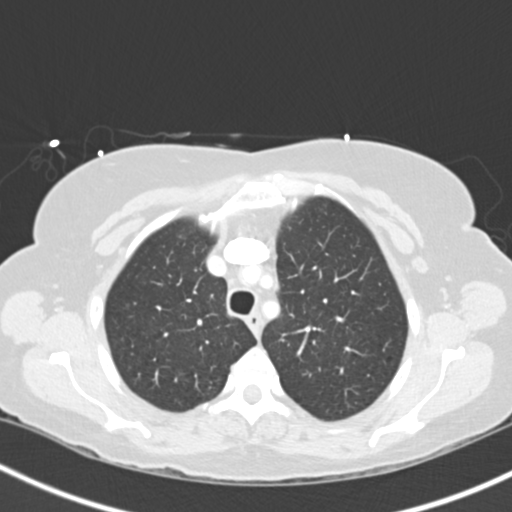
[im 116/136  lung]
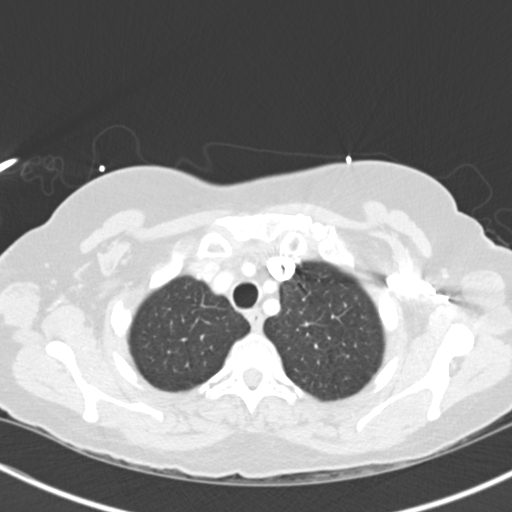
[im 126/136  lung]
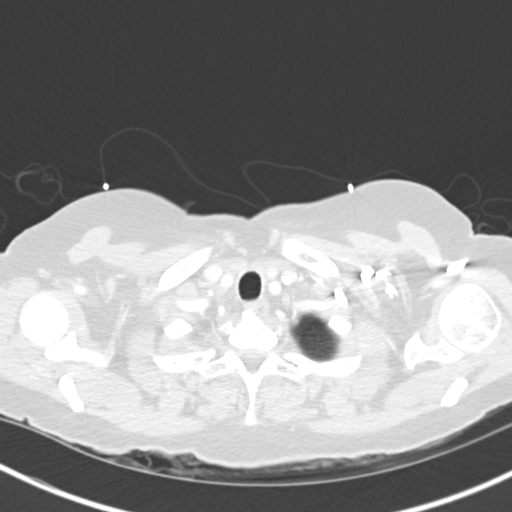

[Series 5: coronal · coronal · 0.57mm/px · 3 of 113 slices shown]
[im 23/113  lung]
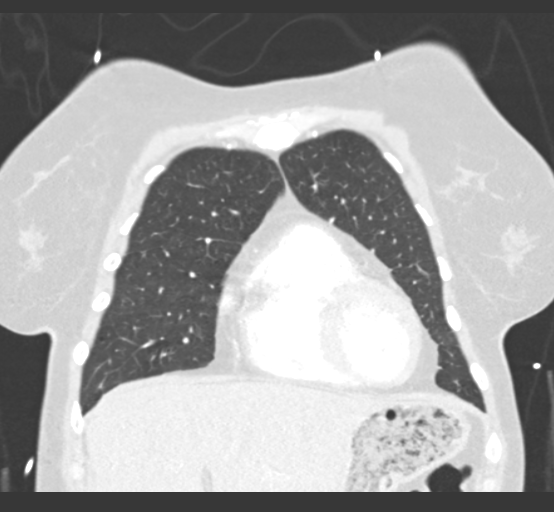
[im 45/113  lung]
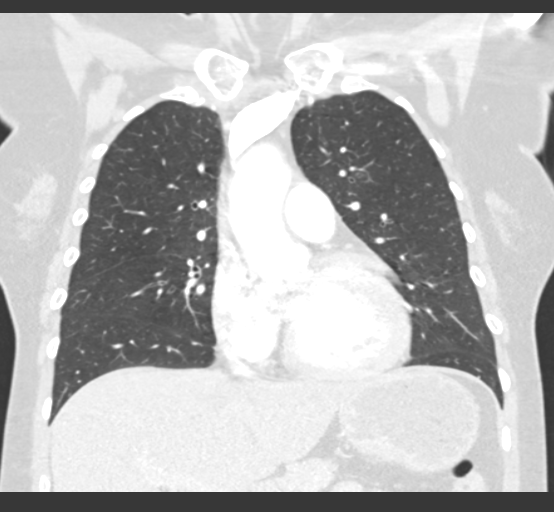
[im 68/113  lung]
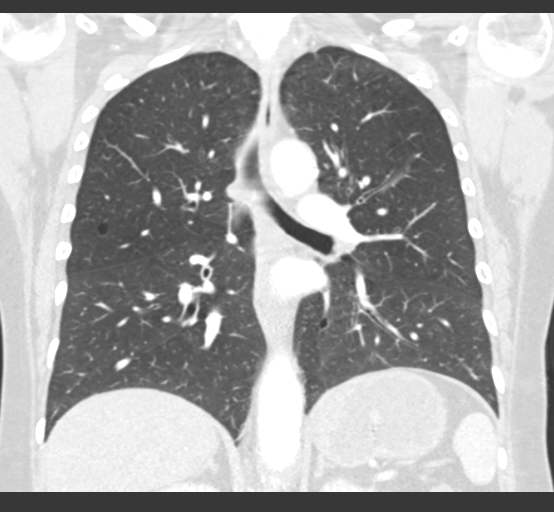

[15 of 36 positions shown; findings below may reference images not displayed]

FINDINGS: Cardiovascular: Scattered atherosclerotic calcifications aorta,
coronary arteries, and proximal great vessels. Ascending thoracic
aorta 3.5 cm diameter. Vascular structures grossly patent on
nondedicated exam. No pericardial effusion.

Mediastinum/Nodes: No thoracic adenopathy. Mediastinum unremarkable.
Esophagus and visualized base of cervical region normal appearance.

Lungs/Pleura: Minimal dependent atelectasis in the lower lobes. 4 mm
partially solid nodule RIGHT lower lobe sagittal image 37. 5 mm
nodule lingula image 67. Additional nonspecific non solid opacity
versus focus of scarring in the perihilar LEFT upper lobe image 51,
11 mm diameter. Remaining lungs clear. No acute infiltrate, pleural
effusion or pneumothorax.

Upper Abdomen: Normal appearance dependent atelectasis.

Musculoskeletal: No acute osseous findings.
IMPRESSION: Aortic atherosclerosis and coronary arterial calcification.

4 mm RIGHT lower lobe and 5 mm lingular nodules as above.

Additional 11 mm focus of questionable nodularity versus scarring in
LEFT perihilar region.

Non-contrast chest CT at 3-6 months is recommended. If nodules
persist, subsequent management will be based upon the most
suspicious nodule(s). This recommendation follows the consensus
statement: Guidelines for Management of Incidental Pulmonary Nodules
Detected on CT Images:From the [HOSPITAL] 5715; published
online before print (10.1148/radiol.6516121275).

## 2017-08-31 IMAGING — CR DG CHEST 1V PORT
1 series · 1 of 1 positions shown · non-contrast
Comparison: 02/21/2016 .

CLINICAL DATA: CABG.

EXAM:
PORTABLE CHEST 1 VIEW

[AP]
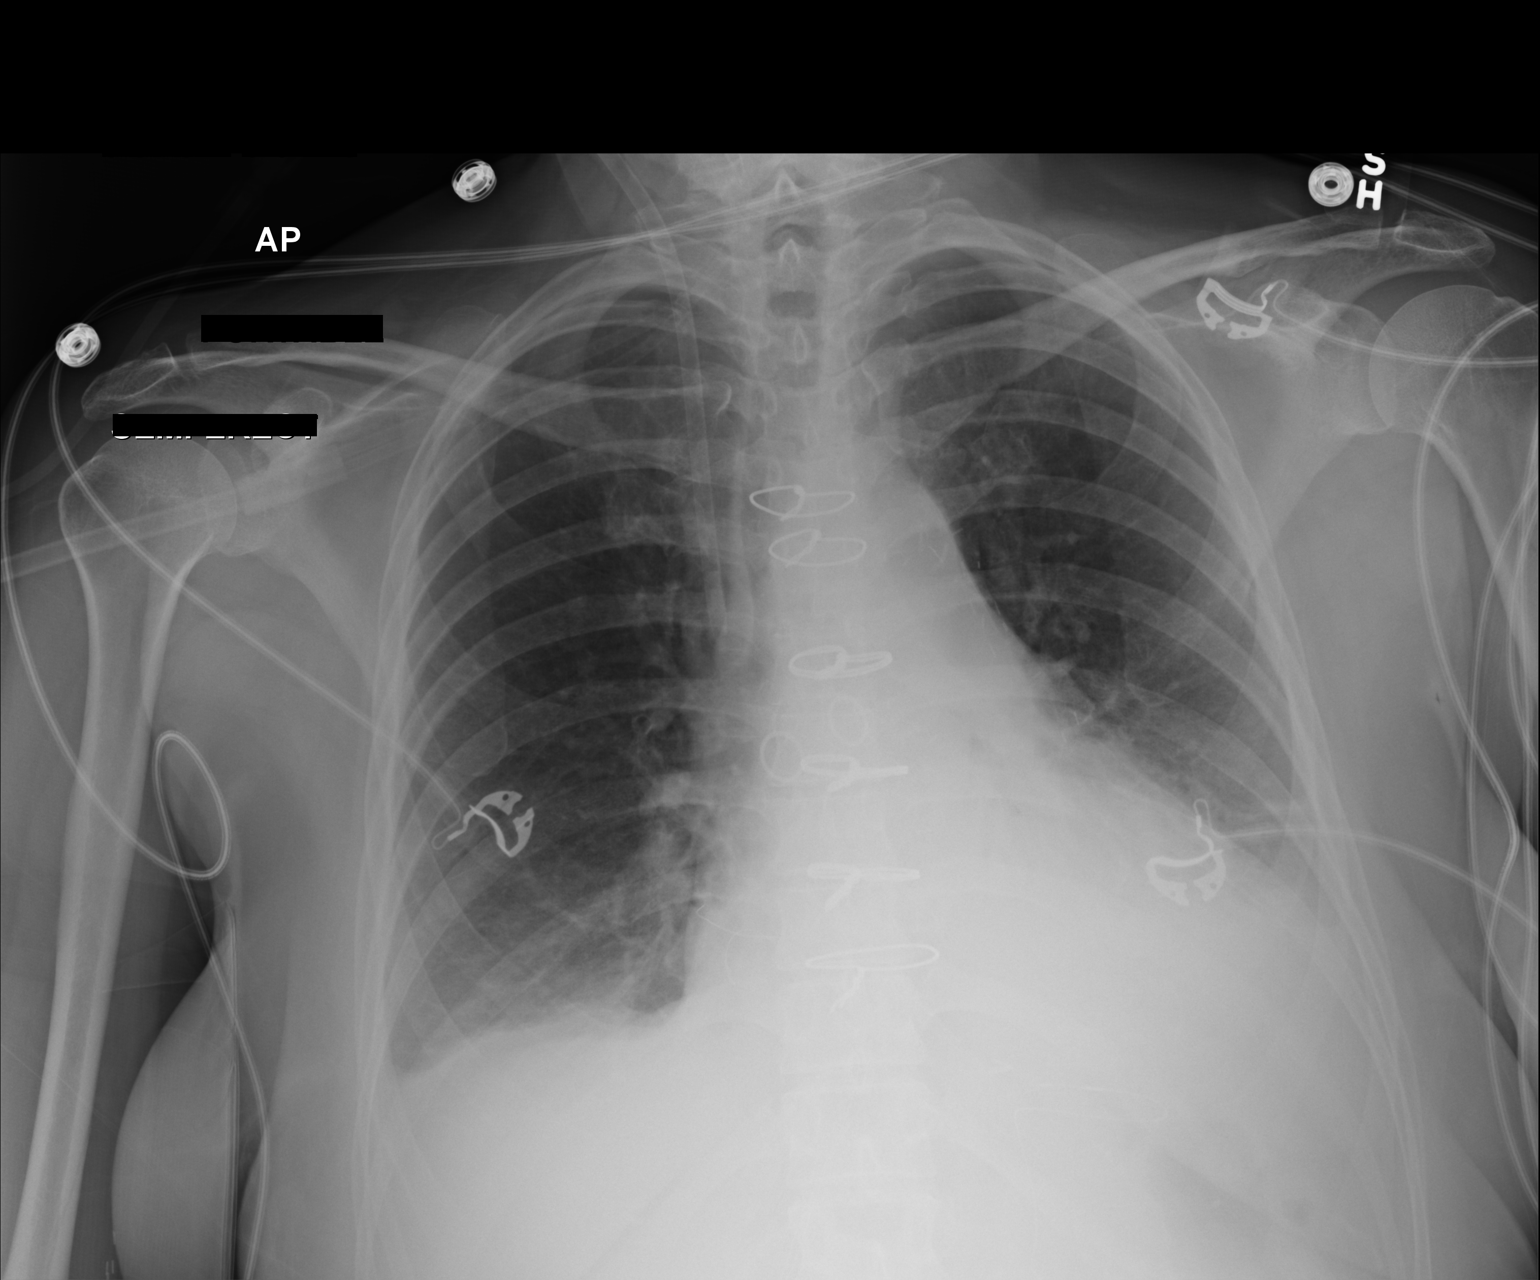

[1 of 1 positions shown; findings below may reference images not displayed]

FINDINGS: Interim removal Swan-Ganz catheter, mediastinal drainage catheter,
left chest tube. Right IJ sheath in stable position. Prior CABG.
Stable cardiomegaly. Low lung volumes with bibasilar atelectasis
and/or infiltrates again noted. Small bilateral pleural effusions
noted. No pneumothorax .
IMPRESSION: 1. Interim removal of Swan-Ganz catheter, mediastinal drainage
catheter, left chest tube . Right IJ sheath in stable position.

2.  Prior CABG.  Mild cardiomegaly.  No pulmonary venous congestion.

3. Bibasilar atelectasis and/or infiltrates again noted. Small
bilateral pleural effusions noted on today's exam.

## 2017-09-01 ENCOUNTER — Other Ambulatory Visit: Payer: Self-pay

## 2017-09-01 DIAGNOSIS — I6529 Occlusion and stenosis of unspecified carotid artery: Secondary | ICD-10-CM

## 2017-09-01 DIAGNOSIS — I739 Peripheral vascular disease, unspecified: Secondary | ICD-10-CM

## 2017-09-01 IMAGING — DX DG CHEST 2V
2 series · 2 of 2 positions shown · non-contrast
Comparison: Radiograph February 22, 2016.

CLINICAL DATA: Cough.

EXAM:
CHEST  2 VIEW

[chest pa]
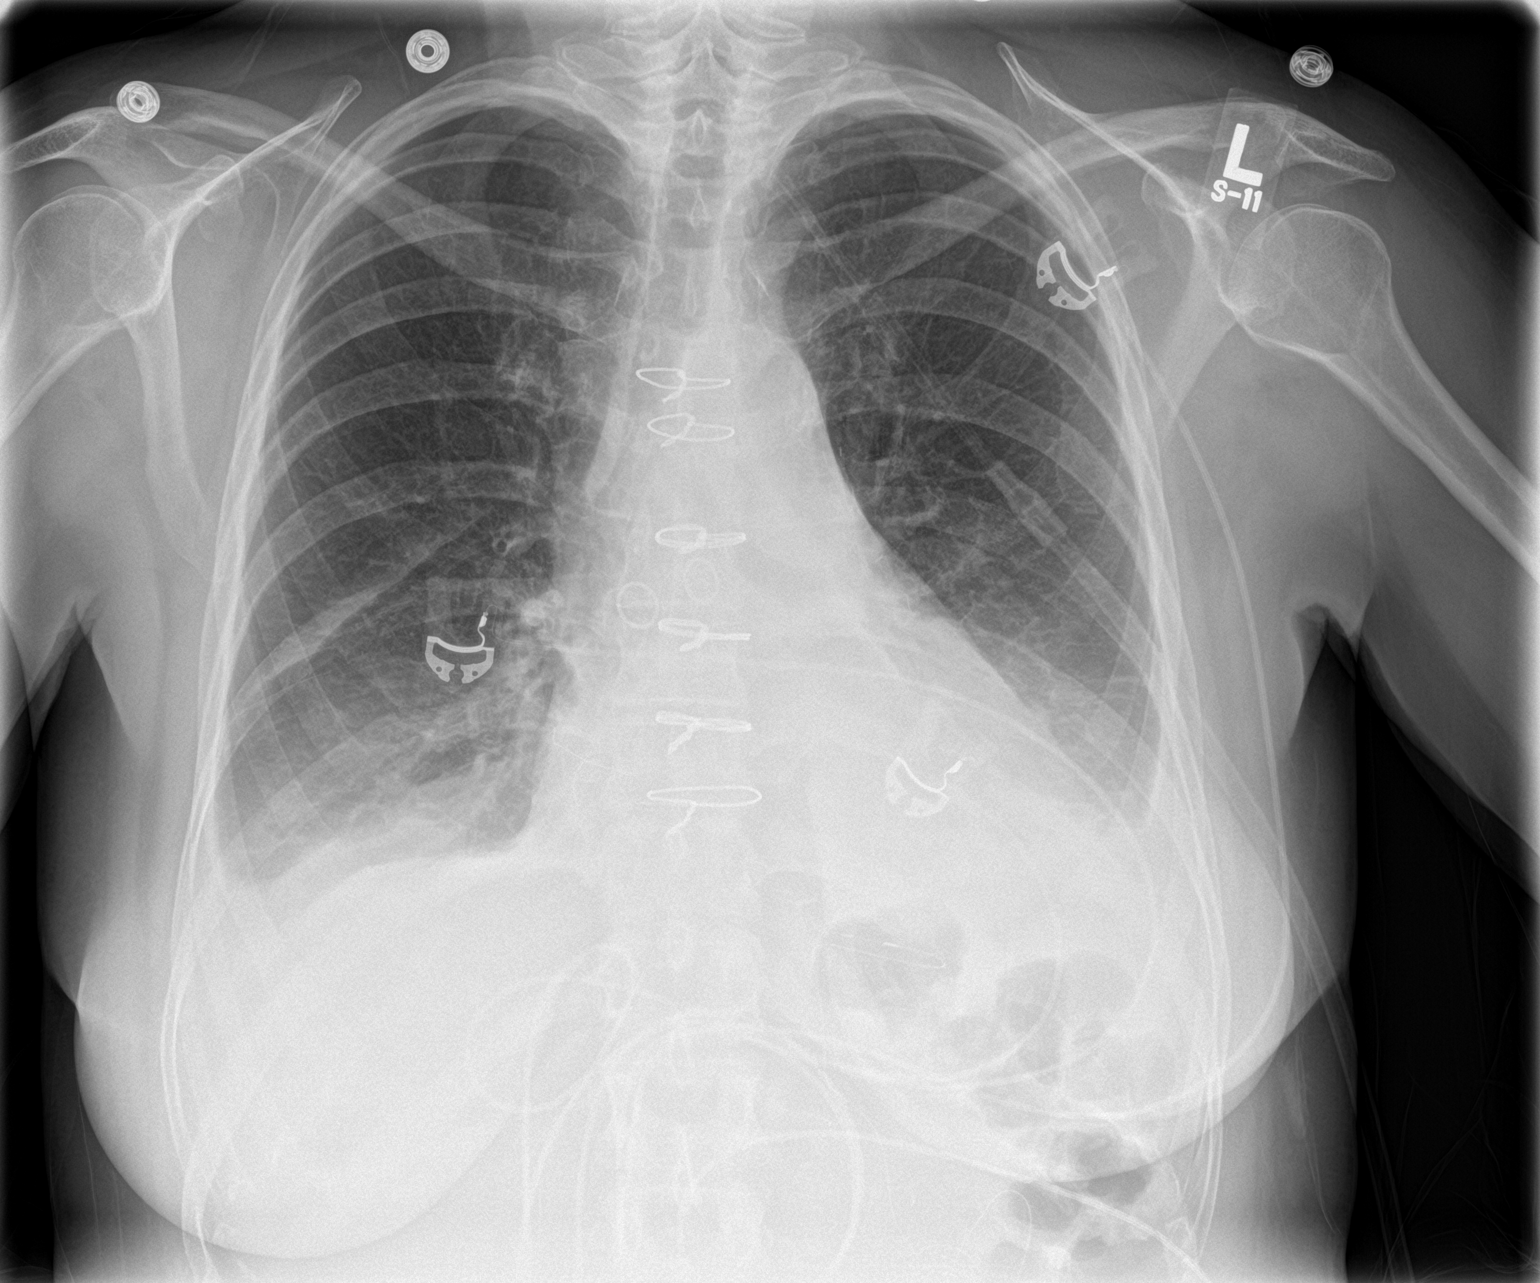

[chest lat]
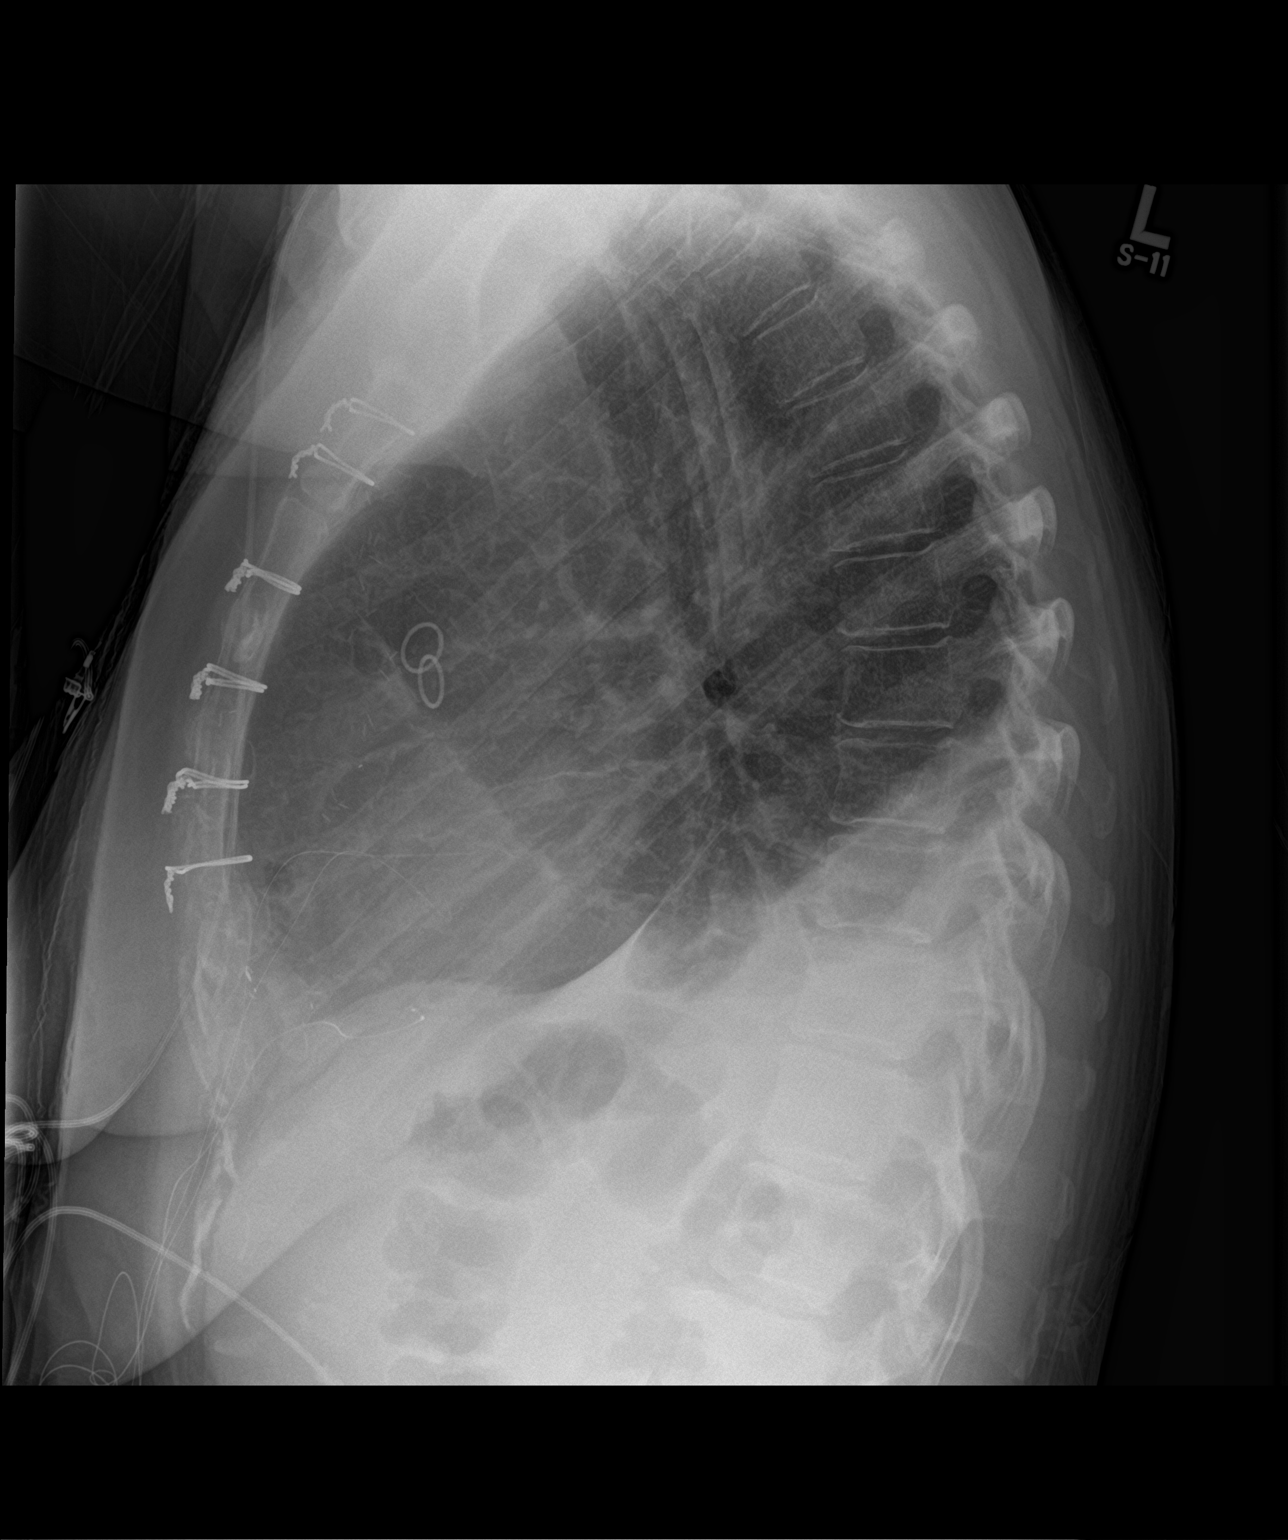

[2 of 2 positions shown; findings below may reference images not displayed]

FINDINGS: Stable cardiomediastinal silhouette. Status post coronary bypass
graft. Stable bibasilar edema or atelectasis is noted with
associated pleural effusions. No pneumothorax is noted.
Atherosclerosis of thoracic aorta is noted.
IMPRESSION: Stable bibasilar subsegmental atelectasis or edema with associated
pleural effusions. Aortic atherosclerosis.

## 2017-11-11 ENCOUNTER — Other Ambulatory Visit: Payer: Self-pay | Admitting: *Deleted

## 2017-11-11 MED ORDER — METOPROLOL TARTRATE 25 MG PO TABS: 12.5000 mg | ORAL_TABLET | Freq: Two times a day (BID) | ORAL | 1 refills | Status: DC

## 2017-11-17 ENCOUNTER — Other Ambulatory Visit: Payer: Self-pay

## 2017-11-17 MED ORDER — CLOPIDOGREL BISULFATE 75 MG PO TABS: 75.0000 mg | ORAL_TABLET | Freq: Every day | ORAL | 1 refills | Status: DC

## 2017-12-24 NOTE — Progress Notes (Signed)
Cardiology Office Note  Date: 12/25/2017   ID: Kelsey Hansen, DOB 01/17/1966, MRN [id number]  PCP: Patient, No Pcp Per  Primary Cardiologist: Nona Dell, MD   Chief Complaint  Patient presents with  . Coronary Artery Disease     History of Present Illness: Kelsey Hansen is a 52 y.o. female last seen in January.  She is here for a routine visit.  She tells me that she has not had any progressive angina symptoms or used nitroglycerin.  Reports chronic NYHA class II dyspnea.  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  We did talk about a walking regimen.  Lipid panel from January is outlined below, she was started on Zetia at that time with known statin intolerance.  She states that she ran out of this medication and did not get it refilled.  Does not recall any intolerances.  Otherwise her cardiac regimen includes aspirin, Plavix, and Lopressor.  We are refilling her nitroglycerin since her current bottle is old.  She continues to smoke cigarettes, we have addressed smoking cessation several times.  She has had difficulty quitting.  Past Medical History:  Diagnosis Date  . Anxiety   . Brachial artery occlusion [facility])    Postcatheterization complication, required surgical repair  . Coronary atherosclerosis of native coronary artery    Multivessel  . Hyperlipidemia   . NSTEMI (non-ST elevated myocardial infarction) (HCC) 11/07   PTCA of distal circumflex 2007, residual 50% LAD and 80% PDA  . Peripheral arterial disease (HCC)    Right common and external iliac stent, right to left fem-fem bypass    Past Surgical History:  Procedure Laterality Date  . CARDIAC CATHETERIZATION  02/15/2016  . CARDIAC CATHETERIZATION N/A 02/15/2016   Procedure: Left Heart Cath and Coronary Angiography;  Surgeon: Lyn Records, MD;  Location[facility] INVASIVE CV LAB;  Service: Cardiovascular;  Laterality: N/A;  . CORONARY ARTERY BYPASS GRAFT  N/A 02/20/2016   Procedure: CORONARY ARTERY BYPASS GRAFTING (CABG)x three,  using left internal mammary artery and right greater saphenous leg vein using endoscope.;  Surgeon: Kerin Perna, MD;  Location[facility] OR;  Service: Open Heart Surgery;  Laterality: N/A;  . FEMORAL BYPASS  7/08   Right to left; followed by right external iliac artery stent placed 12/08  . PR VEIN BYPASS GRAFT,AORTO-FEM-POP    . SP [place] TRANSCATHETER OCCLUSION     Occlusion off her fem-fem bypass with claudication in both legs  . TEE WITHOUT CARDIOVERSION N/A 02/20/2016   Procedure: TRANSESOPHAGEAL ECHOCARDIOGRAM (TEE);  Surgeon: Kerin Perna, MD;  Location: [facility] OR;  Service: Open Heart Surgery;  Laterality: N/A;    Current Outpatient Medications  Medication Sig Dispense Refill  . acetaminophen (TYLENOL) 500 MG tablet Take 500 mg by mouth every 6 (six) hours as needed.    Marland Kitchen aspirin EC 81 MG tablet Take 81 mg by mouth daily.     . clopidogrel (PLAVIX) 75 MG tablet Take 1 tablet (75 mg total) by mouth daily. 90 tablet 1  . metoprolol tartrate (LOPRESSOR) 25 MG tablet Take 0.5 tablets (12.5 mg total) by mouth 2 (two) times daily. 90 tablet 1  . nitroGLYCERIN (NITROSTAT) 0.4 MG SL tablet Place 0.4 mg under the tongue every 5 (five) minutes as needed for chest pain.     No current facility-administered medications for this visit.    Allergies:  Aleve [naproxen]; Crestor [rosuvastatin]; Lipitor [atorvastatin]; Pravachol [pravastatin]; Zocor [  simvastatin]; Hydrocodone-acetaminophen; and Oxycodone-acetaminophen   Social History: The patient  reports that she has been smoking cigarettes.  She started smoking about 42 years ago. She has a 7.50 pack-year smoking history. She has never used smokeless tobacco. She reports that she does not drink alcohol or use drugs.   ROS:  Please see the history of present illness. Otherwise, complete review of systems is positive for none.  All other systems are reviewed and negative.   Physical  Exam: VS:  BP (!) 130/58   Pulse 60   Ht 5\' 2"  (1.575 m)   Wt 160 lb (72.6 kg)   SpO2 96%   BMI 29.26 kg/m , BMI Body mass index is 29.26 kg/m.  Wt Readings from Last 3 Encounters:  12/25/17 160 lb (72.6 kg)  08/06/17 161 lb (73 kg)  07/13/17 159 lb (72.1 kg)    General: No distress. HEENT: Conjunctiva and lids normal, oropharynx clear. Neck: Supple, no elevated JVP or carotid bruits, no thyromegaly. Lungs: No wheezing, nonlabored breathing at rest. Cardiac: Regular rate and rhythm, no S3 or significant systolic murmur, no pericardial rub. Abdomen: Soft, nontender, bowel sounds present. Extremities: No pitting edema, distal pulses 1-2+. Skin: Warm and dry. Musculoskeletal: No kyphosis. Neuropsychiatric: Alert and oriented x3, affect grossly appropriate.  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  Recent Labwork:  January 2019: Cholesterol 200, triglycerides 92, HDL 40, LDL 141  Other Studies Reviewed Today:  Cardiac catheterization 02/15/2016:  Prox RCA-2 lesion, 75 %stenosed.  Prox RCA-1 lesion, 90 %stenosed.  Mid RCA-2 lesion, 85 %stenosed.  Mid RCA-1 lesion, 80 %stenosed.  Ost Ramus to Ramus lesion, 65 %stenosed.  Prox Cx to Mid Cx lesion, 75 %stenosed.  1st Diag lesion, 50 %stenosed.  Ost LAD lesion, 90 %stenosed.  The left ventricular ejection fraction is 55-65% by visual estimate.  The left ventricular systolic function is normal.  LV end diastolic pressure is normal.   Severe 2 vessel coronary disease involving the right coronary diffusely (relatively small vessel), and significant proximal LAD involvement. The circumflex contains moderate coronary disease including the site of previous angioplasty in the mid vessel at the time of infarction in 2007. Ramus has intermediate stenosis.  Normal left ventricular systolic function. LVEF at least 60% with normal  EDP.  Severe peripheral vascular disease with right iliac stents and left femoral-femoral bypass. Prior right brachial occlusion at the time of catheterization 2007 requiring thrombectomy by Dr. Bud FaceGregory Hayes.  Echocardiogram 02/16/2016: Study Conclusions  - Left ventricle: The cavity size was normal. Wall thickness was increased in a pattern of mild LVH. Systolic function was normal. The estimated ejection fraction was in the range of 60% to 65%. Wall motion was normal; there were no regional wall motion abnormalities. Left ventricular diastolic function parameters were normal. - Aortic valve: Trileaflet. Sclerosis without stenosis. There was mild regurgitation. - Left atrium: The atrium was normal in size. - Inferior vena cava: The vessel was normal in size. The respirophasic diameter changes were in the normal range (>= 50%), consistent with normal central venous pressure.  Impressions:  - LVEF 60-65%, mild LVH, normal wall motion, normal diastolic function, aortic valve sclerosis with mild AI, normal LA size, normal IVC.  Assessment and Plan:  1.  Multivessel CAD status post CABG in September 2017.  We have continued dual antiplatelet therapy and she reports no active angina or nitroglycerin use at this time.  Refill provided for fresh bottle  of nitroglycerin.  Continue beta-blocker.  2.  Mixed hyperlipidemia with statin intolerance.  Lipid panel from January is outlined above.  I have recommended that she continue on Zetia and we will recheck a lipid panel when she has been more consistent.  3.  Ongoing tobacco abuse.  We continue to discuss smoking cessation.  4.  PAD with stable claudication symptoms.  She continues to follow with Dr. Darrick Penna.  Current medicines were reviewed with the patient today.  Disposition: Follow-up in 6 months.  Signed, Jonelle Sidle, MD, [facility] 12/25/2017 11:24 AM    [facility] [address] [place], [place], [place] [address] Phone: [phone]; Fax: [phone]

## 2017-12-25 ENCOUNTER — Ambulatory Visit (INDEPENDENT_AMBULATORY_CARE_PROVIDER_SITE_OTHER): Payer: Medicare Other | Admitting: Cardiology

## 2017-12-25 ENCOUNTER — Encounter: Payer: Self-pay | Admitting: Cardiology

## 2017-12-25 VITALS — BP 130/58 | HR 60 | Ht 62.0 in | Wt 160.0 lb

## 2017-12-25 DIAGNOSIS — E782 Mixed hyperlipidemia: Secondary | ICD-10-CM | POA: Diagnosis not present

## 2017-12-25 DIAGNOSIS — Z720 Tobacco use: Secondary | ICD-10-CM | POA: Diagnosis not present

## 2017-12-25 DIAGNOSIS — I779 Disorder of arteries and arterioles, unspecified: Secondary | ICD-10-CM | POA: Diagnosis not present

## 2017-12-25 DIAGNOSIS — Z789 Other specified health status: Secondary | ICD-10-CM | POA: Diagnosis not present

## 2017-12-25 DIAGNOSIS — I25119 Atherosclerotic heart disease of native coronary artery with unspecified angina pectoris: Secondary | ICD-10-CM | POA: Diagnosis not present

## 2017-12-25 DIAGNOSIS — I739 Peripheral vascular disease, unspecified: Secondary | ICD-10-CM | POA: Diagnosis not present

## 2017-12-25 MED ORDER — EZETIMIBE 10 MG PO TABS: 10.0000 mg | ORAL_TABLET | Freq: Every day | ORAL | 3 refills | Status: DC

## 2017-12-25 MED ORDER — NITROGLYCERIN 0.4 MG SL SUBL: 0.4000 mg | SUBLINGUAL_TABLET | SUBLINGUAL | 3 refills | Status: DC | PRN

## 2017-12-25 NOTE — Patient Instructions (Signed)
Medication Instructions:  Your physician has recommended you make the following change in your medication:   START Zetia 10 mg daily  Please continue all other medications as prescribed  Labwork: NONE  Testing/Procedures: NONE  Follow-Up: Your physician wants you to follow-up in: 6 MONTHS WITH DR. Diona BrownerMCDOWELL You will receive a reminder letter in the mail two months in advance. If you don't receive a letter, please call our office to schedule the follow-up appointment.  Any Other Special Instructions Will Be Listed Below (If Applicable).  If you need a refill on your cardiac medications before your next appointment, please call your pharmacy.

## 2018-02-04 ENCOUNTER — Encounter (HOSPITAL_COMMUNITY): Payer: Medicare Other

## 2018-02-04 ENCOUNTER — Ambulatory Visit: Payer: Medicare Other | Admitting: Family

## 2018-03-05 ENCOUNTER — Ambulatory Visit (INDEPENDENT_AMBULATORY_CARE_PROVIDER_SITE_OTHER)
Admission: RE | Admit: 2018-03-05 | Discharge: 2018-03-05 | Disposition: A | Discharge status: Home / Self Care | Payer: Medicare Other | Source: Ambulatory Visit | Attending: Vascular Surgery | Admitting: Vascular Surgery

## 2018-03-05 ENCOUNTER — Ambulatory Visit (HOSPITAL_COMMUNITY)
Admission: RE | Admit: 2018-03-05 | Discharge: 2018-03-05 | Disposition: A | Discharge status: Home / Self Care | Payer: Medicare Other | Source: Ambulatory Visit | Attending: Vascular Surgery | Admitting: Vascular Surgery

## 2018-03-05 ENCOUNTER — Encounter: Payer: Self-pay | Admitting: Family

## 2018-03-05 ENCOUNTER — Other Ambulatory Visit: Payer: Self-pay

## 2018-03-05 ENCOUNTER — Ambulatory Visit (INDEPENDENT_AMBULATORY_CARE_PROVIDER_SITE_OTHER): Payer: Medicare Other | Admitting: Family

## 2018-03-05 VITALS — BP 160/70 | HR 59 | Temp 99.0°F | Resp 14 | Ht 62.0 in | Wt 160.0 lb

## 2018-03-05 DIAGNOSIS — I6523 Occlusion and stenosis of bilateral carotid arteries: Secondary | ICD-10-CM | POA: Diagnosis not present

## 2018-03-05 DIAGNOSIS — F17200 Nicotine dependence, unspecified, uncomplicated: Secondary | ICD-10-CM | POA: Diagnosis not present

## 2018-03-05 DIAGNOSIS — I779 Disorder of arteries and arterioles, unspecified: Secondary | ICD-10-CM

## 2018-03-05 DIAGNOSIS — I739 Peripheral vascular disease, unspecified: Secondary | ICD-10-CM | POA: Insufficient documentation

## 2018-03-05 DIAGNOSIS — I6529 Occlusion and stenosis of unspecified carotid artery: Secondary | ICD-10-CM

## 2018-03-05 NOTE — Progress Notes (Signed)
[facility] [place]   CC: Follow up peripheral artery occlusive disease and extracranial carotid artery stenosis   History of Present Illness Kelsey Hansen is a 52 y.o. female who is s/p right common iliac artery and Right external iliac artery percutaneous transluminal angioplasty with stent placement December 2008 by  Dr. Madilyn Fireman and Dr. 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  She was lost to follow up from 08-19-13 to 07-13-17. She was seeking relief from the pain in her legs which is worse at night and keeps her awake, and RLS at night.   She does not have claudication sx's in her legs unless she walks a couple of miles, but climbing stairs the posterior aspect of both her legs hurt and feet tingle.   She states she still is taking 3 tabs of acetaminophen, 500-650 mg each tab, every 3-4 hours. I advised pt to stop taking acetaminophen and talk with her PCP re this; she states she did speak with him. Pt states he PCP has left the area, and she needs another PCP.  I again offered to refer her to pain management, she declined.  She has pain on soles of feet, chronic numbness on medial aspects both thighs since the 2008 fem-fem bypass, denies non healing ulcers on legs. She complains of pain at the distal tip of her right scapula since cardiac cath was done in September 2017, access via the right radial artery.    She had a large MI in 2007, denies any known hx of strokes or TIA's.  Dr. Darrick Penna last evaluated pt on 08-06-17. At that time she had no real significant improvement on low-dose Neurontin for her neuropathic symptoms.  She had adequate perfusion to both lower extremities at that time and Dr. Darrick Penna indicated that he would not consider a redo operation in her unless she was at risk of limb loss.  Especially in light of the fact that she continues to smoke.  She had no symptoms from her moderate carotid stenosis bilaterally. Dr.  Darrick Penna advised follow-up with our nurse practitioner in 6 months time with repeat ABIs and carotid duplex scan.   He increased her Neurontin to 300 mg 3 times daily today to see if she gets any symptomatic improvement.  Dr. Darrick Penna discussed with the patient that her long-term chronic pain management would need to be monitored by a primary care physician including her Neurontin.  If she had no significant benefit from this dosing then Dr. Darrick Penna advised that the Neurontin be discontinued. Although our nurse practitioner entertained whether or not the right shoulder pain may be secondary to thoracic outlet syndrome the patient exhibited no symptoms consistent with this in history or physical exam.  Chronic right shoulder pain of unknown etiology.   Diabetic: No Tobacco use: smoker (1/2 ppd x 43 yrs)  Pt meds include: Statin :No, was taking red yeast rice; states she had arthralgias with Lipitor and Crestor Betablocker: Yes ASA: Yes Other anticoagulants/antiplatelets: Plavix    Past Medical History:  Diagnosis Date  . Anxiety   . 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 Coronary atherosclerosis of native coronary artery    Multivessel  . Hyperlipidemia   . NSTEMI (non-ST elevated myocardial infarction) (HCC) 11/07   PTCA of distal circumflex 2007, residual 50% LAD and 80% PDA  . Peripheral arterial disease (HCC)    Right common and external iliac stent, right to  left fem-fem bypass    Social History Social History   Tobacco Use  . Smoking status: Current Every Day Smoker    Packs/day: 0.25    Years: 30.00    Pack years: 7.50    Types: Cigarettes    Start date: 01/27/1975  . Smokeless tobacco: Never Used  . Tobacco comment: 10 cigarettes per day  Substance Use Topics  . Alcohol use: No  . Drug use: No    Family History Family History  Problem Relation Age of Onset  . Cancer Mother        Lung  . Heart disease  Mother        Heart Disease before age 47  . Heart attack Mother 14  . Deep vein thrombosis Father   . Arthritis Father        Back  . Varicose Veins Father   . Cancer Sister        Breast  . Diabetes Neg Hx   . Hypertension Neg Hx   . Coronary artery disease Neg Hx     Past Surgical History:  Procedure Laterality Date  . CARDIAC CATHETERIZATION  02/15/2016  . CARDIAC CATHETERIZATION N/A 02/15/2016   Procedure: Left Heart Cath and Coronary Angiography;  Surgeon: Lyn Records, MD;  Location: [facility] INVASIVE CV LAB;  Service: Cardiovascular;  Laterality: N/A;  . CORONARY ARTERY BYPASS GRAFT N/A 02/20/2016   Procedure: CORONARY ARTERY BYPASS GRAFTING (CABG)x three,  using left internal mammary artery and right greater saphenous leg vein using endoscope.;  Surgeon: Kerin Perna, MD;  Location: [facility] OR;  Service: Open Heart Surgery;  Laterality: N/A;  . FEMORAL BYPASS  7/08   Right to left; followed by right external iliac artery stent placed 12/08  . PR VEIN BYPASS GRAFT,AORTO-FEM-POP    . SP [place] TRANSCATHETER OCCLUSION     Occlusion off her fem-fem bypass with claudication in both legs  . TEE WITHOUT CARDIOVERSION N/A 02/20/2016   Procedure: TRANSESOPHAGEAL ECHOCARDIOGRAM (TEE);  Surgeon: Kerin Perna, MD;  Location: [facility] OR;  Service: Open Heart Surgery;  Laterality: N/A;    Allergies  Allergen Reactions  . Aleve [Naproxen] Palpitations  . Crestor [Rosuvastatin] Other (See Comments)    Joint & muscle aches and pain  . Lipitor [Atorvastatin] Other (See Comments)    Joint & muscle aches and pain  . Pravachol [Pravastatin] Other (See Comments)    Joint & muscle aches and pain  . Zocor [Simvastatin] Other (See Comments)    Joint & muscle aches and pain  . Hydrocodone-Acetaminophen Nausea And Vomiting  . Oxycodone-Acetaminophen Nausea And Vomiting    Current Outpatient Medications  Medication Sig Dispense Refill  . acetaminophen (TYLENOL) 500 MG tablet Take 500 mg by mouth every 6  (six) hours as needed.    Marland Kitchen aspirin EC 81 MG tablet Take 81 mg by mouth daily.     . clopidogrel (PLAVIX) 75 MG tablet Take 1 tablet (75 mg total) by mouth daily. 90 tablet 1  . metoprolol tartrate (LOPRESSOR) 25 MG tablet Take 0.5 tablets (12.5 mg total) by mouth 2 (two) times daily. 90 tablet 1  . nitroGLYCERIN (NITROSTAT) 0.4 MG SL tablet Place 1 tablet (0.4 mg total) under the tongue every 5 (five) minutes as needed for chest pain. 25 tablet 3  . ezetimibe (ZETIA) 10 MG tablet Take 1 tablet (10 mg total) by mouth daily. (Patient not taking: Reported on 03/05/2018) 90 tablet 3   No current facility-administered medications for this  visit.     ROS: See HPI for pertinent positives and negatives.   Physical Examination  Vitals:   03/05/18 1410 03/05/18 1412 03/05/18 1413  BP: (!) 142/73 (!) 144/75 (!) 160/70  Pulse: 60 (!) 59 (!) 59  Resp: 14    Temp: 99 F (37.2 C)    TempSrc: Oral    SpO2: 98%    Weight: 160 lb (72.6 kg)    Height: 5\' 2"  (1.575 m)     Body mass index is 29.26 kg/m.  General: A&O x 3, WDWN female. Gait: normal HENT: No gross abnormalities. Edentulous Eyes: PERRLA. Pulmonary: Respirations are non labored, CTAB, fair air movement in all fields Cardiac: regular rhythm, no detected murmur.         Carotid Bruits Right Left   Negative Negative   Radial pulses are 1+ palpable bilaterally   Adominal aortic pulse is not palpable                         VASCULAR EXAM: Extremities without ischemic changes, without Gangrene; without open wounds. Toes of right foot are cooler and more pale than the toes of her left foot.                                                                                                                                                        LE Pulses Right Left       FEMORAL  1+ palpable  1+palpable        POPLITEAL  not palpable   not palpable       POSTERIOR TIBIAL  not palpable   not palpable        DORSALIS PEDIS       ANTERIOR TIBIAL not palpable  not palpable    Abdomen: soft, NT, no palpable masses. Skin: no rashes, no cellulitis, no ulcers noted. Musculoskeletal: no muscle wasting or atrophy.      Neurologic: A&O X 3; appropriate affect, Sensation is normal; MOTOR FUNCTION:  moving all extremities equally, motor strength 5/5 throughout. Speech is fluent/normal. CN 2-12 intact. +SLR bilaterally: elicits pain in groin and hip.  Psychiatric: Thought content is normal, mood appropriate for clinical situation.     ASSESSMENT: Gaston Islamracy L Strandberg is a 52 y.o. 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  Positive straight leg raise test bilaterally: elicits pain in groin and hip. She has no known back problems.     She has no claudication sx's in her legs until walking 2 miles, pain in her legs is worse at night.  She has developed collateral arterial perfusion.    She does not have DM. Her  primary atherosclerotic risk factor is continued smoking x 43 years. She is also statin intolerant.   She states she is taking 3 tabs of acetaminophen, 650 mg each tab, every 3-4 hours to help the pain in her legs which is not due to mild and moderate lack of arterial perfusion.  I advised pt to stop taking acetaminophen and talk with her PCP re this, states she has spoken with her PCP who has left the area, according to pt, and she is looking for another PCP.  I offered again to refer her to pain management, she declined at this time.  Pt states her PCP has left the area, will refer to PCP in [place], [place] where she lives, or [place].  Once she establishes a PCP, will consider referral to spine specialist to evaluate her thoracic back pain and sciatic type pain, if she has not already had a referral from a new PCP.     DATA  Carotid Duplex  (03-05-18): Bilateral ICA with 40-59% stenosis. Bilateral vertebral artery flow is antegrade.  Bilateral subclavian artery waveforms are normal.  No change compared to the exam on 07-13-17.     ABI (Date: 03/05/2018):  R:   ABI: 0.85 (was 0.88 on 07-13-17),   PT: mono  DP: dampened mono  TBI:  0.50, toe pressure 62 (was 0.48)  L:   ABI: 0.61 (was 0.70),   PT: mono  DP: mono  TBI: 0.37, toe pressure 46 (was 0.29) ABI on the right seems stable with mild disease; however, ABI of 0.85 does not correlate with dampened monophasic and monophasic waveforms, suggesting medial calcification.  Left ABI declined to 0.61 from 0.70, moderate disease with monophasic waveforms. Stable right TBI, improved left TBI.     PLAN:  Based on the patient's vascular studies and examination, pt will return to clinic in 1 year with carotid duplex and ABI's.    I advised pt to stop the Tylenol and see her PCP ASAP to check her liver function.   The patient was again counseled re smoking cessation and given several free resources re smoking cessation.    I discussed in depth with the patient the nature of atherosclerosis, and emphasized the importance of maximal medical management including strict control of blood pressure, blood glucose, and lipid levels, obtaining regular exercise, and cessation of smoking.  The patient is aware that without maximal medical management the underlying atherosclerotic disease process will progress, limiting the benefit of any interventions.  The patient was given information about PAD including signs, symptoms, treatment, what symptoms should prompt the patient to seek immediate medical care, and risk reduction measures to take.  Charisse March, RN, MSN, FNP-C [facility] Phone: [phone]  Clinic MD: Randie Heinz  03/05/18 2:15 PM

## 2018-03-05 NOTE — Patient Instructions (Addendum)
Steps to Quit Smoking Smoking tobacco can be bad for your health. It can also affect almost every organ in your body. Smoking puts you and people around you at risk for many serious long-lasting (chronic) diseases. Quitting smoking is hard, but it is one of the best things that you can do for your health. It is never too late to quit. What are the benefits of quitting smoking? When you quit smoking, you lower your risk for getting serious diseases and conditions. They can include:  Lung cancer or lung disease.  Heart disease.  Stroke.  Heart attack.  Not being able to have children (infertility).  Weak bones (osteoporosis) and broken bones (fractures).  If you have coughing, wheezing, and shortness of breath, those symptoms may get better when you quit. You may also get sick less often. If you are pregnant, quitting smoking can help to lower your chances of having a baby of low birth weight. What can I do to help me quit smoking? Talk with your doctor about what can help you quit smoking. Some things you can do (strategies) include:  Quitting smoking totally, instead of slowly cutting back how much you smoke over a period of time.  Going to in-person counseling. You are more likely to quit if you go to many counseling sessions.  Using resources and support systems, such as: ? Online chats with a counselor. ? Phone quitlines. ? Printed self-help materials. ? Support groups or group counseling. ? Text messaging programs. ? Mobile phone apps or applications.  Taking medicines. Some of these medicines may have nicotine in them. If you are pregnant or breastfeeding, do not take any medicines to quit smoking unless your doctor says it is okay. Talk with your doctor about counseling or other things that can help you.  Talk with your doctor about using more than one strategy at the same time, such as taking medicines while you are also going to in-person counseling. This can help make  quitting easier. What things can I do to make it easier to quit? Quitting smoking might feel very hard at first, but there is a lot that you can do to make it easier. Take these steps:  Talk to your family and friends. Ask them to support and encourage you.  Call phone quitlines, reach out to support groups, or work with a counselor.  Ask people who smoke to not smoke around you.  Avoid places that make you want (trigger) to smoke, such as: ? Bars. ? Parties. ? Smoke-break areas at work.  Spend time with people who do not smoke.  Lower the stress in your life. Stress can make you want to smoke. Try these things to help your stress: ? Getting regular exercise. ? Deep-breathing exercises. ? Yoga. ? Meditating. ? Doing a body scan. To do this, close your eyes, focus on one area of your body at a time from head to toe, and notice which parts of your body are tense. Try to relax the muscles in those areas.  Download or buy apps on your mobile phone or tablet that can help you stick to your quit plan. There are many free apps, such as QuitGuide from the CDC (Centers for Disease Control and Prevention). You can find more support from smokefree.gov and other websites.  This information is not intended to replace advice given to you by your health care provider. Make sure you discuss any questions you have with your health care provider. Document Released: 03/29/2009 Document   Revised: 01/29/2016 Document Reviewed: 10/17/2014 Elsevier Interactive Patient Education  2018 Elsevier Inc.     Stroke Prevention Some health problems and behaviors may make it more likely for you to have a stroke. Below are ways to lessen your risk of having a stroke.  Be active for at least 30 minutes on most or all days.  Do not smoke. Try not to be around others who smoke.  Do not drink too much alcohol. ? Do not have more than 2 drinks a day if you are a man. ? Do not have more than 1 drink a day if you  are a woman and are not pregnant.  Eat healthy foods, such as fruits and vegetables. If you were put on a specific diet, follow the diet as told.  Keep your cholesterol levels under control through diet and medicines. Look for foods that are low in saturated fat, trans fat, cholesterol, and are high in fiber.  If you have diabetes, follow all diet plans and take your medicine as told.  Ask your doctor if you need treatment to lower your blood pressure. If you have high blood pressure (hypertension), follow all diet plans and take your medicine as told by your doctor.  If you are 18-39 years old, have your blood pressure checked every 3-5 years. If you are age 40 or older, have your blood pressure checked every year.  Keep a healthy weight. Eat foods that are low in calories, salt, saturated fat, trans fat, and cholesterol.  Do not take drugs.  Avoid birth control pills, if this applies. Talk to your doctor about the risks of taking birth control pills.  Talk to your doctor if you have sleep problems (sleep apnea).  Take all medicine as told by your doctor. ? You may be told to take aspirin or blood thinner medicine. Take this medicine as told by your doctor. ? Understand your medicine instructions.  Make sure any other conditions you have are being taken care of.  Get help right away if:  You suddenly lose feeling (you feel numb) or have weakness in your face, arm, or leg.  Your face or eyelid hangs down to one side.  You suddenly feel confused.  You have trouble talking (aphasia) or understanding what people are saying.  You suddenly have trouble seeing in one or both eyes.  You suddenly have trouble walking.  You are dizzy.  You lose your balance or your movements are clumsy (uncoordinated).  You suddenly have a very bad headache and you do not know the cause.  You have new chest pain.  Your heart feels like it is fluttering or skipping a beat (irregular  heartbeat). Do not wait to see if the symptoms above go away. Get help right away. Call your local emergency services (911 in [place] Do not drive yourself to the hospital. This information is not intended to replace advice given to you by your health care provider. Make sure you discuss any questions you have with your health care provider. Document Released: 12/02/2011 Document Revised: 11/08/2015 Document Reviewed: 12/03/2012 Elsevier Interactive Patient Education  2018 Elsevier Inc.  

## 2018-03-05 NOTE — Progress Notes (Signed)
Vitals:   03/05/18 1410 03/05/18 1412  BP: (!) 142/73 (!) 144/75  Pulse: 60 (!) 59  Resp: 14   Temp: 99 F (37.2 C)   TempSrc: Oral   SpO2: 98%   Weight: 160 lb (72.6 kg)   Height: 5\' 2"  (1.575 m)

## 2018-06-23 NOTE — Progress Notes (Deleted)
Cardiology Office Note  Date: 06/23/2018   ID: Kelsey Islamracy L Mayorga, DOB 1965-12-15, MRN [id number]  PCP: Patient, No Pcp Per  Primary Cardiologist: Nona DellSamuel , MD   No chief complaint on file.   History of Present Illness: Kelsey Hansen is a 53 y.o. female last seen in July 2019.  Continues to follow with [facility] for management of PAD.  She has a history of right common iliac artery andright external iliac artery percutaneous transluminal angioplasty with stent placement December 2008, and knownocclusion ofright to left femoral bypass graft.  Past Medical History:  Diagnosis Date  . Anxiety   . 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 Coronary atherosclerosis of native coronary artery    Multivessel  . Hyperlipidemia   . NSTEMI (non-ST elevated myocardial infarction) (HCC) 11/07   PTCA of distal circumflex 2007, residual 50% LAD and 80% PDA  . Peripheral arterial disease (HCC)    Right common and external iliac stent, right to left fem-fem bypass    Past Surgical History:  Procedure Laterality Date  . CARDIAC CATHETERIZATION  02/15/2016  . CARDIAC CATHETERIZATION N/A 02/15/2016   Procedure: Left Heart Cath and Coronary Angiography;  Surgeon: Lyn RecordsHenry W Smith, MD;  [facility];  Service: Cardiovascular;  Laterality: N/A;  . CORONARY ARTERY BYPASS GRAFT N/A 02/20/2016   Procedure: CORONARY ARTERY BYPASS GRAFTING (CABG)x three,  using left internal mammary artery and right greater saphenous leg vein using endoscope.;  Surgeon: Kerin PernaPeter Van Trigt, MD;  [facility];  Service: Open Heart Surgery;  Laterality: N/A;  . FEMORAL BYPASS  7/08   Right to left; followed by right external iliac artery stent placed 12/08  . PR VEIN BYPASS GRAFT,AORTO-FEM-POP    . SP US TRANSCATHETER OCCLUSION     Occlusion off her fem-fem bypass with claudication in both legs  . TEE WITHOUT CARDIOVERSION N/A 02/20/2016   Procedure:  TRANSESOPHAGEAL ECHOCARDIOGRAM (TEE);  Surgeon: Kerin PernaPeter Van Trigt, MD;  [facility];  Service: Open Heart Surgery;  Laterality: N/A;    Current Outpatient Medications  Medication Sig Dispense Refill  . acetaminophen (TYLENOL) 500 MG tablet Take 500 mg by mouth every 6 (six) hours as needed.    Marland Kitchen. aspirin EC 81 MG tablet Take 81 mg by mouth daily.     . clopidogrel (PLAVIX) 75 MG tablet Take 1 tablet (75 mg total) by mouth daily. 90 tablet 1  . ezetimibe (ZETIA) 10 MG tablet Take 1 tablet (10 mg total) by mouth daily. (Patient not taking: Reported on 03/05/2018) 90 tablet 3  . metoprolol tartrate (LOPRESSOR) 25 MG tablet Take 0.5 tablets (12.5 mg total) by mouth 2 (two) times daily. 90 tablet 1  . nitroGLYCERIN (NITROSTAT) 0.4 MG SL tablet Place 1 tablet (0.4 mg total) under the tongue every 5 (five) minutes as needed for chest pain. 25 tablet 3   No current facility-administered medications for this visit.    Allergies:  Aleve [naproxen]; Crestor [rosuvastatin]; Lipitor [atorvastatin]; Pravachol [pravastatin]; Zocor [simvastatin]; Hydrocodone-acetaminophen; and Oxycodone-acetaminophen   Social History: The patient  reports that she has been smoking cigarettes. She started smoking about 43 years ago. She has a 7.50 pack-year smoking history. She has never used smokeless tobacco. She reports that she does not drink alcohol or use drugs.   Family History: The patient's family history includes Arthritis in her father; Cancer in her mother and sister; Deep vein thrombosis in her father; Heart attack (age  of onset: 21) in her mother; Heart disease in her mother; Varicose Veins in her father.   ROS:  Please see the history of present illness. Otherwise, complete review of systems is positive for {NONE DEFAULTED:18576::"none"}.  All other systems are reviewed and negative.   Physical Exam: VS:  There were no vitals taken for this visit., BMI There is no height or weight on file to calculate BMI.  Wt  Readings from Last 3 Encounters:  03/05/18 160 lb (72.6 kg)  12/25/17 160 lb (72.6 kg)  08/06/17 161 lb (73 kg)    General: Patient appears comfortable at rest. HEENT: Conjunctiva and lids normal, oropharynx clear with moist mucosa. Neck: Supple, no elevated JVP or carotid bruits, no thyromegaly. Lungs: Clear to auscultation, nonlabored breathing at rest. Cardiac: Regular rate and rhythm, no S3 or significant systolic murmur, no pericardial rub. Abdomen: Soft, nontender, no hepatomegaly, bowel sounds present, no guarding or rebound. Extremities: No pitting edema, distal pulses 2+. Skin: Warm and dry. Musculoskeletal: No kyphosis. Neuropsychiatric: Alert and oriented x3, affect grossly appropriate.  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  Recent Labwork:  January 2019: Cholesterol 200, triglycerides 92, HDL 40, LDL 141  Other Studies Reviewed Today:  Cardiac catheterization 02/15/2016:  Prox RCA-2 lesion, 75 %stenosed.  Prox RCA-1 lesion, 90 %stenosed.  Mid RCA-2 lesion, 85 %stenosed.  Mid RCA-1 lesion, 80 %stenosed.  Ost Ramus to Ramus lesion, 65 %stenosed.  Prox Cx to Mid Cx lesion, 75 %stenosed.  1st Diag lesion, 50 %stenosed.  Ost LAD lesion, 90 %stenosed.  The left ventricular ejection fraction is 55-65% by visual estimate.  The left ventricular systolic function is normal.  LV end diastolic pressure is normal.   Severe 2 vessel coronary disease involving the right coronary diffusely (relatively small vessel), and significant proximal LAD involvement. The circumflex contains moderate coronary disease including the site of previous angioplasty in the mid vessel at the time of infarction in 2007. Ramus has intermediate stenosis.  Normal left ventricular systolic function. LVEF at least 60% with normal EDP.  Severe peripheral vascular disease with right iliac stents and left  femoral-femoral bypass. Prior right brachial occlusion at the time of catheterization 2007 requiring thrombectomy by Dr. Bud Face.  Echocardiogram 02/16/2016: Study Conclusions  - Left ventricle: The cavity size was normal. Wall thickness was increased in a pattern of mild LVH. Systolic function was normal. The estimated ejection fraction was in the range of 60% to 65%. Wall motion was normal; there were no regional wall motion abnormalities. Left ventricular diastolic function parameters were normal. - Aortic valve: Trileaflet. Sclerosis without stenosis. There was mild regurgitation. - Left atrium: The atrium was normal in size. - Inferior vena cava: The vessel was normal in size. The respirophasic diameter changes were in the normal range (>= 50%), consistent with normal central venous pressure.  Impressions:  - LVEF 60-65%, mild LVH, normal wall motion, normal diastolic function, aortic valve sclerosis with mild AI, normal LA size, normal IVC.  Assessment and Plan:    Current medicines were reviewed with the patient today.  No orders of the defined types were placed in this encounter.   Disposition:  Signed, Jonelle Sidle, MD, [facility] 06/23/2018 12:08 PM    [facility] [address] [place], [place], [place] [address] Phone: [phone]; Fax: [phone]

## 2018-06-24 ENCOUNTER — Ambulatory Visit: Payer: Medicare Other | Admitting: Cardiology

## 2018-07-29 ENCOUNTER — Other Ambulatory Visit: Payer: Self-pay | Admitting: Cardiology

## 2018-08-31 ENCOUNTER — Other Ambulatory Visit: Payer: Self-pay | Admitting: Cardiology

## 2018-09-22 ENCOUNTER — Other Ambulatory Visit: Payer: Self-pay | Admitting: *Deleted

## 2018-09-22 MED ORDER — METOPROLOL TARTRATE 25 MG PO TABS: ORAL_TABLET | ORAL | 0 refills | Status: DC

## 2018-11-11 ENCOUNTER — Other Ambulatory Visit: Payer: Self-pay | Admitting: Cardiology

## 2019-01-04 ENCOUNTER — Ambulatory Visit (INDEPENDENT_AMBULATORY_CARE_PROVIDER_SITE_OTHER): Payer: Medicare Other | Admitting: Cardiology

## 2019-01-04 ENCOUNTER — Other Ambulatory Visit: Payer: Self-pay

## 2019-01-04 ENCOUNTER — Telehealth: Payer: Self-pay | Admitting: *Deleted

## 2019-01-04 ENCOUNTER — Encounter: Payer: Self-pay | Admitting: Cardiology

## 2019-01-04 ENCOUNTER — Other Ambulatory Visit: Payer: Self-pay | Admitting: *Deleted

## 2019-01-04 ENCOUNTER — Telehealth: Payer: Self-pay | Admitting: Cardiology

## 2019-01-04 ENCOUNTER — Encounter: Payer: Self-pay | Admitting: *Deleted

## 2019-01-04 VITALS — BP 118/64 | HR 56 | Temp 99.1°F | Ht 62.0 in | Wt 153.0 lb

## 2019-01-04 DIAGNOSIS — Z720 Tobacco use: Secondary | ICD-10-CM | POA: Diagnosis not present

## 2019-01-04 DIAGNOSIS — I739 Peripheral vascular disease, unspecified: Secondary | ICD-10-CM

## 2019-01-04 DIAGNOSIS — I25119 Atherosclerotic heart disease of native coronary artery with unspecified angina pectoris: Secondary | ICD-10-CM | POA: Diagnosis not present

## 2019-01-04 DIAGNOSIS — E782 Mixed hyperlipidemia: Secondary | ICD-10-CM

## 2019-01-04 MED ORDER — REPATHA 140 MG/ML ~~LOC~~ SOSY: 140.0000 mg | PREFILLED_SYRINGE | SUBCUTANEOUS | 3 refills | Status: DC

## 2019-01-04 MED ORDER — PRALUENT 75 MG/ML ~~LOC~~ SOAJ: 75.0000 mg | SUBCUTANEOUS | 1 refills | Status: DC

## 2019-01-04 MED ORDER — NITROGLYCERIN 0.4 MG SL SUBL: 0.4000 mg | SUBLINGUAL_TABLET | SUBLINGUAL | 3 refills | Status: DC | PRN

## 2019-01-04 MED ORDER — METOPROLOL TARTRATE 25 MG PO TABS: ORAL_TABLET | ORAL | 1 refills | Status: DC

## 2019-01-04 NOTE — Progress Notes (Signed)
Cardiology Office Note  Date: 01/04/2019   ID: Kelsey Hansen, DOB [birth year], MRN [id number]  PCP:  Patient, No Pcp Per  Cardiologist:  Nona DellSamuel Billi Bright, MD Electrophysiologist:  None   Chief Complaint  Patient presents with  . Coronary Artery Disease    History of Present Illness: Kelsey Hansen is a 53 y.o. female last seen in July 2019.  She presents overdue for follow-up.  She does not report any change in stamina or increasing shortness of breath.  She does have intermittent angina symptoms, resolved with a single sublingual nitroglycerin.  This pattern has not increased.  She states that she has not been walking as much for exercise.  Last visit with [facility] was in September 2019.  I asked her to make sure that she has follow-up this year as well.  She reports stable claudication symptoms.  She has cut back cigarette usage but has not quit.  We have discussed various smoking cessation options.  This has been very difficult for her.  I personally reviewed her ECG today which shows sinus bradycardia with possible old anteroseptal infarct pattern.  I reviewed her medications which are outlined below.  She tells me that she was not able to tolerate Zetia, reports fatigue and leg swelling.  She has a documented multi-statin intolerance as well.  I did talk with her about the possibility of Repatha today.  Past Medical History:  Diagnosis Date  . Anxiety   . 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 Coronary atherosclerosis of native coronary artery    Multivessel  . Hyperlipidemia   . NSTEMI (non-ST elevated myocardial infarction) (HCC) 11/07   PTCA of distal circumflex 2007, residual 50% LAD and 80% PDA  . Peripheral arterial disease (HCC)    Right common and external iliac stent, right to left fem-fem bypass    Past Surgical History:  Procedure Laterality Date  . CARDIAC CATHETERIZATION  02/15/2016  . CARDIAC  CATHETERIZATION N/A 02/15/2016   Procedure: Left Heart Cath and Coronary Angiography;  Surgeon: Lyn RecordsHenry W Smith, MD;  [facility];  Service: Cardiovascular;  Laterality: N/A;  . CORONARY ARTERY BYPASS GRAFT N/A 02/20/2016   Procedure: CORONARY ARTERY BYPASS GRAFTING (CABG)x three,  using left internal mammary artery and right greater saphenous leg vein using endoscope.;  Surgeon: Kerin PernaPeter Van Trigt, MD;  [facility];  Service: Open Heart Surgery;  Laterality: N/A;  . FEMORAL BYPASS  7/08   Right to left; followed by right external iliac artery stent placed 12/08  . PR VEIN BYPASS GRAFT,AORTO-FEM-POP    . SP US TRANSCATHETER OCCLUSION     Occlusion off her fem-fem bypass with claudication in both legs  . TEE WITHOUT CARDIOVERSION N/A 02/20/2016   Procedure: TRANSESOPHAGEAL ECHOCARDIOGRAM (TEE);  Surgeon: Kerin PernaPeter Van Trigt, MD;  [facility];  Service: Open Heart Surgery;  Laterality: N/A;    Current Outpatient Medications  Medication Sig Dispense Refill  . aspirin EC 81 MG tablet Take 81 mg by mouth daily.     . metoprolol tartrate (LOPRESSOR) 25 MG tablet TAKE 1/2 TABLET BY MOUTH TWO TIMES A DAY 90 tablet 1  . nitroGLYCERIN (NITROSTAT) 0.4 MG SL tablet Place 1 tablet (0.4 mg total) under the tongue every 5 (five) minutes as needed for chest pain. 25 tablet 3  . PLAVIX 75 MG tablet TAKE 1 TABLET (75 MG TOTAL) BY MOUTH DAILY. 90 tablet 1  . acetaminophen (TYLENOL) 500  MG tablet Take 500 mg by mouth every 6 (six) hours as needed.    . Evolocumab (REPATHA) 140 MG/ML SOSY Inject 140 mg into the skin every 14 (fourteen) days. 2.1 mL 3   No current facility-administered medications for this visit.    Allergies:  Aleve [naproxen], Crestor [rosuvastatin], Lipitor [atorvastatin], Pravachol [pravastatin], Zocor [simvastatin], Hydrocodone-acetaminophen, and Oxycodone-acetaminophen   Social History: The patient  reports that she has been smoking cigarettes. She started smoking about 43 years  ago. She has a 7.50 pack-year smoking history. She has never used smokeless tobacco. She reports that she does not drink alcohol or use drugs.   ROS:  Please see the history of present illness. Otherwise, complete review of systems is positive for none.  All other systems are reviewed and negative.   Physical Exam: VS:  BP 118/64   Pulse (!) 56   Temp 99.1 F (37.3 C)   Ht 5\' 2"  (1.575 m)   Wt 153 lb (69.4 kg)   SpO2 98%   BMI 27.98 kg/m , BMI Body mass index is 27.98 kg/m.  Wt Readings from Last 3 Encounters:  01/04/19 153 lb (69.4 kg)  03/05/18 160 lb (72.6 kg)  12/25/17 160 lb (72.6 kg)    General: Patient appears comfortable at rest. HEENT: Conjunctiva and lids normal, wearing a mask. Neck: Supple, no elevated JVP or carotid bruits, no thyromegaly. Lungs: Clear to auscultation, nonlabored breathing at rest. Cardiac: Regular rate and rhythm, no S3 or significant systolic murmur, no pericardial rub. Abdomen: Soft, nontender, bowel sounds present. Extremities: No pitting edema, distal pulses 2+. Skin: Warm and dry. Musculoskeletal: No kyphosis. Neuropsychiatric: Alert and oriented x3, affect grossly appropriate.  ECG:  An ECG dated 06/17/2017 was personally reviewed today and demonstrated:  Sinus bradycardia with old anteroseptal infarct.  Recent Labwork:  January 2019: Cholesterol 200, triglycerides 92, HDL 40, LDL 141  Other Studies Reviewed Today:  Cardiac catheterization 02/15/2016:  Prox RCA-2 lesion, 75 %stenosed.  Prox RCA-1 lesion, 90 %stenosed.  Mid RCA-2 lesion, 85 %stenosed.  Mid RCA-1 lesion, 80 %stenosed.  Ost Ramus to Ramus lesion, 65 %stenosed.  Prox Cx to Mid Cx lesion, 75 %stenosed.  1st Diag lesion, 50 %stenosed.  Ost LAD lesion, 90 %stenosed.  The left ventricular ejection fraction is 55-65% by visual estimate.  The left ventricular systolic function is normal.  LV end diastolic pressure is normal.   Severe 2 vessel coronary disease  involving the right coronary diffusely (relatively small vessel), and significant proximal LAD involvement. The circumflex contains moderate coronary disease including the site of previous angioplasty in the mid vessel at the time of infarction in 2007. Ramus has intermediate stenosis.  Normal left ventricular systolic function. LVEF at least 60% with normal EDP.  Severe peripheral vascular disease with right iliac stents and left femoral-femoral bypass. Prior right brachial occlusion at the time of catheterization 2007 requiring thrombectomy by Dr. Bud FaceGregory Hayes.  Echocardiogram 02/16/2016: Study Conclusions  - Left ventricle: The cavity size was normal. Wall thickness was increased in a pattern of mild LVH. Systolic function was normal. The estimated ejection fraction was in the range of 60% to 65%. Wall motion was normal; there were no regional wall motion abnormalities. Left ventricular diastolic function parameters were normal. - Aortic valve: Trileaflet. Sclerosis without stenosis. There was mild regurgitation. - Left atrium: The atrium was normal in size. - Inferior vena cava: The vessel was normal in size. The respirophasic diameter changes were in the normal range (>= 50%),  consistent with normal central venous pressure.  Impressions:  - LVEF 60-65%, mild LVH, normal wall motion, normal diastolic function, aortic valve sclerosis with mild AI, normal LA size, normal IVC.  Lower extremity ABIs 03/05/2018: Final Interpretation: Right: Resting right ankle-brachial index indicates mild right lower extremity arterial disease. The right toe-brachial index is abnormal. RT great toe pressure = 62 mmHg. PPG tracings appear dampened.  Left: Resting left ankle-brachial index indicates moderate left lower extremity arterial disease. The left toe-brachial index is abnormal. LT Great toe pressure = 46 mmHg. PPG tracings appear dampened.  Assessment and Plan:  1.   Multivessel CAD status post CABG in September 2017.  She remains on dual antiplatelet regimen and has as needed nitroglycerin available.  No increasing angina symptoms.  Continue beta-blocker.  I also talked with her again about a walking regimen for exercise.  2.  Longstanding tobacco abuse.  We discussed smoking cessation strategies again today.  She has cut back but not quit.  3.  Mixed hyperlipidemia with multi-statin intolerance and also intolerance to Zetia.  We will check into possibility of Repatha.  4.  PAD with stable claudication.  Keep follow-up with [facility].  Medication Adjustments/Labs and Tests Ordered: Current medicines are reviewed at length with the patient today.  Concerns regarding medicines are outlined above.   Tests Ordered: Orders Placed This Encounter  Procedures  . EKG 12-Lead    Medication Changes: Meds ordered this encounter  Medications  . metoprolol tartrate (LOPRESSOR) 25 MG tablet    Sig: TAKE 1/2 TABLET BY MOUTH TWO TIMES A DAY    Dispense:  90 tablet    Refill:  1  . nitroGLYCERIN (NITROSTAT) 0.4 MG SL tablet    Sig: Place 1 tablet (0.4 mg total) under the tongue every 5 (five) minutes as needed for chest pain.    Dispense:  25 tablet    Refill:  3  . Evolocumab (REPATHA) 140 MG/ML SOSY    Sig: Inject 140 mg into the skin every 14 (fourteen) days.    Dispense:  2.1 mL    Refill:  3    01/04/2019 NEW    Disposition:  Follow up 6 months in the [place] office.  Signed, Satira Sark, MD, [facility] 01/04/2019 10:05 AM    [place] [facility] [place], [place], [place] [address] Phone: [phone]; Fax: [phone]

## 2019-01-04 NOTE — Telephone Encounter (Signed)
Per covermymeds prior auth for repatha, praluent is preferred and repatha not covered unless tried praluent. Per Domenic Polite, okay to change to praluent. [place]

## 2019-01-04 NOTE — Patient Instructions (Addendum)
Medication Instructions:    Your physician recommends that you continue on your current medications as directed. Please refer to the Current Medication list given to you today.  Repatha prescription has been sent to your pharmacy to determine your coverage. We will let you know if its approved so that you can start this medication for your cholesterol.  Labwork:  NONE  Testing/Procedures:  NONE  Follow-Up:  Your physician recommends that you schedule a follow-up appointment in: 6 months. You will receive a reminder letter in the mail in about 4 months reminding you to call and schedule your appointment. If you don't receive this letter, please contact our office.  Any Other Special Instructions Will Be Listed Below (If Applicable).  If you need a refill on your cardiac medications before your next appointment, please call your pharmacy.

## 2019-01-04 NOTE — Telephone Encounter (Signed)
Patient aware of change and agrees that repatha changed to praluent.

## 2019-01-04 NOTE — Telephone Encounter (Signed)
Notification received from [place] that praluent approved from 10/06/2018-01/04/2020. [employer] and patient notified. Copay $8.95/mth.

## 2019-01-04 NOTE — Telephone Encounter (Signed)
See previous phone note for details 

## 2019-01-04 NOTE — Telephone Encounter (Signed)
Stated she was returning Lydia's call

## 2019-04-05 ENCOUNTER — Telehealth: Payer: Self-pay | Admitting: Cardiology

## 2019-04-05 DIAGNOSIS — E782 Mixed hyperlipidemia: Secondary | ICD-10-CM

## 2019-04-05 DIAGNOSIS — Z789 Other specified health status: Secondary | ICD-10-CM

## 2019-04-05 NOTE — Telephone Encounter (Signed)
I presume that you mean Praluent, referring to injections.  If she is having the symptoms described, I would agree with stopping the medication.  If she would be willing to have a lipid clinic consultation that might be a good option.

## 2019-04-05 NOTE — Telephone Encounter (Signed)
Patient notified. She is agreeable to going to Lipid clinic.

## 2019-04-05 NOTE — Telephone Encounter (Signed)
Patient called stated she can not take the shots.  Tongue swelling. Feet and hands swelling. - hair falling out

## 2019-04-05 NOTE — Telephone Encounter (Signed)
Been on x couple of months now - did not use the last 2 doses   Did notice everytime she did the injection. Sore throat, tongue swelling, feet & hands swelling.  Noticing a lot of hair falling out.  States that she has been off now x 1 month & all symptoms feeling a lot better.  No other new medications has been started since this time either.

## 2019-05-02 ENCOUNTER — Other Ambulatory Visit: Payer: Self-pay

## 2019-05-02 DIAGNOSIS — I779 Disorder of arteries and arterioles, unspecified: Secondary | ICD-10-CM

## 2019-05-02 DIAGNOSIS — I6523 Occlusion and stenosis of bilateral carotid arteries: Secondary | ICD-10-CM

## 2019-05-04 ENCOUNTER — Telehealth (HOSPITAL_COMMUNITY): Payer: Self-pay

## 2019-05-04 NOTE — Telephone Encounter (Signed)

## 2019-05-05 ENCOUNTER — Ambulatory Visit (HOSPITAL_COMMUNITY): Payer: Medicare Other | Attending: Vascular Surgery

## 2019-05-05 ENCOUNTER — Encounter (HOSPITAL_COMMUNITY): Payer: Medicare Other

## 2019-05-05 ENCOUNTER — Ambulatory Visit: Payer: Medicare Other | Admitting: Vascular Surgery

## 2019-05-05 ENCOUNTER — Encounter: Payer: Self-pay | Admitting: Family

## 2019-05-06 ENCOUNTER — Ambulatory Visit: Payer: Medicare Other

## 2019-05-11 ENCOUNTER — Other Ambulatory Visit: Payer: Self-pay | Admitting: Cardiology

## 2019-05-18 ENCOUNTER — Telehealth: Payer: Self-pay | Admitting: Cardiology

## 2019-05-18 NOTE — Telephone Encounter (Signed)
Having issue with Plavix order due to being told she needs an authorization. [place]

## 2019-05-18 NOTE — Telephone Encounter (Signed)
Pt aware that PA submitted for Plavix under cover my meds today

## 2019-05-31 ENCOUNTER — Ambulatory Visit: Payer: Medicare Other | Admitting: Internal Medicine

## 2019-05-31 ENCOUNTER — Encounter

## 2019-06-30 ENCOUNTER — Telehealth: Payer: Self-pay | Admitting: Cardiology

## 2019-06-30 NOTE — Telephone Encounter (Signed)
Virtual Visit Pre-Appointment Phone Call  "(Name), I am calling you today to discuss your upcoming appointment. We are currently trying to limit exposure to the virus that causes COVID-19 by seeing patients at home rather than in the office."  1. "What is the BEST phone number to call the day of the visit?" - include this in appointment notes  2. Do you have or have access to (through a family member/friend) a smartphone with video capability that we can use for your visit?" a. If yes - list this number in appt notes as cell (if different from BEST phone #) and list the appointment type as a VIDEO visit in appointment notes b. If no - list the appointment type as a PHONE visit in appointment notes  3. Confirm consent - "In the setting of the current Covid19 crisis, you are scheduled for a (phone or video) visit with your provider on (date) at (time).  Just as we do with many in-office visits, in order for you to participate in this visit, we must obtain consent.  If you'd like, I can send this to your mychart (if signed up) or email for you to review.  Otherwise, I can obtain your verbal consent now.  All virtual visits are billed to your insurance company just like a normal visit would be.  By agreeing to a virtual visit, we'd like you to understand that the technology does not allow for your provider to perform an examination, and thus may limit your provider's ability to fully assess your condition. If your provider identifies any concerns that need to be evaluated in person, we will make arrangements to do so.  Finally, though the technology is pretty good, we cannot assure that it will always work on either your or our end, and in the setting of a video visit, we may have to convert it to a phone-only visit.  In either situation, we cannot ensure that we have a secure connection.  Are you willing to proceed?" STAFF: Did the patient verbally acknowledge consent to telehealth visit? Document  YES/NO here: YES  4. Advise patient to be prepared - "Two hours prior to your appointment, go ahead and check your blood pressure, pulse, oxygen saturation, and your weight (if you have the equipment to check those) and write them all down. When your visit starts, your provider will ask you for this information. If you have an Apple Watch or Kardia device, please plan to have heart rate information ready on the day of your appointment. Please have a pen and paper handy nearby the day of the visit as well."  5. Give patient instructions for MyChart download to smartphone OR Doximity/Doxy.me as below if video visit (depending on what platform provider is using)  6. Inform patient they will receive a phone call 15 minutes prior to their appointment time (may be from unknown caller ID) so they should be prepared to answer    TELEPHONE CALL NOTE  Kelsey Hansen has been deemed a candidate for a follow-up tele-health visit to limit community exposure during the Covid-19 pandemic. I spoke with the patient via phone to ensure availability of phone/video source, confirm preferred email & phone number, and discuss instructions and expectations.  I reminded Kelsey Hansen to be prepared with any vital sign and/or heart rhythm information that could potentially be obtained via home monitoring, at the time of her visit. I reminded Kelsey Hansen to expect a phone call prior to  her visit.  Weston Anna 06/30/2019 3:22 PM   INSTRUCTIONS FOR DOWNLOADING THE MYCHART APP TO SMARTPHONE  - The patient must first make sure to have activated MyChart and know their login information - If Apple, go to [employer] and type in MyChart in the search bar and download the app. If Android, ask patient to go to [employer] and type in Konterra in the search bar and download the app. The app is free but as with any other app downloads, their phone may require them to verify saved payment information or Apple/Android  password.  - The patient will need to then log into the app with their MyChart username and password, and select  as their healthcare provider to link the account. When it is time for your visit, go to the MyChart app, find appointments, and click Begin Video Visit. Be sure to Select Allow for your device to access the Microphone and Camera for your visit. You will then be connected, and your provider will be with you shortly.  **If they have any issues connecting, or need assistance please contact MyChart service desk [phone])**  **If using a computer, in order to ensure the best quality for their visit they will need to use either of the following Internet Browsers: [employer], or Google Chrome**  IF USING DOXIMITY or DOXY.ME - The patient will receive a link just prior to their visit by text.     FULL LENGTH CONSENT FOR TELE-HEALTH VISIT   I hereby voluntarily request, consent and authorize [facility] and its employed or contracted physicians, physician assistants, nurse practitioners or other licensed health care professionals (the Practitioner), to provide me with telemedicine health care services (the Services") as deemed necessary by the treating Practitioner. I acknowledge and consent to receive the Services by the Practitioner via telemedicine. I understand that the telemedicine visit will involve communicating with the Practitioner through live audiovisual communication technology and the disclosure of certain medical information by electronic transmission. I acknowledge that I have been given the opportunity to request an in-person assessment or other available alternative prior to the telemedicine visit and am voluntarily participating in the telemedicine visit.  I understand that I have the right to withhold or withdraw my consent to the use of telemedicine in the course of my care at any time, without affecting my right to future care or treatment,  and that the Practitioner or I may terminate the telemedicine visit at any time. I understand that I have the right to inspect all information obtained and/or recorded in the course of the telemedicine visit and may receive copies of available information for a reasonable fee.  I understand that some of the potential risks of receiving the Services via telemedicine include:   Delay or interruption in medical evaluation due to technological equipment failure or disruption;  Information transmitted may not be sufficient (e.g. poor resolution of images) to allow for appropriate medical decision making by the Practitioner; and/or   In rare instances, security protocols could fail, causing a breach of personal health information.  Furthermore, I acknowledge that it is my responsibility to provide information about my medical history, conditions and care that is complete and accurate to the best of my ability. I acknowledge that Practitioner's advice, recommendations, and/or decision may be based on factors not within their control, such as incomplete or inaccurate data provided by me or distortions of diagnostic images or specimens that may result from electronic transmissions. I  understand that the practice of medicine is not an exact science and that Practitioner makes no warranties or guarantees regarding treatment outcomes. I acknowledge that I will receive a copy of this consent concurrently upon execution via email to the email address I last provided but may also request a printed copy by calling the office of [place].    I understand that my insurance will be billed for this visit.   I have read or had this consent read to me.  I understand the contents of this consent, which adequately explains the benefits and risks of the Services being provided via telemedicine.   I have been provided ample opportunity to ask questions regarding this consent and the Services and have had my questions  answered to my satisfaction.  I give my informed consent for the services to be provided through the use of telemedicine in my medical care  By participating in this telemedicine visit I agree to the above.

## 2019-07-05 NOTE — Progress Notes (Signed)
Virtual Visit via Telephone Note   This visit type was conducted due to national recommendations for restrictions regarding the COVID-19 Pandemic (e.g. social distancing) in an effort to limit this patient's exposure and mitigate transmission in our community.  Due to her co-morbid illnesses, this patient is at least at moderate risk for complications without adequate follow up.  This format is felt to be most appropriate for this patient at this time.  The patient did not have access to video technology/had technical difficulties with video requiring transitioning to audio format only (telephone).  All issues noted in this document were discussed and addressed.  No physical exam could be performed with this format.  Please refer to the patient's chart for her  consent to telehealth for [facility].   Date:  07/06/2019   ID:  Kelsey Hansen, DOB [birth year], MRN [id number]  Patient Location: Home Provider Location: Office  PCP:  Patient, No Pcp Per  Cardiologist:  Nona Dell, MD Electrophysiologist:  None   Evaluation Performed:  Follow-Up Visit  Chief Complaint:  Cardiac follow-up  History of Present Illness:    Kelsey Hansen is a 54 y.o. female last seen in July 2020.  We spoke by phone today.  She does not report any angina symptoms or nitroglycerin use.  She has been walking some around her house, not as active as she would like to be.  She is also trying to cut back on cigarettes and would like to quit eventually, but this has been very difficult for her.  Praluent was initiated in the interim although she had possible side effects as described in telephone note from October 2020.  Referral made to lipid clinic although she has not been seen.  She has been hesitant to try any new medications, intolerances to statins and Zetia.  She is overdue for follow-up carotid Dopplers and ABIs per [facility].  I asked her to make a follow-up visit for this year.  I reviewed her medications which  are stable from a cardiac perspective and outlined below.  She will get a refill for fresh bottle of nitroglycerin.  The patient does not have symptoms concerning for COVID-19 infection (fever, chills, cough, or new shortness of breath).    Past Medical History:  Diagnosis Date  . Anxiety   . 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 Coronary atherosclerosis of native coronary artery    Multivessel  . Hyperlipidemia   . NSTEMI (non-ST elevated myocardial infarction) (HCC) 11/07   PTCA of distal circumflex 2007, residual 50% LAD and 80% PDA  . Peripheral arterial disease (HCC)    Right common and external iliac stent, right to left fem-fem bypass   Past Surgical History:  Procedure Laterality Date  . CARDIAC CATHETERIZATION  02/15/2016  . CARDIAC CATHETERIZATION N/A 02/15/2016   Procedure: Left Heart Cath and Coronary Angiography;  Surgeon: Lyn Records, MD;  Location[facility] INVASIVE CV LAB;  Service: Cardiovascular;  Laterality: N/A;  . CORONARY ARTERY BYPASS GRAFT N/A 02/20/2016   Procedure: CORONARY ARTERY BYPASS GRAFTING (CABG)x three,  using left internal mammary artery and right greater saphenous leg vein using endoscope.;  Surgeon: Kerin Perna, MD;  Location: [facility] OR;  Service: Open Heart Surgery;  Laterality: N/A;  . FEMORAL BYPASS  7/08   Right to left; followed by right external iliac artery stent placed 12/08  . PR VEIN BYPASS GRAFT,AORTO-FEM-POP    . SP [place] TRANSCATHETER OCCLUSION  Occlusion off her fem-fem bypass with claudication in both legs  . TEE WITHOUT CARDIOVERSION N/A 02/20/2016   Procedure: TRANSESOPHAGEAL ECHOCARDIOGRAM (TEE);  Surgeon: Kerin Perna, MD;  Location[facility] OR;  Service: Open Heart Surgery;  Laterality: N/A;     Current Meds  Medication Sig  . acetaminophen (TYLENOL) 500 MG tablet Take 500 mg by mouth every 6 (six) hours as needed.  Marland Kitchen aspirin EC 81 MG tablet Take 81 mg by mouth daily.   .  diphenhydrAMINE (BENADRYL) 25 MG tablet Take 25 mg by mouth every 6 (six) hours as needed.  . metoprolol tartrate (LOPRESSOR) 25 MG tablet TAKE 1/2 TABLET BY MOUTH TWO TIMES A DAY  . nitroGLYCERIN (NITROSTAT) 0.4 MG SL tablet Place 1 tablet (0.4 mg total) under the tongue every 5 (five) minutes x 3 doses as needed for chest pain (if no relief after 3rd dose, proceed to the ED for an evaluation or call 911).  Marland Kitchen PLAVIX 75 MG tablet TAKE 1 TABLET (75 MG TOTAL) BY MOUTH DAILY.     Allergies:   Aleve [naproxen], Crestor [rosuvastatin], Lipitor [atorvastatin], Pravachol [pravastatin], Zocor [simvastatin], Clopidogrel, Zetia [ezetimibe], Hydrocodone-acetaminophen, and Oxycodone-acetaminophen   Social History   Tobacco Use  . Smoking status: Current Every Day Smoker    Packs/day: 0.25    Years: 30.00    Pack years: 7.50    Types: Cigarettes    Start date: 01/27/1975  . Smokeless tobacco: Never Used  . Tobacco comment: 10 cigarettes per day  Substance Use Topics  . Alcohol use: No  . Drug use: No     Family Hx: The patient's family history includes Arthritis in her father; Cancer in her mother and sister; Deep vein thrombosis in her father; Heart attack (age of onset: 25) in her mother; Heart disease in her mother; Varicose Veins in her father. There is no history of Diabetes, Hypertension, or Coronary artery disease.  ROS:   Please see the history of present illness. All other systems reviewed and are negative.   Prior CV studies:   The following studies were reviewed today:  Cardiac catheterization 02/15/2016:  Prox RCA-2 lesion, 75 %stenosed.  Prox RCA-1 lesion, 90 %stenosed.  Mid RCA-2 lesion, 85 %stenosed.  Mid RCA-1 lesion, 80 %stenosed.  Ost Ramus to Ramus lesion, 65 %stenosed.  Prox Cx to Mid Cx lesion, 75 %stenosed.  1st Diag lesion, 50 %stenosed.  Ost LAD lesion, 90 %stenosed.  The left ventricular ejection fraction is 55-65% by visual estimate.  The left  ventricular systolic function is normal.  LV end diastolic pressure is normal.   Severe 2 vessel coronary disease involving the right coronary diffusely (relatively small vessel), and significant proximal LAD involvement. The circumflex contains moderate coronary disease including the site of previous angioplasty in the mid vessel at the time of infarction in 2007. Ramus has intermediate stenosis.  Normal left ventricular systolic function. LVEF at least 60% with normal EDP.  Severe peripheral vascular disease with right iliac stents and left femoral-femoral bypass. Prior right brachial occlusion at the time of catheterization 2007 requiring thrombectomy by Dr. Bud Face.  Echocardiogram 02/16/2016: Study Conclusions  - Left ventricle: The cavity size was normal. Wall thickness was increased in a pattern of mild LVH. Systolic function was normal. The estimated ejection fraction was in the range of 60% to 65%. Wall motion was normal; there were no regional wall motion abnormalities. Left ventricular diastolic function parameters were normal. - Aortic valve: Trileaflet. Sclerosis without stenosis. There was  mild regurgitation. - Left atrium: The atrium was normal in size. - Inferior vena cava: The vessel was normal in size. The respirophasic diameter changes were in the normal range (>= 50%), consistent with normal central venous pressure.  Impressions:  - LVEF 60-65%, mild LVH, normal wall motion, normal diastolic function, aortic valve sclerosis with mild AI, normal LA size, normal IVC.  Lower extremity ABIs 03/05/2018: Final Interpretation: Right: Resting right ankle-brachial index indicates mild right lower extremity arterial disease. The right toe-brachial index is abnormal. RT great toe pressure = 62 mmHg. PPG tracings appear dampened.  Left: Resting left ankle-brachial index indicates moderate left lower extremity arterial disease. The left  toe-brachial index is abnormal. LT Great toe pressure = 46 mmHg. PPG tracings appear dampened.   Labs/Other Tests and Data Reviewed:    EKG:  An ECG dated 01/04/2019 was personally reviewed today and demonstrated:  Sinus bradycardia with possible old anteroseptal infarct pattern.  Recent Labs:  January 2019: Cholesterol 200, triglycerides 92, HDL 40, LDL 141  Wt Readings from Last 3 Encounters:  07/06/19 150 lb (68 kg)  01/04/19 153 lb (69.4 kg)  03/05/18 160 lb (72.6 kg)     Objective:    Vital Signs:  Ht 5\' 2"  (1.575 m)   Wt 150 lb (68 kg)   BMI 27.44 kg/m    Patient spoke in full sentences, not short of breath. No audible wheezing or coughing. Speech pattern normal.  ASSESSMENT & PLAN:    1.  Multivessel CAD status post CABG in September 2017.  She does not report any active angina, plans to get a refill for a fresh bottle of nitroglycerin.  She will continue on aspirin, Plavix, and Lopressor otherwise.  2.  Mixed hyperlipidemia with intolerance to statins, Zetia, and now Praluent.  She has been hesitant to try other medications, we will hold off for now and reevaluate as other preparations become more widely available.  I did talk to her about dietary measures.  3.  Longstanding tobacco abuse.  She has cut back but has not been able to quit as yet.  We continue to discuss smoking cessation.  4.  PAD.  She does not report any progressive claudication.  She is overdue for follow-up with [facility].  I asked her to schedule a visit.  COVID-19 Education: The signs and symptoms of COVID-19 were discussed with the patient and how to seek care for testing (follow up with PCP or arrange E-visit).  The importance of social distancing was discussed today.  Time:   Today, I have spent 8 minutes with the patient with telehealth technology discussing the above problems.     Medication Adjustments/Labs and Tests Ordered: Current medicines are reviewed at length with the patient today.   Concerns regarding medicines are outlined above.   Tests Ordered: No orders of the defined types were placed in this encounter.   Medication Changes: No orders of the defined types were placed in this encounter.   Follow Up:  In Person 6 months in the [place] office.  Signed, Rozann Lesches, MD  07/06/2019 8:39 AM    [place]

## 2019-07-06 ENCOUNTER — Telehealth (INDEPENDENT_AMBULATORY_CARE_PROVIDER_SITE_OTHER): Payer: Medicare Other | Admitting: Cardiology

## 2019-07-06 ENCOUNTER — Encounter: Payer: Self-pay | Admitting: Cardiology

## 2019-07-06 VITALS — Ht 62.0 in | Wt 150.0 lb

## 2019-07-06 DIAGNOSIS — I25119 Atherosclerotic heart disease of native coronary artery with unspecified angina pectoris: Secondary | ICD-10-CM | POA: Diagnosis not present

## 2019-07-06 DIAGNOSIS — I739 Peripheral vascular disease, unspecified: Secondary | ICD-10-CM

## 2019-07-06 DIAGNOSIS — E782 Mixed hyperlipidemia: Secondary | ICD-10-CM

## 2019-07-06 DIAGNOSIS — Z789 Other specified health status: Secondary | ICD-10-CM

## 2019-07-06 DIAGNOSIS — Z720 Tobacco use: Secondary | ICD-10-CM | POA: Diagnosis not present

## 2019-07-06 NOTE — Patient Instructions (Addendum)

## 2019-07-20 ENCOUNTER — Ambulatory Visit: Payer: Medicare Other | Admitting: Internal Medicine

## 2019-07-21 ENCOUNTER — Other Ambulatory Visit: Payer: Self-pay | Admitting: Cardiology

## 2019-08-15 ENCOUNTER — Other Ambulatory Visit: Payer: Self-pay | Admitting: Cardiology

## 2019-09-01 ENCOUNTER — Ambulatory Visit: Payer: Medicare Other | Admitting: Internal Medicine

## 2019-10-27 ENCOUNTER — Ambulatory Visit: Payer: Medicare Other | Admitting: Internal Medicine

## 2020-01-05 ENCOUNTER — Encounter: Payer: Self-pay | Admitting: Surgery

## 2020-01-06 ENCOUNTER — Other Ambulatory Visit: Payer: Self-pay

## 2020-01-06 ENCOUNTER — Ambulatory Visit (INDEPENDENT_AMBULATORY_CARE_PROVIDER_SITE_OTHER): Payer: Medicare Other | Admitting: Cardiology

## 2020-01-06 ENCOUNTER — Encounter: Payer: Self-pay | Admitting: Cardiology

## 2020-01-06 VITALS — BP 128/72 | HR 54 | Ht 62.0 in | Wt 148.6 lb

## 2020-01-06 DIAGNOSIS — Z720 Tobacco use: Secondary | ICD-10-CM

## 2020-01-06 DIAGNOSIS — I25119 Atherosclerotic heart disease of native coronary artery with unspecified angina pectoris: Secondary | ICD-10-CM | POA: Diagnosis not present

## 2020-01-06 DIAGNOSIS — E782 Mixed hyperlipidemia: Secondary | ICD-10-CM

## 2020-01-06 DIAGNOSIS — I10 Essential (primary) hypertension: Secondary | ICD-10-CM

## 2020-01-06 MED ORDER — METOPROLOL TARTRATE 25 MG PO TABS: 12.5000 mg | ORAL_TABLET | Freq: Two times a day (BID) | ORAL | 2 refills | Status: DC

## 2020-01-06 MED ORDER — PLAVIX 75 MG PO TABS: ORAL_TABLET | ORAL | 2 refills | Status: DC

## 2020-01-06 NOTE — Progress Notes (Signed)
Cardiology Office Note  Date: 01/06/2020   ID: Kelsey Hansen, DOB Feb 17, 1966, MRN [id number]  PCP:  Patient, No Pcp Per  Cardiologist:  Nona Dell, MD Electrophysiologist:  None   Chief Complaint  Patient presents with  . Cardiac follow-up    History of Present Illness: Kelsey Hansen is a 54 y.o. female last assessed via telehealth encounter in January.  She presents for a routine visit.  States that she has used a single nitroglycerin tablet occasionally since last visit, not even once a month.  I reviewed her medications which are outlined below.  Refills being provided for Lopressor and Plavix.  I personally reviewed her ECG today which shows sinus bradycardia with left atrial enlargement, possible old anteroseptal infarct and similar to previous tracing.  She has had intolerances to statins, Zetia, and Praluent.  Has not had assessment of the lipid clinic as yet.  We discussed getting a follow-up fasting lipid profile and proceeding from there.  Past Medical History:  Diagnosis Date  . Anxiety   . 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 Coronary atherosclerosis of native coronary artery    Multivessel  . Hyperlipidemia   . NSTEMI (non-ST elevated myocardial infarction) (HCC) 11/07   PTCA of distal circumflex 2007, residual 50% LAD and 80% PDA  . Peripheral arterial disease (HCC)    Right common and external iliac stent, right to left fem-fem bypass    Past Surgical History:  Procedure Laterality Date  . CARDIAC CATHETERIZATION  02/15/2016  . CARDIAC CATHETERIZATION N/A 02/15/2016   Procedure: Left Heart Cath and Coronary Angiography;  Surgeon: Lyn Records, MD;  Location: [facility] INVASIVE CV LAB;  Service: Cardiovascular;  Laterality: N/A;  . CORONARY ARTERY BYPASS GRAFT N/A 02/20/2016   Procedure: CORONARY ARTERY BYPASS GRAFTING (CABG)x three,  using left internal mammary artery and right greater saphenous leg  vein using endoscope.;  Surgeon: Kerin Perna, MD;  Location: [facility] OR;  Service: Open Heart Surgery;  Laterality: N/A;  . FEMORAL BYPASS  7/08   Right to left; followed by right external iliac artery stent placed 12/08  . PR VEIN BYPASS GRAFT,AORTO-FEM-POP    . SP [place] TRANSCATHETER OCCLUSION     Occlusion off her fem-fem bypass with claudication in both legs  . TEE WITHOUT CARDIOVERSION N/A 02/20/2016   Procedure: TRANSESOPHAGEAL ECHOCARDIOGRAM (TEE);  Surgeon: Kerin Perna, MD;  Location: [facility] OR;  Service: Open Heart Surgery;  Laterality: N/A;    Current Outpatient Medications  Medication Sig Dispense Refill  . acetaminophen (TYLENOL) 500 MG tablet Take 500 mg by mouth every 6 (six) hours as needed.    Marland Kitchen aspirin EC 81 MG tablet Take 81 mg by mouth daily.     . diphenhydrAMINE (BENADRYL) 25 MG tablet Take 25 mg by mouth every 6 (six) hours as needed.    . metoprolol tartrate (LOPRESSOR) 25 MG tablet Take 0.5 tablets (12.5 mg total) by mouth 2 (two) times daily. 90 tablet 2  . nitroGLYCERIN (NITROSTAT) 0.4 MG SL tablet Place 1 tablet (0.4 mg total) under the tongue every 5 (five) minutes x 3 doses as needed for chest pain (if no relief after 3rd dose, proceed to the ED for an evaluation or call 911). 25 tablet 3  . PLAVIX 75 MG tablet TAKE 1 TABLET (75 MG TOTAL) BY MOUTH DAILY. 90 tablet 2   No current facility-administered medications for this visit.   Allergies:  Aleve [  naproxen], Crestor [rosuvastatin], Lipitor [atorvastatin], Pravachol [pravastatin], Zocor [simvastatin], Clopidogrel, Zetia [ezetimibe], Hydrocodone-acetaminophen, and Oxycodone-acetaminophen   ROS:   Recent transient mild left ankle edema.  Physical Exam: VS:  BP 128/72   Pulse 54   Ht 5\' 2"  (1.575 m)   Wt 148 lb 9.6 oz (67.4 kg)   SpO2 96%   BMI 27.18 kg/m , BMI Body mass index is 27.18 kg/m.  Wt Readings from Last 3 Encounters:  01/06/20 148 lb 9.6 oz (67.4 kg)  07/06/19 150 lb (68 kg)  01/04/19 153 lb (69.4  kg)    General: Patient appears comfortable at rest. HEENT: Conjunctiva and lids normal, wearing a mask. Neck: Supple, no elevated JVP or carotid bruits, no thyromegaly. Lungs: Clear to auscultation, nonlabored breathing at rest. Cardiac: Regular rate and rhythm, no S3 or significant systolic murmur, no pericardial rub. Extremities: No pitting edema, distal pulses diminished.  ECG:  An ECG dated 01/04/2019 was personally reviewed today and demonstrated:  Sinus bradycardia with possible old anteroseptal infarct pattern.  Recent Labwork:  January 2019: Cholesterol 200, triglycerides 92, HDL 40, LDL 141  Other Studies Reviewed Today:  Cardiac catheterization 02/15/2016:  Prox RCA-2 lesion, 75 %stenosed.  Prox RCA-1 lesion, 90 %stenosed.  Mid RCA-2 lesion, 85 %stenosed.  Mid RCA-1 lesion, 80 %stenosed.  Ost Ramus to Ramus lesion, 65 %stenosed.  Prox Cx to Mid Cx lesion, 75 %stenosed.  1st Diag lesion, 50 %stenosed.  Ost LAD lesion, 90 %stenosed.  The left ventricular ejection fraction is 55-65% by visual estimate.  The left ventricular systolic function is normal.  LV end diastolic pressure is normal.   Severe 2 vessel coronary disease involving the right coronary diffusely (relatively small vessel), and significant proximal LAD involvement. The circumflex contains moderate coronary disease including the site of previous angioplasty in the mid vessel at the time of infarction in 2007. Ramus has intermediate stenosis.  Normal left ventricular systolic function. LVEF at least 60% with normal EDP.  Severe peripheral vascular disease with right iliac stents and left femoral-femoral bypass. Prior right brachial occlusion at the time of catheterization 2007 requiring thrombectomy by Dr. 2008.  Echocardiogram 02/16/2016: Study Conclusions  - Left ventricle: The cavity size was normal. Wall thickness was increased in a pattern of mild LVH. Systolic function was  normal. The estimated ejection fraction was in the range of 60% to 65%. Wall motion was normal; there were no regional wall motion abnormalities. Left ventricular diastolic function parameters were normal. - Aortic valve: Trileaflet. Sclerosis without stenosis. There was mild regurgitation. - Left atrium: The atrium was normal in size. - Inferior vena cava: The vessel was normal in size. The respirophasic diameter changes were in the normal range (>= 50%), consistent with normal central venous pressure.  Impressions:  - LVEF 60-65%, mild LVH, normal wall motion, normal diastolic function, aortic valve sclerosis with mild AI, normal LA size, normal IVC.  Lower extremity ABIs 03/05/2018: Final Interpretation: Right: Resting right ankle-brachial index indicates mild right lower extremity arterial disease. The right toe-brachial index is abnormal. RT great toe pressure = 62 mmHg. PPG tracings appear dampened.  Left: Resting left ankle-brachial index indicates moderate left lower extremity arterial disease. The left toe-brachial index is abnormal. LT Great toe pressure = 46 mmHg. PPG tracings appear dampened.  Assessment and Plan:  1.  Multivessel CAD status post CABG in 2017.  She does not describe any progressive angina symptoms or nitroglycerin use since last assessment.  Continue medical therapy and observation, currently  on aspirin, Lopressor, Plavix, and as needed nitroglycerin.  ECG reviewed and stable.  2.  Mixed hyperlipidemia with intolerances to statins, Zetia, and Praluent.  We will get a follow-up FLP and keep referral to the lipid clinic.  3.  Longstanding tobacco abuse.  We have discussed smoking cessation over time.  She has not been able to quit so far.  Medication Adjustments/Labs and Tests Ordered: Current medicines are reviewed at length with the patient today.  Concerns regarding medicines are outlined above.   Tests Ordered: Orders Placed This  Encounter  Procedures  . Basic metabolic panel  . Lipid panel  . EKG 12-Lead    Medication Changes: Meds ordered this encounter  Medications  . metoprolol tartrate (LOPRESSOR) 25 MG tablet    Sig: Take 0.5 tablets (12.5 mg total) by mouth 2 (two) times daily.    Dispense:  90 tablet    Refill:  2  . PLAVIX 75 MG tablet    Sig: TAKE 1 TABLET (75 MG TOTAL) BY MOUTH DAILY.    Dispense:  90 tablet    Refill:  2    Disposition:  Follow up 6 months in the [place] office.  Signed, Jonelle Sidle, MD, [facility] 01/06/2020 2:03 PM    [facility] at [facility] [address] [place], [place], [place] [address] Phone: [phone]; Fax: [phone]

## 2020-01-06 NOTE — Patient Instructions (Addendum)
Medication Instructions:   Your physician recommends that you continue on your current medications as directed. Please refer to the Current Medication list given to you today.  Labwork: Your physician recommends that you return for a FASTING lipid profile and BMET as soon as possible. Please do not eat or drink for at least 8 hours when you have this done. You may take your medications that morning with a sip of water.  This may be done at [facility] or [facility] ([address] [place]) Monday-Friday from 8:00 am - 4:00 pm. No appointment is needed.   Testing/Procedures:  NONE  Follow-Up:  Your physician recommends that you schedule a follow-up appointment in: 6 months (office). You will receive a reminder letter in the mail in about 4 months reminding you to call and schedule your appointment. If you don't receive this letter, please contact our office.  Any Other Special Instructions Will Be Listed Below (If Applicable).  If you need a refill on your cardiac medications before your next appointment, please call your pharmacy.

## 2020-01-13 ENCOUNTER — Other Ambulatory Visit (HOSPITAL_COMMUNITY)
Admission: RE | Admit: 2020-01-13 | Discharge: 2020-01-13 | Disposition: A | Discharge status: Home / Self Care | Payer: Medicare Other | Source: Ambulatory Visit | Attending: Cardiology | Admitting: Cardiology

## 2020-01-13 ENCOUNTER — Other Ambulatory Visit: Payer: Self-pay

## 2020-01-13 DIAGNOSIS — I25119 Atherosclerotic heart disease of native coronary artery with unspecified angina pectoris: Secondary | ICD-10-CM | POA: Insufficient documentation

## 2020-01-13 DIAGNOSIS — E782 Mixed hyperlipidemia: Secondary | ICD-10-CM | POA: Insufficient documentation

## 2020-01-13 LAB — BASIC METABOLIC PANEL
Anion gap: 9 (ref 5–15)
BUN: 12 mg/dL (ref 6–20)
CO2: 27 mmol/L (ref 22–32)
Calcium: 9.3 mg/dL (ref 8.9–10.3)
Chloride: 101 mmol/L (ref 98–111)
Creatinine, Ser: 0.62 mg/dL (ref 0.44–1.00)
GFR calc Af Amer: >60 mL/min (ref >60)
GFR calc non Af Amer: >60 mL/min (ref >60)
Glucose, Bld: 120 mg/dL — ABNORMAL HIGH (ref 70–99)
Potassium: 3.7 mmol/L (ref 3.5–5.1)
Sodium: 137 mmol/L (ref 135–145)

## 2020-01-13 LAB — LIPID PANEL
Cholesterol: 242 mg/dL — ABNORMAL HIGH (ref 0–200)
HDL: 39 mg/dL — ABNORMAL LOW (ref >40)
LDL Cholesterol: 167 mg/dL — ABNORMAL HIGH (ref 0–99)
Total CHOL/HDL Ratio: 6.2 RATIO
Triglycerides: 181 mg/dL — ABNORMAL HIGH (ref <150)
VLDL: 36 mg/dL (ref 0–40)

## 2020-01-16 ENCOUNTER — Telehealth: Payer: Self-pay | Admitting: *Deleted

## 2020-01-16 DIAGNOSIS — E782 Mixed hyperlipidemia: Secondary | ICD-10-CM

## 2020-01-16 DIAGNOSIS — Z789 Other specified health status: Secondary | ICD-10-CM

## 2020-01-16 DIAGNOSIS — I2510 Atherosclerotic heart disease of native coronary artery without angina pectoris: Secondary | ICD-10-CM

## 2020-01-16 NOTE — Telephone Encounter (Signed)
-----   Message from Jonelle Sidle, MD sent at 01/13/2020 11:53 AM EDT ----- Results reviewed.  Total cholesterol 242 and LDL 167.  As per recent office note she has history of statin intolerance, intolerance to Zetia and also Praluent.  Please have her seen in the lipid clinic if possible.

## 2020-01-16 NOTE — Telephone Encounter (Signed)
Patient informed and verbalized understanding of plan. 

## 2020-02-27 ENCOUNTER — Other Ambulatory Visit: Payer: Self-pay

## 2020-02-27 DIAGNOSIS — I739 Peripheral vascular disease, unspecified: Secondary | ICD-10-CM

## 2020-02-27 DIAGNOSIS — I2510 Atherosclerotic heart disease of native coronary artery without angina pectoris: Secondary | ICD-10-CM

## 2020-03-09 ENCOUNTER — Ambulatory Visit (HOSPITAL_COMMUNITY)
Admission: RE | Admit: 2020-03-09 | Discharge: 2020-03-09 | Disposition: A | Discharge status: Home / Self Care | Payer: Medicare Other | Source: Ambulatory Visit | Attending: Vascular Surgery | Admitting: Vascular Surgery

## 2020-03-09 ENCOUNTER — Other Ambulatory Visit: Payer: Self-pay

## 2020-03-09 ENCOUNTER — Ambulatory Visit (INDEPENDENT_AMBULATORY_CARE_PROVIDER_SITE_OTHER)
Admission: RE | Admit: 2020-03-09 | Discharge: 2020-03-09 | Disposition: A | Discharge status: Home / Self Care | Payer: Medicare Other | Source: Ambulatory Visit | Attending: Vascular Surgery | Admitting: Vascular Surgery

## 2020-03-09 ENCOUNTER — Ambulatory Visit (INDEPENDENT_AMBULATORY_CARE_PROVIDER_SITE_OTHER): Payer: Medicare Other | Admitting: Physician Assistant

## 2020-03-09 VITALS — BP 152/76 | HR 54 | Temp 98.5°F | Resp 14 | Ht 63.0 in | Wt 145.0 lb

## 2020-03-09 DIAGNOSIS — I739 Peripheral vascular disease, unspecified: Secondary | ICD-10-CM | POA: Diagnosis present

## 2020-03-09 DIAGNOSIS — I6523 Occlusion and stenosis of bilateral carotid arteries: Secondary | ICD-10-CM

## 2020-03-09 DIAGNOSIS — I2510 Atherosclerotic heart disease of native coronary artery without angina pectoris: Secondary | ICD-10-CM

## 2020-03-09 NOTE — Progress Notes (Signed)
[facility] [place] HISTORY AND PHYSICAL   History of Present Illness:  Patient is a 54 y.o. year old female who presents for evaluation of PAD and carotid stenosis.    She previously underwent right common iliac and external iliac artery stenting by Dr. Madilyn Fireman in 2008.  She has also previously had a femoral-femoral bypass which has been chronically occluded.  She primarily has complaints of numbness and tingling in both lower extremities.   She has developed a walking program and is now able to walk a 1/2 to 1 mile at a time.  She does have mild symptoms of claudication when walking up inclines or stairs.  She denise non healing wounds and rest pain.  She also has a history of carotid occlusive disease and has been getting intermittent surveillance carotid duplex scans.  She denies any symptoms of TIA amaurosis or stroke.  She continues to take duel antiplatelet ASA and Plavix.    She continues to smoke on a daily basis.      Past Medical History:  Diagnosis Date  . Anxiety   . 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 Coronary atherosclerosis of native coronary artery    Multivessel  . Hyperlipidemia   . NSTEMI (non-ST elevated myocardial infarction) (HCC) 11/07   PTCA of distal circumflex 2007, residual 50% LAD and 80% PDA  . Peripheral arterial disease (HCC)    Right common and external iliac stent, right to left fem-fem bypass    Past Surgical History:  Procedure Laterality Date  . CARDIAC CATHETERIZATION  02/15/2016  . CARDIAC CATHETERIZATION N/A 02/15/2016   Procedure: Left Heart Cath and Coronary Angiography;  Surgeon: Lyn Records, MD;  Location: [facility] INVASIVE CV LAB;  Service: Cardiovascular;  Laterality: N/A;  . CORONARY ARTERY BYPASS GRAFT N/A 02/20/2016   Procedure: CORONARY ARTERY BYPASS GRAFTING (CABG)x three,  using left internal mammary artery and right greater saphenous leg vein using endoscope.;   Surgeon: Kerin Perna, MD;  Location: [facility] OR;  Service: Open Heart Surgery;  Laterality: N/A;  . FEMORAL BYPASS  7/08   Right to left; followed by right external iliac artery stent placed 12/08  . PR VEIN BYPASS GRAFT,AORTO-FEM-POP    . SP [place] TRANSCATHETER OCCLUSION     Occlusion off her fem-fem bypass with claudication in both legs  . TEE WITHOUT CARDIOVERSION N/A 02/20/2016   Procedure: TRANSESOPHAGEAL ECHOCARDIOGRAM (TEE);  Surgeon: Kerin Perna, MD;  Location: [facility] OR;  Service: Open Heart Surgery;  Laterality: N/A;    ROS:   General:  No weight loss, Fever, chills  HEENT: No recent headaches, no nasal bleeding, no visual changes, no sore throat  Neurologic: No dizziness, blackouts, seizures. No recent symptoms of stroke or mini- stroke. No recent episodes of slurred speech, or temporary blindness.  Cardiac: No recent episodes of chest pain/pressure, no shortness of breath at rest.  No shortness of breath with exertion.  Denies history of atrial fibrillation or irregular heartbeat  Vascular: No history of rest pain in feet.  No history of claudication.  No history of non-healing ulcer, No history of DVT   Pulmonary: No home oxygen, no productive cough, no hemoptysis,  No asthma or wheezing  Musculoskeletal:  [ ]  Arthritis, [ ]  Low back pain,  [ ]  Joint pain  Hematologic:No history of hypercoagulable state.  No history of easy bleeding.  No history of anemia  Gastrointestinal: No hematochezia or melena,  No  gastroesophageal reflux, no trouble swallowing  Urinary: [ ]  chronic Kidney disease, [ ]  on HD - [ ]  MWF or [ ]  TTHS, [ ]  Burning with urination, [ ]  Frequent urination, [ ]  Difficulty urinating;   Skin: No rashes  Psychological: No history of anxiety,  No history of depression  Social History Social History   Tobacco Use  . Smoking status: Current Every Day Smoker    Packs/day: 0.25    Years: 30.00    Pack years: 7.50    Types: Cigarettes    Start date: 01/27/1975   . Smokeless tobacco: Never Used  . Tobacco comment: 10 cigarettes per day  Vaping Use  . Vaping Use: Never used  Substance Use Topics  . Alcohol use: No  . Drug use: No    Family History Family History  Problem Relation Age of Onset  . Cancer Mother        Lung  . Heart disease Mother        Heart Disease before age 73  . Heart attack Mother 57  . Deep vein thrombosis Father   . Arthritis Father        Back  . Varicose Veins Father   . Cancer Sister        Breast  . Diabetes Neg Hx   . Hypertension Neg Hx   . Coronary artery disease Neg Hx     Allergies  Allergies  Allergen Reactions  . Aleve [Naproxen] Palpitations  . Crestor [Rosuvastatin] Other (See Comments)    Joint & muscle aches and pain  . Lipitor [Atorvastatin] Other (See Comments)    Joint & muscle aches and pain  . Pravachol [Pravastatin] Other (See Comments)    Joint & muscle aches and pain  . Zocor [Simvastatin] Other (See Comments)    Joint & muscle aches and pain  . Clopidogrel Nausea Only and Other (See Comments)    headache  . Zetia [Ezetimibe] Other (See Comments)    Muscle, joint pain  . Hydrocodone-Acetaminophen Nausea And Vomiting  . Oxycodone-Acetaminophen Nausea And Vomiting     Current Outpatient Medications  Medication Sig Dispense Refill  . acetaminophen (TYLENOL) 500 MG tablet Take 500 mg by mouth every 6 (six) hours as needed.    aspirin EC 81 MG tablet Take 81 mg by mouth daily.     . metoprolol tartrate (LOPRESSOR) 25 MG tablet Take 0.5 tablets (12.5 mg total) by mouth 2 (two) times daily. 90 tablet 2  . PLAVIX 75 MG tablet TAKE 1 TABLET (75 MG TOTAL) BY MOUTH DAILY. 90 tablet 2  . diphenhydrAMINE (BENADRYL) 25 MG tablet Take 25 mg by mouth every 6 (six) hours as needed. (Patient not taking: Reported on 03/09/2020)    . nitroGLYCERIN (NITROSTAT) 0.4 MG SL tablet Place 1 tablet (0.4 mg total) under the tongue every 5 (five) minutes x 3 doses as needed for chest pain (if no  relief after 3rd dose, proceed to the ED for an evaluation or call 911). (Patient not taking: Reported on 03/09/2020) 25 tablet 3   No current facility-administered medications for this visit.    Physical Examination  Vitals:   03/09/20 1125 03/09/20 1130  BP: (!) 164/75 (!) 152/76  Pulse: (!) 54 (!) 54  Resp: 14   Temp: 98.5 F (36.9 C)   TempSrc: Temporal   SpO2: 98%   Weight: 145 lb (65.8 kg)   Height: 5\' 3"  (1.6 m)     Body mass index is  25.69 kg/m.  General:  Alert and oriented, no acute distress HEENT: Normal Neck: No bruit or JVD Pulmonary: Clear to auscultation bilaterally Cardiac: Regular Rate and Rhythm without murmur Abdomen: Soft, non-tender, non-distended, no mass, no scars Skin: No rash Extremity Pulses:  2+ radial, brachial, femoral, feet are warm without palpable dorsalis pedis, posterior tibial pulses bilaterally Musculoskeletal: No deformity or edema  Neurologic: Upper and lower extremity motor 5/5 and symmetric  DATA:   ABI Findings:  +---------+------------------+-----+----------+--------+  Right  Rt Pressure (mmHg)IndexWaveform Comment   +---------+------------------+-----+----------+--------+  Brachial 167                      +---------+------------------+-----+----------+--------+  PTA   90        0.54 monophasic      +---------+------------------+-----+----------+--------+  DP    95        0.57 monophasic      +---------+------------------+-----+----------+--------+  Great Toe73        0.44 Abnormal       +---------+------------------+-----+----------+--------+   +---------+------------------+-----+----------+-------+  Left   Lt Pressure (mmHg)IndexWaveform Comment  +---------+------------------+-----+----------+-------+  Brachial 166                      +---------+------------------+-----+----------+-------+  PTA    113        0.68 monophasicbrisk   +---------+------------------+-----+----------+-------+  DP    109        0.65 monophasicbrisk   +---------+------------------+-----+----------+-------+  Great Toe85        0.51 Abnormal       +---------+------------------+-----+----------+-------+   +-------+-----------+-----------+------------+------------+  ABI/TBIToday's ABIToday's TBIPrevious ABIPrevious TBI  +-------+-----------+-----------+------------+------------+  Right 0.57    0.44    0.85    0.50      +-------+-----------+-----------+------------+------------+  Left  0.68    0.51    0.61    0.37      +-------+-----------+-----------+------------+------------+       Right ABI appears decreased. Left ABI appears essentially unchanged.    Summary:  Right: Resting right ankle-brachial index indicates moderate right lower  extremity arterial disease. The right toe-brachial index is normal.   Left: Resting left ankle-brachial index indicates moderate left lower  extremity arterial disease. The left toe-brachial index is normal.   ASSESSMENT:  PAD B LE with symptoms of claudication without progressive symptoms of ischemia.    Right ABI appears decreased. Left ABI appears essentially unchanged.  Asymptomatic carotid stenosis B.  B ICA < 39% stenosis on Duplex today.    PLAN:  She will consider cutting down on her smoking.  Continue walking program daily.  We reviewed signs and symptoms of stroke.  If she has any she will call 911.  She will call us if she develops rest pain or a non healing wound on either LE.   F/U in 1 year for repeat ABI and carotid duplex.     Mosetta Pigeon PA-C [facility] [place] Office: [phone]  MD in office [place]

## 2020-05-03 ENCOUNTER — Ambulatory Visit: Payer: Medicare Other | Admitting: Internal Medicine

## 2020-05-24 ENCOUNTER — Ambulatory Visit: Payer: Medicare Other | Admitting: Internal Medicine

## 2020-08-10 ENCOUNTER — Ambulatory Visit: Payer: Medicare Other | Admitting: Internal Medicine

## 2020-08-13 ENCOUNTER — Telehealth: Payer: Self-pay

## 2020-08-13 NOTE — Telephone Encounter (Signed)
PA Approval for Plavix 75 mg tablets approved through Cover My Meds

## 2020-09-13 NOTE — Progress Notes (Signed)
Cardiology Office Note  Date: 09/14/2020   ID: Kelsey Hansen, DOB 1965/11/13, MRN [id number]  PCP:  Patient, No Pcp Per (Inactive)  Cardiologist:  Nona Dell, MD Electrophysiologist:  None   Chief Complaint: Cardiac follow up  History of Present Illness: Kelsey Hansen is a 55 y.o. female with a history of  CAD/ NSTEMI, HLD, PAD.  Last seen by Dr. Diona Hansen 01/06/2020 for routine visit.  She stated she had used a single nitroglycerin for occasional chest pain less than once per month.  Her Lopressor and Plavix were refilled.  EKG demonstrated sinus bradycardia with LAE, possible old anteroseptal infarction similar to previous tracing.  Intolerant to statins, Zetia, Praluent.  Had not had assessment at lipid clinic at that time.  Fasting lipid profile was ordered.  She is here for 72-month follow-up today.  She denies any recent anginal or exertional symptoms.  She states the last time she used sublingual nitroglycerin was in September of last year.  She has had no other anginal symptoms since that time.  She denies any significant DOE S OB.  She continues to smoke.  She has an upcoming appointment with the lipid clinic on May 16.  She denies any CVA or TIA-like symptoms, orthostatic symptoms, PND, orthopnea, bleeding, palpitations or arrhythmias.  Denies any claudication-like symptoms, DVT or PE-like symptoms, or lower extremity edema.  She is compliant with her medications.  History of right common and external iliac stent and right to left femorofemoral bypass.  Past Medical History:  Diagnosis Date  . Anxiety   . 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 Coronary atherosclerosis of native coronary artery    Multivessel  . Hyperlipidemia   . NSTEMI (non-ST elevated myocardial infarction) (HCC) 11/07   PTCA of distal circumflex 2007, residual 50% LAD and 80% PDA  . Peripheral arterial disease (HCC)    Right common and external  iliac stent, right to left fem-fem bypass    Past Surgical History:  Procedure Laterality Date  . CARDIAC CATHETERIZATION  02/15/2016  . CARDIAC CATHETERIZATION N/A 02/15/2016   Procedure: Left Heart Cath and Coronary Angiography;  Surgeon: Lyn Records, MD;  Location: [facility] INVASIVE CV LAB;  Service: Cardiovascular;  Laterality: N/A;  . CORONARY ARTERY BYPASS GRAFT N/A 02/20/2016   Procedure: CORONARY ARTERY BYPASS GRAFTING (CABG)x three,  using left internal mammary artery and right greater saphenous leg vein using endoscope.;  Surgeon: Kerin Perna, MD;  Location: [facility] OR;  Service: Open Heart Surgery;  Laterality: N/A;  . FEMORAL BYPASS  7/08   Right to left; followed by right external iliac artery stent placed 12/08  . PR VEIN BYPASS GRAFT,AORTO-FEM-POP    . SP [place] TRANSCATHETER OCCLUSION     Occlusion off her fem-fem bypass with claudication in both legs  . TEE WITHOUT CARDIOVERSION N/A 02/20/2016   Procedure: TRANSESOPHAGEAL ECHOCARDIOGRAM (TEE);  Surgeon: Kerin Perna, MD;  Location: [facility] OR;  Service: Open Heart Surgery;  Laterality: N/A;    Current Outpatient Medications  Medication Sig Dispense Refill  . acetaminophen (TYLENOL) 500 MG tablet Take 500 mg by mouth every 6 (six) hours as needed.    Marland Kitchen aspirin EC 81 MG tablet Take 81 mg by mouth daily.     . diphenhydrAMINE (BENADRYL) 25 MG tablet Take 25 mg by mouth every 6 (six) hours as needed.    . metoprolol tartrate (LOPRESSOR) 25 MG tablet Take 0.5 tablets (12.5 mg total)  by mouth 2 (two) times daily. 90 tablet 2  . nitroGLYCERIN (NITROSTAT) 0.4 MG SL tablet Place 1 tablet (0.4 mg total) under the tongue every 5 (five) minutes x 3 doses as needed for chest pain (if no relief after 3rd dose, proceed to the ED for an evaluation or call 911). 25 tablet 3  . PLAVIX 75 MG tablet TAKE 1 TABLET (75 MG TOTAL) BY MOUTH DAILY. 90 tablet 2   No current facility-administered medications for this visit.   Allergies:  Aleve [naproxen], Crestor  [rosuvastatin], Lipitor [atorvastatin], Pravachol [pravastatin], Zocor [simvastatin], Clopidogrel, Zetia [ezetimibe], Hydrocodone-acetaminophen, and Oxycodone-acetaminophen   Social History: The patient  reports that she has been smoking cigarettes. She started smoking about 45 years ago. She has a 7.50 pack-year smoking history. She has never used smokeless tobacco. She reports that she does not drink alcohol and does not use drugs.   Family History: The patient's family history includes Arthritis in her father; Cancer in her mother and sister; Deep vein thrombosis in her father; Heart attack (age of onset: 71) in her mother; Heart disease in her mother; Varicose Veins in her father.   ROS:  Please see the history of present illness. Otherwise, complete review of systems is positive for none.  All other systems are reviewed and negative.   Physical Exam: VS:  BP 138/78   Pulse 61   Ht 5\' 2"  (1.575 m)   Wt 146 lb (66.2 kg)   SpO2 98%   BMI 26.70 kg/m , BMI Body mass index is 26.7 kg/m.  Wt Readings from Last 3 Encounters:  09/14/20 146 lb (66.2 kg)  03/09/20 145 lb (65.8 kg)  01/06/20 148 lb 9.6 oz (67.4 kg)    General: Patient appears comfortable at rest. Neck: Supple, no elevated JVP or carotid bruits, no thyromegaly. Lungs: Clear to auscultation, nonlabored breathing at rest. Cardiac: Regular rate and rhythm, no S3 or significant systolic murmur, no pericardial rub. Extremities: No pitting edema, distal pulses 2+. Skin: Warm and dry. Musculoskeletal: No kyphosis. Neuropsychiatric: Alert and oriented x3, affect grossly appropriate.  ECG:  EKG January 06, 2020 sinus bradycardia with a rate of 55, possible left atrial enlargement, septal infarct, age undetermined.  Recent Labwork: 01/13/2020: BUN 12; Creatinine, Ser 0.62; Potassium 3.7; Sodium 137     Component Value Date/Time   CHOL 242 (H) 01/13/2020 0833   TRIG 181 (H) 01/13/2020 0833   HDL 39 (L) 01/13/2020 0833   CHOLHDL 6.2  01/13/2020 0833   VLDL 36 01/13/2020 0833   LDLCALC 167 (H) 01/13/2020 0833   LDLDIRECT 96.7 10/19/2006 1506    Other Studies Reviewed Today:  Cardiac catheterization 02/15/2016:  Prox RCA-2 lesion, 75 %stenosed.  Prox RCA-1 lesion, 90 %stenosed.  Mid RCA-2 lesion, 85 %stenosed.  Mid RCA-1 lesion, 80 %stenosed.  Ost Ramus to Ramus lesion, 65 %stenosed.  Prox Cx to Mid Cx lesion, 75 %stenosed.  1st Diag lesion, 50 %stenosed.  Ost LAD lesion, 90 %stenosed.  The left ventricular ejection fraction is 55-65% by visual estimate.  The left ventricular systolic function is normal.  LV end diastolic pressure is normal.   Severe 2 vessel coronary disease involving the right coronary diffusely (relatively small vessel), and significant proximal LAD involvement. The circumflex contains moderate coronary disease including the site of previous angioplasty in the mid vessel at the time of infarction in 2007. Ramus has intermediate stenosis.  Normal left ventricular systolic function. LVEF at least 60% with normal EDP.  Severe peripheral vascular  disease with right iliac stents and left femoral-femoral bypass. Prior right brachial occlusion at the time of catheterization 2007 requiring thrombectomy by Dr. Bud Face.  Echocardiogram 02/16/2016: Study Conclusions  - Left ventricle: The cavity size was normal. Wall thickness was increased in a pattern of mild LVH. Systolic function was normal. The estimated ejection fraction was in the range of 60% to 65%. Wall motion was normal; there were no regional wall motion abnormalities. Left ventricular diastolic function parameters were normal. - Aortic valve: Trileaflet. Sclerosis without stenosis. There was mild regurgitation. - Left atrium: The atrium was normal in size. - Inferior vena cava: The vessel was normal in size. The respirophasic diameter changes were in the normal range (>= 50%), consistent with normal  central venous pressure.  Impressions:  - LVEF 60-65%, mild LVH, normal wall motion, normal diastolic function, aortic valve sclerosis with mild AI, normal LA size, normal IVC.  Lower extremity ABIs 03/05/2018: Final Interpretation: Right: Resting right ankle-brachial index indicates mild right lower extremity arterial disease. The right toe-brachial index is abnormal. RT great toe pressure = 62 mmHg. PPG tracings appear dampened.  Left: Resting left ankle-brachial index indicates moderate left lower extremity arterial disease. The left toe-brachial index is abnormal. LT Great toe pressure = 46 mmHg. PPG tracings appear dampened. Assessment and Plan:  1. CAD in native artery   2. Mixed hyperlipidemia   3. Tobacco abuse    1. CAD in native artery Denies any anginal or exertional symptoms.  Continue aspirin 81 mg daily, Plavix 75 mg daily, nitroglycerin sublingual as needed.  Continue metoprolol 12.5 mg p.o. twice daily.  Please refill metoprolol and Plavix  2. Mixed hyperlipidemia Last lipid panel 01/13/2020: TC 242, HDL 39, LDL 167, triglycerides 181.  She has a pending appointment with Dr. Rennis Golden at lipid [place] echo on May 16 at 9 AM.  3. Tobacco abuse Continues to smoke.  Highly advised cessation.   4.  PAD/PVD Denies any claudication-like symptoms.  Last ABIs 2019 indicated right mild lower extremity arterial disease.  And moderate left lower extremity disease.   Medication Adjustments/Labs and Tests Ordered: Current medicines are reviewed at length with the patient today.  Concerns regarding medicines are outlined above.   Disposition: Follow-up with Dr. Diona Hansen or APP 6 months  Signed, Rennis Harding, NP 09/14/2020 9:38 AM    [facility] [address] [place], [place], [place] [address] Phone: [phone]; Fax: [phone]

## 2020-09-14 ENCOUNTER — Encounter: Payer: Self-pay | Admitting: Family Medicine

## 2020-09-14 ENCOUNTER — Ambulatory Visit (INDEPENDENT_AMBULATORY_CARE_PROVIDER_SITE_OTHER): Payer: Medicare Other | Admitting: Family Medicine

## 2020-09-14 VITALS — BP 138/78 | HR 61 | Ht 62.0 in | Wt 146.0 lb

## 2020-09-14 DIAGNOSIS — I2510 Atherosclerotic heart disease of native coronary artery without angina pectoris: Secondary | ICD-10-CM

## 2020-09-14 DIAGNOSIS — E782 Mixed hyperlipidemia: Secondary | ICD-10-CM

## 2020-09-14 DIAGNOSIS — I739 Peripheral vascular disease, unspecified: Secondary | ICD-10-CM | POA: Diagnosis not present

## 2020-09-14 DIAGNOSIS — Z720 Tobacco use: Secondary | ICD-10-CM | POA: Diagnosis not present

## 2020-09-14 MED ORDER — METOPROLOL TARTRATE 25 MG PO TABS: 12.5000 mg | ORAL_TABLET | Freq: Two times a day (BID) | ORAL | 1 refills | Status: DC

## 2020-09-14 MED ORDER — PLAVIX 75 MG PO TABS: ORAL_TABLET | ORAL | 1 refills | Status: DC

## 2020-09-14 NOTE — Patient Instructions (Signed)
Your physician recommends that you schedule a follow-up appointment in: 6 MONTHS WITH DR MCDOWELL  Your physician recommends that you continue on your current medications as directed. Please refer to the Current Medication list given to you today.  Thank you for choosing [facility]!!    

## 2020-10-29 ENCOUNTER — Ambulatory Visit: Payer: Medicare Other | Admitting: Internal Medicine

## 2020-11-16 ENCOUNTER — Other Ambulatory Visit: Payer: Self-pay | Admitting: Cardiology

## 2020-11-17 ENCOUNTER — Other Ambulatory Visit: Payer: Self-pay

## 2020-11-17 DIAGNOSIS — I779 Disorder of arteries and arterioles, unspecified: Secondary | ICD-10-CM

## 2020-11-17 DIAGNOSIS — I6523 Occlusion and stenosis of bilateral carotid arteries: Secondary | ICD-10-CM

## 2020-12-11 ENCOUNTER — Other Ambulatory Visit: Payer: Self-pay

## 2020-12-11 ENCOUNTER — Telehealth: Payer: Self-pay

## 2020-12-11 DIAGNOSIS — I779 Disorder of arteries and arterioles, unspecified: Secondary | ICD-10-CM

## 2020-12-11 DIAGNOSIS — I6523 Occlusion and stenosis of bilateral carotid arteries: Secondary | ICD-10-CM

## 2020-12-11 DIAGNOSIS — I739 Peripheral vascular disease, unspecified: Secondary | ICD-10-CM

## 2020-12-11 NOTE — Telephone Encounter (Signed)
Patient calls today to report bilateral leg swelling that has been occurring for about a week. It is painful and she states "my skin on my foot is so tight I feel like I could take a needle and pop it." Denies color and temperature change. Her LLE is worse than her right. She is due for a bil carotid and ABI follow up in September, moved up these follow up appts and added a venous reflux study.

## 2021-01-11 ENCOUNTER — Ambulatory Visit (HOSPITAL_COMMUNITY)
Admission: RE | Admit: 2021-01-11 | Discharge: 2021-01-11 | Disposition: A | Discharge status: Home / Self Care | Payer: Medicare Other | Source: Ambulatory Visit | Attending: Vascular Surgery | Admitting: Vascular Surgery

## 2021-01-11 ENCOUNTER — Ambulatory Visit (INDEPENDENT_AMBULATORY_CARE_PROVIDER_SITE_OTHER)
Admission: RE | Admit: 2021-01-11 | Discharge: 2021-01-11 | Disposition: A | Discharge status: Home / Self Care | Payer: Medicare Other | Source: Ambulatory Visit | Attending: Vascular Surgery | Admitting: Vascular Surgery

## 2021-01-11 ENCOUNTER — Ambulatory Visit (INDEPENDENT_AMBULATORY_CARE_PROVIDER_SITE_OTHER): Payer: Medicare Other | Admitting: Physician Assistant

## 2021-01-11 ENCOUNTER — Other Ambulatory Visit: Payer: Self-pay

## 2021-01-11 VITALS — BP 155/60 | HR 54 | Temp 97.9°F | Resp 20 | Ht 62.0 in | Wt 131.3 lb

## 2021-01-11 DIAGNOSIS — I739 Peripheral vascular disease, unspecified: Secondary | ICD-10-CM | POA: Diagnosis present

## 2021-01-11 DIAGNOSIS — I779 Disorder of arteries and arterioles, unspecified: Secondary | ICD-10-CM | POA: Insufficient documentation

## 2021-01-11 DIAGNOSIS — I6523 Occlusion and stenosis of bilateral carotid arteries: Secondary | ICD-10-CM | POA: Insufficient documentation

## 2021-01-11 DIAGNOSIS — Z87410 Personal history of cervical dysplasia: Secondary | ICD-10-CM | POA: Insufficient documentation

## 2021-01-11 NOTE — Progress Notes (Signed)
[facility] [place] HISTORY AND PHYSICAL   History of Present Illness:  Patient is a 55 y.o. year old female who presents for evaluation of PAD with new on set of B LE edema.  Left leg symptoms worse than right.  She previously underwent right common iliac and external iliac artery stenting by Dr. Madilyn Fireman in 2008.  She has also previously had a femoral-femoral bypass which has been chronically occluded.  She also has a history of carotid occlusive disease and has been getting intermittent surveillance carotid duplex scans.  She denies any symptoms of TIA amaurosis or stroke.  She continues to take duel antiplatelet ASA and Plavix.  She called with a chief complaint of B LE edema and pain.  She is here today for repeat ABI, Carotid duplex and Venous duplex.    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, required surgical repair   Coronary atherosclerosis of native coronary artery    Multivessel   Hyperlipidemia    NSTEMI (non-ST elevated myocardial infarction) (HCC) 11/07   PTCA of distal circumflex 2007, residual 50% LAD and 80% PDA   Peripheral arterial disease (HCC)    Right common and external iliac stent, right to left fem-fem bypass    Past Surgical History:  Procedure Laterality Date   CARDIAC CATHETERIZATION  02/15/2016   CARDIAC CATHETERIZATION N/A 02/15/2016   Procedure: Left Heart Cath and Coronary Angiography;  Surgeon: Lyn Records, MD;  Location: [facility] INVASIVE CV LAB;  Service: Cardiovascular;  Laterality: N/A;   CORONARY ARTERY BYPASS GRAFT N/A 02/20/2016   Procedure: CORONARY ARTERY BYPASS GRAFTING (CABG)x three,  using left internal mammary artery and right greater saphenous leg vein using endoscope.;  Surgeon: Kerin Perna, MD;  [facility];  Service: Open Heart Surgery;  Laterality: N/A;   FEMORAL BYPASS  7/08   Right to left; followed by right external iliac artery stent  placed 12/08   PR VEIN BYPASS GRAFT,AORTO-FEM-POP     [place] TRANSCATHETER OCCLUSION     Occlusion off her fem-fem bypass with claudication in both legs   TEE WITHOUT CARDIOVERSION N/A 02/20/2016   Procedure: TRANSESOPHAGEAL ECHOCARDIOGRAM (TEE);  Surgeon: Kerin Perna, MD;  Location: [facility] OR;  Service: Open Heart Surgery;  Laterality: N/A;    ROS:   General:  No weight loss, Fever, chills  HEENT: No recent headaches, no nasal bleeding, no visual changes, no sore throat  Neurologic: No dizziness, blackouts, seizures. No recent symptoms of stroke or mini- stroke. No recent episodes of slurred speech, or temporary blindness.  Cardiac: No recent episodes of chest pain/pressure, no shortness of breath at rest.  No shortness of breath with exertion.  Denies history of atrial fibrillation or irregular heartbeat  Vascular: No history of rest pain in feet.  No history of claudication.  No history of non-healing ulcer, No history of DVT   Pulmonary: No home oxygen, no productive cough, no hemoptysis,  No asthma or wheezing  Musculoskeletal:  [ ]  Arthritis, [ ]  Low back pain,  [ ]  Joint pain  Hematologic:No history of hypercoagulable state.  No history of easy bleeding.  No history of anemia  Gastrointestinal: No hematochezia or melena,  No gastroesophageal reflux, no trouble swallowing  Urinary: [ ]  chronic Kidney disease, [ ]  on HD - [ ]  MWF or [ ]  TTHS, [ ]  Burning with urination, [ ]  Frequent urination, [ ]   Difficulty urinating;   Skin: No rashes  Psychological: No history of anxiety,  No history of depression  Social History Social History   Tobacco Use   Smoking status: Every Day    Packs/day: 0.25    Years: 30.00    Pack years: 7.50    Types: Cigarettes    Start date: 01/27/1975   Smokeless tobacco: Never   Tobacco comments:    10 cigarettes per day  Vaping Use   Vaping Use: Never used  Substance Use Topics   Alcohol use: No   Drug use: No    Family History Family  History  Problem Relation Age of Onset   Cancer Mother        Lung   Heart disease Mother        Heart Disease before age 38   Heart attack Mother 30   Deep vein thrombosis Father    Arthritis Father        Back   Varicose Veins Father    Cancer Sister        Breast   Diabetes Neg Hx    Hypertension Neg Hx    Coronary artery disease Neg Hx     Allergies  Allergies  Allergen Reactions   Aleve [Naproxen] Palpitations   Crestor [Rosuvastatin] Other (See Comments)    Joint & muscle aches and pain   Lipitor [Atorvastatin] Other (See Comments)    Joint & muscle aches and pain   Pravachol [Pravastatin] Other (See Comments)    Joint & muscle aches and pain   Zocor [Simvastatin] Other (See Comments)    Joint & muscle aches and pain   Clopidogrel Nausea Only and Other (See Comments)    headache   Zetia [Ezetimibe] Other (See Comments)    Muscle, joint pain   Hydrocodone-Acetaminophen Nausea And Vomiting   Oxycodone-Acetaminophen Nausea And Vomiting     Current Outpatient Medications  Medication Sig Dispense Refill   acetaminophen (TYLENOL) 500 MG tablet Take 500 mg by mouth every 6 (six) hours as needed.     aspirin EC 81 MG tablet Take 81 mg by mouth daily.      diphenhydrAMINE (BENADRYL) 25 MG tablet Take 25 mg by mouth every 6 (six) hours as needed.     metoprolol tartrate (LOPRESSOR) 25 MG tablet Take 0.5 tablets (12.5 mg total) by mouth 2 (two) times daily. 90 tablet 1   nitroGLYCERIN (NITROSTAT) 0.4 MG SL tablet PLACE 1 TABLET (0.4 MG TOTAL) UNDER THE TONGUE EVERY 5 (FIVE) MINUTES AS NEEDED FOR CHEST PAIN. 25 tablet 3   PLAVIX 75 MG tablet TAKE 1 TABLET (75 MG TOTAL) BY MOUTH DAILY. 90 tablet 1   No current facility-administered medications for this visit.    Physical Examination  Vitals:   01/11/21 0951 01/11/21 0958  BP: (!) 166/69 (!) 155/60  Pulse: (!) 54   Resp: 20   Temp: 97.9 F (36.6 C)   TempSrc: Temporal   SpO2: 100%   Weight: 131 lb 4.8 oz (59.6  kg)   Height:  (1.575 m)     Body mass index is 24.02 kg/m.  General:  Alert and oriented, no acute distress HEENT: Normal Neck: No bruit or JVD Pulmonary: Clear to auscultation bilaterally Cardiac: Regular Rate and Rhythm without murmur Abdomen: Soft, non-tender, non-distended, no mass, no scars Skin: No rash Extremity Pulses:  2+ radial, brachial, femoral, non palpable dorsalis pedis, posterior tibial pulses bilaterally Musculoskeletal: No deformity or edema  Neurologic: Upper and  lower extremity motor 5/5 and symmetric  DATA:  ABI Findings:  +---------+------------------+-----+----------+--------+  Right    Rt Pressure (mmHg)IndexWaveform  Comment   +---------+------------------+-----+----------+--------+  Brachial 161                                        +---------+------------------+-----+----------+--------+  PTA      88                0.53 monophasic          +---------+------------------+-----+----------+--------+  DP       74                0.44 monophasic          +---------+------------------+-----+----------+--------+  Great Toe40                0.24 Abnormal            +---------+------------------+-----+----------+--------+   +---------+------------------+-----+----------+-------+  Left     Lt Pressure (mmHg)IndexWaveform  Comment  +---------+------------------+-----+----------+-------+  Brachial 167                                       +---------+------------------+-----+----------+-------+  PTA      106               0.63 monophasic         +---------+------------------+-----+----------+-------+  DP       79                0.47 monophasic         +---------+------------------+-----+----------+-------+  Great Toe74                0.44 Abnormal           +---------+------------------+-----+----------+-------+   +-------+-----------+-----------+------------+------------+  ABI/TBIToday's  ABIToday's TBIPrevious ABIPrevious TBI  +-------+-----------+-----------+------------+------------+  Right  0.53       .24        0.57        0.44          +-------+-----------+-----------+------------+------------+  Left   0.63       0.44       0.68        0.81          +-------+-----------+-----------+------------+------------+    Bilateral ABIs appear essentially unchanged. Bilateral TBIs appear  decreased.     Summary:  Right: Resting right ankle-brachial index indicates moderate right lower  extremity arterial disease. The right toe-brachial index is abnormal.   Left: Resting left ankle-brachial index indicates moderate left lower  extremity arterial disease. The left toe-brachial index is abnormal.    Right Carotid Findings:  +----------+--------+--------+--------+------------------+--------+            PSV cm/sEDV cm/sStenosisPlaque DescriptionComments  +----------+--------+--------+--------+------------------+--------+  CCA Prox  123     13                                          +----------+--------+--------+--------+------------------+--------+  CCA Mid   103     17                                          +----------+--------+--------+--------+------------------+--------+  CCA Distal69      17                                          +----------+--------+--------+--------+------------------+--------+  ICA Prox  95      27      1-39%                               +----------+--------+--------+--------+------------------+--------+  ICA Mid   127     37                                          +----------+--------+--------+--------+------------------+--------+  ICA Distal173     30                                          +----------+--------+--------+--------+------------------+--------+  ECA       132     27                                           +----------+--------+--------+--------+------------------+--------+   +----------+--------+-------+----------------+-------------------+            PSV cm/sEDV cmsDescribe        Arm Pressure (mmHG)  +----------+--------+-------+----------------+-------------------+  Subclavian107     2      Multiphasic, WNL                     +----------+--------+-------+----------------+-------------------+   +---------+--------+--+--------+--+---------+  VertebralPSV cm/s82EDV cm/s23Antegrade  +---------+--------+--+--------+--+---------+       Left Carotid Findings:  +----------+--------+--------+--------+------------------+--------+            PSV cm/sEDV cm/sStenosisPlaque DescriptionComments  +----------+--------+--------+--------+------------------+--------+  CCA Prox  106     16                                          +----------+--------+--------+--------+------------------+--------+  CCA Mid   92      19                                          +----------+--------+--------+--------+------------------+--------+  CCA Distal61      19              heterogenous                +----------+--------+--------+--------+------------------+--------+  ICA Prox  76      29      1-39%   heterogenous                +----------+--------+--------+--------+------------------+--------+  ICA Mid   145     41      40-59%                    curve     +----------+--------+--------+--------+------------------+--------+  ICA Distal212     34                                          +----------+--------+--------+--------+------------------+--------+  ECA       109     12              heterogenous                +----------+--------+--------+--------+------------------+--------+   +----------+--------+--------+----------------+-------------------+            PSV cm/sEDV cm/sDescribe        Arm Pressure (mmHG)   +----------+--------+--------+----------------+-------------------+  Subclavian119     6       Multiphasic, WNL                     +----------+--------+--------+----------------+-------------------+   +---------+--------+--+--------+--+---------+  VertebralPSV cm/s91EDV cm/s25Antegrade  +---------+--------+--+--------+--+---------+      Venous Reflux Times  +--------------+---------+------+-----------+------------+--------+  RIGHT         Reflux NoRefluxReflux TimeDiameter cmsComments                          Yes                                   +--------------+---------+------+-----------+------------+--------+  CFV                     yes   >1 second                       +--------------+---------+------+-----------+------------+--------+  FV mid        no                                              +--------------+---------+------+-----------+------------+--------+  Popliteal     no                                              +--------------+---------+------+-----------+------------+--------+  GSV at SFJ              yes    >500 ms      0.52              +--------------+---------+------+-----------+------------+--------+  GSV prox thigh                              0.38              +--------------+---------+------+-----------+------------+--------+  GSV mid thigh                                       NV        +--------------+---------+------+-----------+------------+--------+  GSV dist thigh                                      NV        +--------------+---------+------+-----------+------------+--------+  GSV at knee                                         NV        +--------------+---------+------+-----------+------------+--------+  GSV prox calf no                            0.21              +--------------+---------+------+-----------+------------+--------+  GSV mid calf  no                             0.18              +--------------+---------+------+-----------+------------+--------+  SSV Pop Fossa no                            0.14              +--------------+---------+------+-----------+------------+--------+  SSV prox calf no                            0.18              +--------------+---------+------+-----------+------------+--------+  SSV mid calf  no                            0.14              +--------------+---------+------+-----------+------------+--------+  AASV          no                            0.21              +--------------+---------+------+-----------+------------+--------+      +--------------+---------+------+-----------+------------+--------+  LEFT          Reflux NoRefluxReflux TimeDiameter cmsComments                          Yes                                   +--------------+---------+------+-----------+------------+--------+  CFV           no                                              +--------------+---------+------+-----------+------------+--------+  FV mid        no                                              +--------------+---------+------+-----------+------------+--------+  Popliteal     no                                              +--------------+---------+------+-----------+------------+--------+  [facility]    no                            0.52              +--------------+---------+------+-----------+------------+--------+  GSV prox thighno  0.49              +--------------+---------+------+-----------+------------+--------+  GSV mid thigh no                            0.32              +--------------+---------+------+-----------+------------+--------+  GSV dist thighno                            0.30              +--------------+---------+------+-----------+------------+--------+  GSV at  knee   no                            0.32              +--------------+---------+------+-----------+------------+--------+  GSV prox calf no                            0.26              +--------------+---------+------+-----------+------------+--------+  GSV mid calf  no                            0.20              +--------------+---------+------+-----------+------------+--------+  SSV Pop Fossa no                            0.24              +--------------+---------+------+-----------+------------+--------+  SSV prox calf no                            0.12              +--------------+---------+------+-----------+------------+--------+  SSV mid calf  no                            0.17              +--------------+---------+------+-----------+------------+--------+             ASSESSMENT:   PAD  S/P right common iliac and external iliac artery stenting by Dr. Madilyn Fireman in 2008.  She has also previously had a femoral-femoral bypass which has been chronically occluded.   Bilateral ABIs appear essentially unchanged. Bilateral TBIs appear  decreased.   Asymptomatic Carotid ICA stenosis  Left 40-59% stenosis and right < 39% stenosis  New B LE edema Right:  - No evidence of deep vein thrombosis seen in the right lower extremity,  from the common femoral through the popliteal veins.  - No evidence of superficial venous reflux seen in the right short  saphenous vein.  - Venous reflux is noted in the right common femoral vein.  - Venous reflux is noted in the right sapheno-femoral junction.   - The right greater saphenous vein harvested.     Left:  - No evidence of deep vein thrombosis seen in the left lower extremity,  from the common femoral through the popliteal veins.  - No evidence of superficial venous reflux seen in the left greater  saphenous vein.  - No evidence of superficial venous reflux seen in the left  short  saphenous  vein  PLAN: She is stable without new ischemic changes or symptoms.  Her edema has gone away.  She states she has not had swelling for over a week now.  She will f/u in 1 year for carotid duplex and ABI's.  We discussed symptoms of stroke/TIA and ischemia in the LE's.  If she has concerns she will call sooner.  I suggested continuing a walking program of up to a mile per day and to try and stop smoking.  If her edema returns she should elevate her LE's as needed.  Mosetta Pigeon PA-C [facility] [place] Office: [phone]  MD in clinic [place]

## 2021-05-07 ENCOUNTER — Other Ambulatory Visit: Payer: Self-pay | Admitting: Family Medicine

## 2021-08-06 ENCOUNTER — Other Ambulatory Visit: Payer: Self-pay | Admitting: *Deleted

## 2021-08-06 MED ORDER — ACETAMINOPHEN 500 MG PO TABS: 500.0000 mg | ORAL_TABLET | Freq: Four times a day (QID) | ORAL | 1 refills | Status: AC | PRN

## 2021-08-09 ENCOUNTER — Other Ambulatory Visit: Payer: Self-pay | Admitting: *Deleted

## 2021-08-09 MED ORDER — PLAVIX 75 MG PO TABS: ORAL_TABLET | ORAL | 0 refills | Status: DC

## 2021-11-01 ENCOUNTER — Other Ambulatory Visit: Payer: Self-pay | Admitting: Cardiology

## 2021-12-09 ENCOUNTER — Encounter: Payer: Self-pay | Admitting: *Deleted

## 2022-02-04 ENCOUNTER — Other Ambulatory Visit: Payer: Self-pay | Admitting: Cardiology

## 2022-02-13 ENCOUNTER — Encounter: Payer: Self-pay | Admitting: Cardiology

## 2022-03-03 ENCOUNTER — Other Ambulatory Visit: Payer: Self-pay | Admitting: Cardiology

## 2022-04-30 ENCOUNTER — Ambulatory Visit: Payer: Medicare Other | Admitting: Cardiology

## 2022-05-01 ENCOUNTER — Other Ambulatory Visit: Payer: Self-pay | Admitting: *Deleted

## 2022-05-01 DIAGNOSIS — I6523 Occlusion and stenosis of bilateral carotid arteries: Secondary | ICD-10-CM

## 2022-05-01 DIAGNOSIS — I779 Disorder of arteries and arterioles, unspecified: Secondary | ICD-10-CM

## 2022-05-01 DIAGNOSIS — I739 Peripheral vascular disease, unspecified: Secondary | ICD-10-CM

## 2022-05-02 ENCOUNTER — Ambulatory Visit: Payer: 59 | Attending: Cardiology | Admitting: Cardiology

## 2022-05-02 ENCOUNTER — Encounter: Payer: Self-pay | Admitting: Cardiology

## 2022-05-02 VITALS — BP 132/62 | HR 58 | Ht 62.0 in | Wt 135.0 lb

## 2022-05-02 DIAGNOSIS — E782 Mixed hyperlipidemia: Secondary | ICD-10-CM

## 2022-05-02 DIAGNOSIS — I25119 Atherosclerotic heart disease of native coronary artery with unspecified angina pectoris: Secondary | ICD-10-CM

## 2022-05-02 DIAGNOSIS — Z720 Tobacco use: Secondary | ICD-10-CM

## 2022-05-02 DIAGNOSIS — Z789 Other specified health status: Secondary | ICD-10-CM

## 2022-05-02 DIAGNOSIS — I739 Peripheral vascular disease, unspecified: Secondary | ICD-10-CM

## 2022-05-02 MED ORDER — NITROGLYCERIN 0.4 MG SL SUBL: 0.4000 mg | SUBLINGUAL_TABLET | SUBLINGUAL | 3 refills | Status: DC | PRN

## 2022-05-02 NOTE — Patient Instructions (Signed)
Medication Instructions:  Your physician recommends that you continue on your current medications as directed. Please refer to the Current Medication list given to you today.   Labwork: Fasting Lipid (2 weeks at [facility])  Testing/Procedures: none  Follow-Up:  Your physician recommends that you schedule a follow-up appointment in: 6 months  Any Other Special Instructions Will Be Listed Below (If Applicable).  You have been referred to Lipid Clinic  If you need a refill on your cardiac medications before your next appointment, please call your pharmacy.

## 2022-05-02 NOTE — Progress Notes (Signed)
Cardiology Office Note  Date: 05/02/2022   ID: Kelsey Hansen, DOB 12/13/1965, MRN [id number]  PCP:  Patient, No Pcp Per  Cardiologist:  Nona Dell, MD Electrophysiologist:  None   Chief Complaint  Patient presents with   Cardiac follow-up    History of Present Illness: Kelsey Hansen is a 56 y.o. female last seen in April 2022 by Mr. Vincenza Hews NP, I reviewed the note.  She is here for a routine visit.  Reports no progressive angina with only occasional nitroglycerin use.  Still functional with ADLs and outside chores, NYHA class II dyspnea.  She continues to smoke cigarettes, has been trying to cut back but has not been able to quit over time.  She has follow-up with [facility] later in the month.  Reports stable claudication.  I reviewed her vascular imaging studies from last year.  We went over her medications which are outlined below.  Discussed refill for nitroglycerin.  Also plan to make referral to the lipid clinic.  She has a history of multi-statin intolerance, also intolerance to Zetia and Praluent.  I personally reviewed her ECG today which shows sinus bradycardia with left atrial enlargement and septal Q waves as before.  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[facility])    Postcatheterization complication, required surgical repair   Coronary atherosclerosis of native coronary artery    Multivessel   Hyperlipidemia    NSTEMI (non-ST elevated myocardial infarction) (HCC) 11/07   PTCA of distal circumflex 2007, residual 50% LAD and 80% PDA   Peripheral arterial disease (HCC)    Right common and external iliac stent, right to left fem-fem bypass    Past Surgical History:  Procedure Laterality Date   CARDIAC CATHETERIZATION  02/15/2016   CARDIAC CATHETERIZATION N/A 02/15/2016   Procedure: Left Heart Cath and Coronary Angiography;  Surgeon: Lyn Records, MD;  Location: [facility] INVASIVE CV LAB;  Service: Cardiovascular;  Laterality: N/A;    CORONARY ARTERY BYPASS GRAFT N/A 02/20/2016   Procedure: CORONARY ARTERY BYPASS GRAFTING (CABG)x three,  using left internal mammary artery and right greater saphenous leg vein using endoscope.;  Surgeon: Kerin Perna, MD;  Location: [facility] OR;  Service: Open Heart Surgery;  Laterality: N/A;   FEMORAL BYPASS  7/08   Right to left; followed by right external iliac artery stent placed 12/08   PR VEIN BYPASS GRAFT,AORTO-FEM-POP     SP [place] TRANSCATHETER OCCLUSION     Occlusion off her fem-fem bypass with claudication in both legs   TEE WITHOUT CARDIOVERSION N/A 02/20/2016   Procedure: TRANSESOPHAGEAL ECHOCARDIOGRAM (TEE);  Surgeon: Kerin Perna, MD;  Location: [facility] OR;  Service: Open Heart Surgery;  Laterality: N/A;    Current Outpatient Medications  Medication Sig Dispense Refill   acetaminophen (TYLENOL) 500 MG tablet Take 1 tablet (500 mg total) by mouth every 6 (six) hours as needed. 90 tablet 1   aspirin EC 81 MG tablet Take 81 mg by mouth daily.      cetirizine (ZYRTEC) 10 MG tablet Take 10 mg by mouth daily.     diphenhydrAMINE (BENADRYL) 25 MG tablet Take 25 mg by mouth every 6 (six) hours as needed.     metoprolol tartrate (LOPRESSOR) 25 MG tablet TAKE 1/2 TABLET BY MOUTH TWO TIMES A DAY 90 tablet 0   PLAVIX 75 MG tablet TAKE ONE TABLET BY MOUTH EVERY DAY 30 tablet 2   nitroGLYCERIN (NITROSTAT) 0.4 MG SL tablet  Place 1 tablet (0.4 mg total) under the tongue every 5 (five) minutes x 3 doses as needed for chest pain (if no relief after 3rd dose, call 911 or go to ED.). 25 tablet 3   No current facility-administered medications for this visit.   Allergies:  Aleve [naproxen], Crestor [rosuvastatin], Lipitor [atorvastatin], Pravachol [pravastatin], Zocor [simvastatin], Clopidogrel, Zetia [ezetimibe], Hydrocodone-acetaminophen, and Oxycodone-acetaminophen   ROS: No syncope.  Physical Exam: VS:  BP 132/62 (BP Location: Right Arm, Patient Position: Sitting, Cuff Size: Normal)   Pulse (!) 58    Ht 5\' 2"  (1.575 m)   Wt 135 lb (61.2 kg)   BMI 24.69 kg/m , BMI Body mass index is 24.69 kg/m.  Wt Readings from Last 3 Encounters:  05/02/22 135 lb (61.2 kg)  01/11/21 131 lb 4.8 oz (59.6 kg)  09/14/20 146 lb (66.2 kg)    General: Patient appears comfortable at rest. HEENT: Conjunctiva and lids normal. Neck: Supple, no elevated JVP or carotid bruits. Lungs: Clear to auscultation, nonlabored breathing at rest. Cardiac: Regular rate and rhythm, no S3 or significant systolic murmur. Extremities: No pitting edema, no ulcerations.  ECG:  An ECG dated 01/06/2020 was personally reviewed today and demonstrated:  Sinus bradycardia with left atrial enlargement.  Recent Labwork: No results found for requested labs within last 365 days.     Component Value Date/Time   CHOL 242 (H) 01/13/2020 0833   TRIG 181 (H) 01/13/2020 0833   HDL 39 (L) 01/13/2020 0833   CHOLHDL 6.2 01/13/2020 0833   VLDL 36 01/13/2020 0833   LDLCALC 167 (H) 01/13/2020 0833   LDLDIRECT 96.7 10/19/2006 1506   Other Studies Reviewed Today:  Carotid Dopplers 01/11/2021: Summary:  Right Carotid: Velocities in the right ICA are consistent with a 1-39%  stenosis.   Left Carotid: Velocities in the left ICA are consistent with a 1-39%  stenosis.               Increased velocity at mid ICA at area of curve ( no plaque).   Vertebrals:  Bilateral vertebral arteries demonstrate antegrade flow.  Subclavians: Normal flow hemodynamics were seen in bilateral subclavian               arteries.   Lower extremity venous studies 01/11/2021: Summary:  Right:  - No evidence of deep vein thrombosis seen in the right lower extremity,  from the common femoral through the popliteal veins.  - No evidence of superficial venous reflux seen in the right short  saphenous vein.  - Venous reflux is noted in the right common femoral vein.  - Venous reflux is noted in the right sapheno-femoral junction.   - The right greater saphenous  vein harvested.    Left:  - No evidence of deep vein thrombosis seen in the left lower extremity,  from the common femoral through the popliteal veins.  - No evidence of superficial venous reflux seen in the left greater  saphenous vein.  - No evidence of superficial venous reflux seen in the left short  saphenous vein.   ABIs 01/11/2021: Summary:  Right: Resting right ankle-brachial index indicates moderate right lower  extremity arterial disease. The right toe-brachial index is abnormal.   Left: Resting left ankle-brachial index indicates moderate left lower  extremity arterial disease. The left toe-brachial index is abnormal.   Assessment and Plan:  1.  Multivessel CAD status post CABG in 2017.  ECG reviewed and stable.  Plan to continue observation on medical therapy in the  absence of accelerating angina.  She remains on aspirin, Plavix, Lopressor, and as needed nitroglycerin which will be refilled.  2.  Longstanding tobacco abuse.  We have discussed smoking cessation.  3.  Mixed hyperlipidemia with statin myalgias, intolerance to Zetia, and intolerance to Praluent.  Plan to check FLP and make referral to the lipid clinic for further discussion.  4.  PAD with continued follow-up by [facility].  Medication Adjustments/Labs and Tests Ordered: Current medicines are reviewed at length with the patient today.  Concerns regarding medicines are outlined above.   Tests Ordered: Orders Placed This Encounter  Procedures   Lipid panel   AMB Referral to [facility] Pharm-D   EKG 12-Lead    Medication Changes: Meds ordered this encounter  Medications   nitroGLYCERIN (NITROSTAT) 0.4 MG SL tablet    Sig: Place 1 tablet (0.4 mg total) under the tongue every 5 (five) minutes x 3 doses as needed for chest pain (if no relief after 3rd dose, call 911 or go to ED.).    Dispense:  25 tablet    Refill:  3    Disposition:  Follow up  6 months.  Signed, Jonelle Sidle, MD, [facility] 05/02/2022  5:16 PM    [facility] at [facility] [address] [place], [place], [place] [address] Phone: [phone]; Fax: [phone]

## 2022-05-05 ENCOUNTER — Other Ambulatory Visit: Payer: Self-pay | Admitting: Cardiology

## 2022-05-15 ENCOUNTER — Encounter (HOSPITAL_COMMUNITY): Payer: 59

## 2022-05-15 ENCOUNTER — Ambulatory Visit: Payer: 59

## 2022-05-26 ENCOUNTER — Encounter: Payer: Self-pay | Admitting: Pharmacist Clinician (PhC)/ Clinical Pharmacy Specialist

## 2022-05-26 ENCOUNTER — Ambulatory Visit (INDEPENDENT_AMBULATORY_CARE_PROVIDER_SITE_OTHER): Payer: 59 | Admitting: Pharmacist Clinician (PhC)/ Clinical Pharmacy Specialist

## 2022-05-26 ENCOUNTER — Ambulatory Visit (INDEPENDENT_AMBULATORY_CARE_PROVIDER_SITE_OTHER)
Admission: RE | Admit: 2022-05-26 | Discharge: 2022-05-26 | Disposition: A | Discharge status: Home / Self Care | Payer: 59 | Source: Ambulatory Visit | Attending: Physician Assistant | Admitting: Physician Assistant

## 2022-05-26 ENCOUNTER — Ambulatory Visit (INDEPENDENT_AMBULATORY_CARE_PROVIDER_SITE_OTHER): Payer: 59 | Admitting: Physician Assistant

## 2022-05-26 ENCOUNTER — Ambulatory Visit (HOSPITAL_COMMUNITY)
Admission: RE | Admit: 2022-05-26 | Discharge: 2022-05-26 | Disposition: A | Discharge status: Home / Self Care | Payer: 59 | Source: Ambulatory Visit | Attending: Physician Assistant | Admitting: Physician Assistant

## 2022-05-26 ENCOUNTER — Encounter: Payer: Self-pay | Admitting: *Deleted

## 2022-05-26 VITALS — Ht 62.0 in | Wt 133.7 lb

## 2022-05-26 VITALS — BP 160/77 | HR 51 | Temp 97.9°F | Resp 14 | Ht 62.0 in | Wt 132.0 lb

## 2022-05-26 DIAGNOSIS — I739 Peripheral vascular disease, unspecified: Secondary | ICD-10-CM | POA: Diagnosis not present

## 2022-05-26 DIAGNOSIS — I6523 Occlusion and stenosis of bilateral carotid arteries: Secondary | ICD-10-CM | POA: Insufficient documentation

## 2022-05-26 DIAGNOSIS — I70213 Atherosclerosis of native arteries of extremities with intermittent claudication, bilateral legs: Secondary | ICD-10-CM | POA: Diagnosis not present

## 2022-05-26 DIAGNOSIS — E782 Mixed hyperlipidemia: Secondary | ICD-10-CM | POA: Insufficient documentation

## 2022-05-26 DIAGNOSIS — I779 Disorder of arteries and arterioles, unspecified: Secondary | ICD-10-CM

## 2022-05-26 DIAGNOSIS — F17200 Nicotine dependence, unspecified, uncomplicated: Secondary | ICD-10-CM

## 2022-05-26 MED ORDER — REPATHA PUSHTRONEX SYSTEM 420 MG/3.5ML ~~LOC~~ SOCT: 420.0000 mg | SUBCUTANEOUS | 0 refills | Status: DC

## 2022-05-26 NOTE — Progress Notes (Unsigned)
Office Visit    Patient Name: Kelsey Hansen Date of Encounter: 05/26/2022  Primary Care Provider:  Patient, No Pcp Per Primary Cardiologist:  Rozann Lesches, MD  Chief Complaint    Hyperlipidemia   Significant Past Medical History   ASCVD 2017 CABG, on aspirin, clopidogrel, metoprolol  PAD Followed by [facility] - stents in R common and external iliac arteries in 2008, right to left fem-fem bypass, chronically occluded     Allergies  Allergen Reactions   Aleve [Naproxen] Palpitations   Crestor [Rosuvastatin] Other (See Comments)    Joint & muscle aches and pain   Lipitor [Atorvastatin] Other (See Comments)    Joint & muscle aches and pain   Pravachol [Pravastatin] Other (See Comments)    Joint & muscle aches and pain   Zocor [Simvastatin] Other (See Comments)    Joint & muscle aches and pain   Clopidogrel Nausea Only and Other (See Comments)    headache   Zetia [Ezetimibe] Other (See Comments)    Muscle, joint pain   Hydrocodone-Acetaminophen Nausea And Vomiting   Oxycodone-Acetaminophen Nausea And Vomiting    History of Present Illness    Kelsey Hansen is a 56 y.o. female patient of Dr Domenic Polite in the office today to discuss options for cholesterol lowering.  She states she had labs drawn earlier this month at [facility] in [place].  Will try to get those labs today.  She is in the office today with her daughter.    Insurance Carrier: [employer] Dual complete Medicare/Medicaid  LDL Cholesterol goal:  LDL < 70  Current Medications:   none  Previously tried:   atorvastatin, pravastatin, rosuvastatin, simvastatin - all caused myalgias   Ezetimibe - myalgias, dizziness   Alirocumab - headache, nausea, myalgias, - took about 4-5 doses, got worse with each  Family Hx:   father had MI in his 74's, died at 34; mother had MI at 20, died at 54 from lung cancer; 3 half siblings (mother), all fairly healthy; one daughter healthy  Social Hx: Tobacco: currently 1/2 + ppd, goal is to  stop in 2024, drops by 1 cigarette/day each week Alcohol:  very little   Diet:  more home cooked meals, variety of meats; vegetables mix of fresh/frozen and canned; doesn't snack much; drinks only water, ginger ale and coffee  Exercise: no regular exercise    Accessory Clinical Findings   05/16/22: labs from [facility] scanned into system:  TC 230, TG 185, HDL 37, LDL 156  Lab Results  Component Value Date   CHOL 242 (H) 01/13/2020   HDL 39 (L) 01/13/2020   LDLCALC 167 (H) 01/13/2020   LDLDIRECT 96.7 10/19/2006   TRIG 181 (H) 01/13/2020   CHOLHDL 6.2 01/13/2020    Lab Results  Component Value Date   ALT 16 02/19/2016   AST 22 02/19/2016   ALKPHOS 72 02/19/2016   BILITOT 0.4 02/19/2016   Lab Results  Component Value Date   CREATININE 0.62 01/13/2020   BUN 12 01/13/2020   NA 137 01/13/2020   K 3.7 01/13/2020   CL 101 01/13/2020   CO2 27 01/13/2020   Lab Results  Component Value Date   HGBA1C 5.9 (H) 02/19/2016    Home Medications    Current Outpatient Medications  Medication Sig Dispense Refill   acetaminophen (TYLENOL) 500 MG tablet Take 1 tablet (500 mg total) by mouth every 6 (six) hours as needed. 90 tablet 1   aspirin EC 81 MG tablet Take 81  mg by mouth daily.      cetirizine (ZYRTEC) 10 MG tablet Take 10 mg by mouth daily.     diphenhydrAMINE (BENADRYL) 25 MG tablet Take 25 mg by mouth every 6 (six) hours as needed.     Evolocumab with Infusor (REPATHA PUSHTRONEX SYSTEM) 420 MG/3.5ML SOCT Inject 420 mg into the skin every 30 (thirty) minutes. 3.6 mL 0   metoprolol tartrate (LOPRESSOR) 25 MG tablet TAKE 1/2 TABLET BY MOUTH TWO TIMES A DAY 90 tablet 3   nitroGLYCERIN (NITROSTAT) 0.4 MG SL tablet Place 1 tablet (0.4 mg total) under the tongue every 5 (five) minutes x 3 doses as needed for chest pain (if no relief after 3rd dose, call 911 or go to ED.). 25 tablet 3   PLAVIX 75 MG tablet TAKE ONE TABLET BY MOUTH EVERY DAY 30 tablet 2   No current facility-administered  medications for this visit.     Assessment & Plan    Mixed hyperlipidemia Assessment:  LDL goal: < 70 mg/dl last LDLc  992  mg/dl (42/6834) Intolerance to high intensity/moderate intensity /low intensity statins  Intolerant to ezetimibe and alirocumab - both with myalgias as well Discussed next potential options (PCSK-9 inhibitors, bempedoic acid and inclisiran); mechanism of action, dosing, efficacy, side effects and cost.   Although pt has previous side effects to alirocumab, discussed option of trying evolocumab, as it is her best bet for getting LDL to goal   Plan: Will apply for PA for Repatha; and inform patient upon approval - patient willing to try medication She was given sample Repatha Pushtronix at the office to take home.   She knows to call if it causes her any problems.  Lipid lab due in 2-3 months after starting PCSK9i   Phillips Hay, PharmD CPP [facility] at [facility] [address] [place], [place] [address] 05/26/2022, 4:56 PM

## 2022-05-26 NOTE — Patient Instructions (Signed)
Your Results:             Your most recent labs (12/2019) Goal  Total Cholesterol 242 < 200  Triglycerides 181 < 150  HDL (happy/good cholesterol) 39 > 40  LDL (lousy/bad cholesterol 167 < 70   Medication changes:  We will start the process to get Repatha covered by your insurance.  Once the prior authorization is complete, I will call you to let you know and confirm pharmacy information.   You will use 1 pod every month (roughly every 30 days).  Use the sample pod and then let me know if you have any problems or concerns.  You can reach me directly at [phone] Belenda Cruise or [place])  Lab orders:  We want to repeat labs after 2-3 months.  We will send you a lab order to remind you once we get closer to that time.     Thank you for choosing [facility]

## 2022-05-26 NOTE — Progress Notes (Signed)
Office Note     CC:  follow up Requesting Provider:  Graceann Congress, PA-C  HPI: Kelsey Hansen is a 56 y.o. (April 20, 1966) female who presents for surveillance of PA D as well as carotid artery stenosis.  She has history of right common and external iliac artery stenting by Dr. Madilyn Fireman in 2008.  Previously she also had a right to left femoral to femoral bypass which has been chronically occluded.  She claudicate's in both calfs after walking long distances or uphill however this does not impact her day-to-day life.  She denies any rest pain or tissue loss of bilateral lower extremities.  She is taking an aspirin and a Plavix daily.  She smokes half a pack a day and is trying to quit.  She is also followed for carotid artery stenosis.  1 year ago she had 1 to 39% stenosis of bilateral internal carotid arteries.  She denies any diagnosis of TIA or CVA since last office visit.  She also denies any neurological events including slurring speech, changes in vision, or one-sided weakness.   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[facility])    Postcatheterization complication, required surgical repair   Coronary atherosclerosis of native coronary artery    Multivessel   Hyperlipidemia    NSTEMI (non-ST elevated myocardial infarction) (HCC) 11/07   PTCA of distal circumflex 2007, residual 50% LAD and 80% PDA   Peripheral arterial disease (HCC)    Right common and external iliac stent, right to left fem-fem bypass    Past Surgical History:  Procedure Laterality Date   CARDIAC CATHETERIZATION  02/15/2016   CARDIAC CATHETERIZATION N/A 02/15/2016   Procedure: Left Heart Cath and Coronary Angiography;  Surgeon: Lyn Records, MD;  Location: [facility] INVASIVE CV LAB;  Service: Cardiovascular;  Laterality: N/A;   CORONARY ARTERY BYPASS GRAFT N/A 02/20/2016   Procedure: CORONARY ARTERY BYPASS GRAFTING (CABG)x three,  using left internal mammary artery and right greater saphenous  leg vein using endoscope.;  Surgeon: Kerin Perna, MD;  Location: [facility] OR;  Service: Open Heart Surgery;  Laterality: N/A;   FEMORAL BYPASS  7/08   Right to left; followed by right external iliac artery stent placed 12/08   PR VEIN BYPASS GRAFT,AORTO-FEM-POP     SP [place] TRANSCATHETER OCCLUSION     Occlusion off her fem-fem bypass with claudication in both legs   TEE WITHOUT CARDIOVERSION N/A 02/20/2016   Procedure: TRANSESOPHAGEAL ECHOCARDIOGRAM (TEE);  Surgeon: Kerin Perna, MD;  Location: [facility] OR;  Service: Open Heart Surgery;  Laterality: N/A;    Social History   Socioeconomic History   Marital status: Divorced    Spouse name: Not on file   Number of children: Not on file   Years of education: Not on file   Highest education level: Not on file  Occupational History   Not on file  Tobacco Use   Smoking status: Every Day    Packs/day: 0.25    Years: 30.00    Total pack years: 7.50    Types: Cigarettes    Start date: 01/27/1975   Smokeless tobacco: Never   Tobacco comments:    10 cigarettes per day  Vaping Use   Vaping Use: Never used  Substance and Sexual Activity   Alcohol use: No   Drug use: No   Sexual activity: Not on file  Other Topics Concern   Not on file  Social History Narrative   Not  on file   Social Determinants of Health   Financial Resource Strain: Not on file  Food Insecurity: Not on file  Transportation Needs: Not on file  Physical Activity: Not on file  Stress: Not on file  Social Connections: Not on file  Intimate Partner Violence: Not on file    Family History  Problem Relation Age of Onset   Cancer Mother        Lung   Heart disease Mother        Heart Disease before age 91   Heart attack Mother 31   Deep vein thrombosis Father    Arthritis Father        Back   Varicose Veins Father    Cancer Sister        Breast   Diabetes Neg Hx    Hypertension Neg Hx    Coronary artery disease Neg Hx     Current Outpatient Medications   Medication Sig Dispense Refill   acetaminophen (TYLENOL) 500 MG tablet Take 1 tablet (500 mg total) by mouth every 6 (six) hours as needed. 90 tablet 1   aspirin EC 81 MG tablet Take 81 mg by mouth daily.      cetirizine (ZYRTEC) 10 MG tablet Take 10 mg by mouth daily.     diphenhydrAMINE (BENADRYL) 25 MG tablet Take 25 mg by mouth every 6 (six) hours as needed.     metoprolol tartrate (LOPRESSOR) 25 MG tablet TAKE 1/2 TABLET BY MOUTH TWO TIMES A DAY 90 tablet 3   nitroGLYCERIN (NITROSTAT) 0.4 MG SL tablet Place 1 tablet (0.4 mg total) under the tongue every 5 (five) minutes x 3 doses as needed for chest pain (if no relief after 3rd dose, call 911 or go to ED.). 25 tablet 3   PLAVIX 75 MG tablet TAKE ONE TABLET BY MOUTH EVERY DAY 30 tablet 2   No current facility-administered medications for this visit.    Allergies  Allergen Reactions   Aleve [Naproxen] Palpitations   Crestor [Rosuvastatin] Other (See Comments)    Joint & muscle aches and pain   Lipitor [Atorvastatin] Other (See Comments)    Joint & muscle aches and pain   Pravachol [Pravastatin] Other (See Comments)    Joint & muscle aches and pain   Zocor [Simvastatin] Other (See Comments)    Joint & muscle aches and pain   Clopidogrel Nausea Only and Other (See Comments)    headache   Zetia [Ezetimibe] Other (See Comments)    Muscle, joint pain   Hydrocodone-Acetaminophen Nausea And Vomiting   Oxycodone-Acetaminophen Nausea And Vomiting     REVIEW OF SYSTEMS:   [X]  denotes positive finding, [ ]  denotes negative finding Cardiac  Comments:  Chest pain or chest pressure:    Shortness of breath upon exertion:    Short of breath when lying flat:    Irregular heart rhythm:        Vascular    Pain in calf, thigh, or hip brought on by ambulation:    Pain in feet at night that wakes you up from your sleep:     Blood clot in your veins:    Leg swelling:         Pulmonary    Oxygen at home:    Productive cough:      Wheezing:         Neurologic    Sudden weakness in arms or legs:     Sudden numbness in arms or legs:  Sudden onset of difficulty speaking or slurred speech:    Temporary loss of vision in one eye:     Problems with dizziness:         Gastrointestinal    Blood in stool:     Vomited blood:         Genitourinary    Burning when urinating:     Blood in urine:        Psychiatric    Major depression:         Hematologic    Bleeding problems:    Problems with blood clotting too easily:        Skin    Rashes or ulcers:        Constitutional    Fever or chills:      PHYSICAL EXAMINATION:  Vitals:   05/26/22 0918  BP: (!) 160/77  Pulse: (!) 51  Resp: 14  Temp: 97.9 F (36.6 C)  TempSrc: Temporal  SpO2: 99%  Weight: 132 lb (59.9 kg)  Height: 5\' 2"  (1.575 m)    General:  WDWN in NAD; vital signs documented above Gait: Not observed HENT: WNL, normocephalic Pulmonary: normal non-labored breathing , without Rales, rhonchi,  wheezing Cardiac: regular HR Abdomen: soft, NT, no masses Skin: without rashes Vascular Exam/Pulses:  Right Left  Radial 2+ (normal) 2+ (normal)  Brisk right AT and PT by Doppler; brisk left AT and PT by Doppler Extremities: without ischemic changes, without Gangrene , without cellulitis; without open wounds;  Musculoskeletal: no muscle wasting or atrophy  Neurologic: A&O X 3; cranial nerves grossly intact Psychiatric:  The pt has Normal affect.   Non-Invasive Vascular Imaging:     1 to 39% stenosis of bilateral internal carotid arteries  ABI/TBIToday's ABIToday's TBIPrevious ABIPrevious TBI  +-------+-----------+-----------+------------+------------+  Right 0.52       0.26       0.53        0.24          +-------+-----------+-----------+------------+------------+  Left  0.64       0.42       0.63        0.44          +-------+-----------+-----------+------------+-------     ASSESSMENT/PLAN:: 56 y.o. female here  for surveillance of PAD as well as carotid artery stenosis  -Patient does have claudication symptoms of both calfs after walking long distances however this is not lifestyle limiting.  She has a known occluded right to left femoral-femoral bypass.  Without rest pain or tissue loss there is no indication for revascularization at this time.  We will repeat ABIs in 1 year.  She will continue her aspirin and Plavix daily.  I encouraged smoking cessation.  -Carotid artery stenosis also unchanged over the past year.  She has 1 to 39% stenosis of bilateral internal carotid arteries.  We will repeat carotid duplex in 1 year as well.   59, PA-C [facility] [phone]  Clinic MD:   [phone]

## 2022-05-26 NOTE — Assessment & Plan Note (Addendum)
Assessment:  LDL goal: < 70 mg/dl last LDLc  594  mg/dl (58/5929) Intolerance to high intensity/moderate intensity /low intensity statins  Intolerant to ezetimibe and alirocumab - both with myalgias as well Discussed next potential options (PCSK-9 inhibitors, bempedoic acid and inclisiran); mechanism of action, dosing, efficacy, side effects and cost.   Although pt has previous side effects to alirocumab, discussed option of trying evolocumab, as it is her best bet for getting LDL to goal   Plan: Will apply for PA for Repatha; and inform patient upon approval - patient willing to try medication She was given sample Repatha Pushtronix at the office to take home.   She knows to call if it causes her any problems.  Lipid lab due in 2-3 months after starting [facility]

## 2022-06-02 ENCOUNTER — Other Ambulatory Visit: Payer: Self-pay | Admitting: Cardiology

## 2022-06-03 ENCOUNTER — Telehealth: Payer: Self-pay | Admitting: Pharmacist Clinician (PhC)/ Clinical Pharmacy Specialist

## 2022-06-03 NOTE — Telephone Encounter (Signed)
PA sent to [facility] for Repatha pushtronix  Key [id number]

## 2022-08-04 ENCOUNTER — Other Ambulatory Visit: Payer: Self-pay | Admitting: Cardiology

## 2022-11-26 ENCOUNTER — Encounter: Payer: Self-pay | Admitting: Cardiology

## 2022-11-26 ENCOUNTER — Ambulatory Visit: Payer: 59 | Attending: Cardiology | Admitting: Cardiology

## 2022-11-26 VITALS — BP 160/60 | HR 67 | Ht 62.0 in | Wt 138.4 lb

## 2022-11-26 DIAGNOSIS — I25119 Atherosclerotic heart disease of native coronary artery with unspecified angina pectoris: Secondary | ICD-10-CM

## 2022-11-26 DIAGNOSIS — E782 Mixed hyperlipidemia: Secondary | ICD-10-CM

## 2022-11-26 DIAGNOSIS — Z79899 Other long term (current) drug therapy: Secondary | ICD-10-CM | POA: Diagnosis not present

## 2022-11-26 DIAGNOSIS — I10 Essential (primary) hypertension: Secondary | ICD-10-CM

## 2022-11-26 DIAGNOSIS — Z789 Other specified health status: Secondary | ICD-10-CM

## 2022-11-26 MED ORDER — LOSARTAN POTASSIUM 25 MG PO TABS: 25.0000 mg | ORAL_TABLET | Freq: Every day | ORAL | 3 refills | Status: DC

## 2022-11-26 NOTE — Progress Notes (Signed)
Cardiology Office Note  Date: 11/26/2022   ID: CANESHIA GOUGHNOUR, DOB [birth year], MRN [id number]  History of Present Illness: Kelsey Hansen is a 57 y.o. female last seen in November 2023.  She is here for a routine visit.  Reports stable angina, no increasing nitroglycerin requirement.  Has had some mild swelling in her ankles, no orthopnea or PND.  She does not describe any palpitations or syncope.  No increasing claudication.  I reviewed her medications, she reports compliance with therapy.  She states that she has not heard back from the lipid clinic regarding coverage for Repatha.  We discussed addition of Cozaar for blood pressure control.  She continues to follow with [facility], I reviewed her interval Doppler studies from December 2023.  Physical Exam: VS:  BP (!) 160/60   Pulse 67   Ht 5\' 2"  (1.575 m)   Wt 138 lb 6.4 oz (62.8 kg)   SpO2 95%   BMI 25.31 kg/m , BMI Body mass index is 25.31 kg/m.  Wt Readings from Last 3 Encounters:  11/26/22 138 lb 6.4 oz (62.8 kg)  05/26/22 133 lb 11.2 oz (60.6 kg)  05/26/22 132 lb (59.9 kg)    General: Patient appears comfortable at rest. HEENT: Conjunctiva and lids normal. Neck: Supple, no elevated JVP or carotid bruits. Lungs: Clear to auscultation, nonlabored breathing at rest. Cardiac: Regular rate and rhythm, no S3 or significant systolic murmur Extremities: No pitting edema.  ECG:  An ECG dated 05/02/2022 was personally reviewed today and demonstrated:  Cardio left atrial enlargement and septal Q waves.  Labwork:  December 2023: Cholesterol 230, triglycerides 185, HDL 37, LDL 156  Other Studies Reviewed Today:  ABIs 05/26/2022: Summary:  Right: Resting right ankle-brachial index indicates moderate right lower  extremity arterial disease. The right toe-brachial index is abnormal.   Left: Resting left ankle-brachial index indicates moderate left lower  extremity arterial disease. The left toe-brachial index is abnormal.    Carotid Dopplers 05/26/2022: Summary:  Right Carotid: Velocities in the right ICA are consistent with a 1-39%  stenosis.   Left Carotid: Velocities in the left ICA are consistent with a 1-39%  stenosis.   Vertebrals: Bilateral vertebral arteries demonstrate antegrade flow.  Subclavians: Normal flow hemodynamics were seen in bilateral subclavian               arteries.   Assessment and Plan:  1.  CAD status post angioplasty of the distal circumflex in 2007 with moderate residual LAD and PDA disease managed medically at that time.  She subsequently underwent CABG in 2017 including LIMA to LAD, SVG to ramus intermedius, and SVG to RCA.  LVEF normal by evaluation at that time.  She reports stable angina, no increasing nitroglycerin requirement.  Continue aspirin, Plavix, and Lopressor.  2.  Mixed hyperlipidemia with history of statin myalgias and intolerance to Zetia as well as Praluent.  She had subsequent follow-up with the lipid clinic and recommendation to pursue Repatha.  Plan to check on coverage, she states that she has not heard back as yet.  3.  PAD status post right common and external iliac stent intervention and right to left femorofemoral bypass.  Also mild asymptomatic ICA disease.  She continues to follow with [facility].  Doppler studies noted from December 2023.  4.  Tobacco abuse.  Smoking cessation has been discussed over time, this has been a challenge for her.  5.  Essential hypertension.  Start Cozaar 25 mg daily, check BMET in  7 to 10 days.  Disposition:  Follow up  6 months.  Signed, Jonelle Sidle, M.D., F.A.C.C. [facility]

## 2022-11-26 NOTE — Patient Instructions (Addendum)
Medication Instructions:  Your physician has recommended you make the following change in your medication:  Start losartan 25 mg daily Continue all other medications the same  Labwork: BMET in 7-10 days @[facility]  [employer] or [employer] ([address]) Non-fasting  Testing/Procedures: none  Follow-Up: Your physician recommends that you schedule a follow-up appointment in: 6 months  Any Other Special Instructions Will Be Listed Below (If Applicable).  If you need a refill on your cardiac medications before your next appointment, please call your pharmacy.

## 2022-11-27 ENCOUNTER — Telehealth: Payer: Self-pay

## 2022-11-27 ENCOUNTER — Telehealth: Payer: Self-pay | Admitting: Pharmacist Clinician (PhC)/ Clinical Pharmacy Specialist

## 2022-11-27 ENCOUNTER — Other Ambulatory Visit (HOSPITAL_COMMUNITY): Payer: Self-pay

## 2022-11-27 NOTE — Telephone Encounter (Signed)
Pharmacy Patient Advocate Encounter  Prior Authorization for REPATHA has been approved.    PA# [id number] Effective dates: 11/27/22 through 06/16/23  Haze Rushing, CPhT Pharmacy Patient Advocate Specialist Direct Number: [phone] Fax: [phone]

## 2022-11-27 NOTE — Telephone Encounter (Signed)
Pharmacy Patient Advocate Encounter   Received notification from [facility] MEDICARE that prior authorization for REPATHA is needed.    PA submitted on 11/27/22 Key [id number] Status is pending  Haze Rushing, CPhT Pharmacy Patient Advocate Specialist Direct Number: [phone] Fax: [phone]

## 2022-11-27 NOTE — Telephone Encounter (Signed)
Please do PA for Repatha 

## 2022-11-27 NOTE — Telephone Encounter (Signed)
PA initiated, please see separate encounter for updates on determination. (I will route you back in once a decision has been made)  Jayon Matton, CPhT Pharmacy Patient Advocate Specialist Direct Number: [phone] Fax: [phone]  

## 2022-11-27 NOTE — Telephone Encounter (Signed)
Pt called with c/o intermittent low abdominal pain and BLE swelling/pain for about 2 months. She feels her leg pain is sometimes when she is walking and other times when she is resting. She has been scheduled for studies per APP and will see MD next. She verbalized understanding of her appt times and instructions.

## 2022-11-28 MED ORDER — REPATHA SURECLICK 140 MG/ML ~~LOC~~ SOAJ: 140.0000 mg | SUBCUTANEOUS | 12 refills | Status: DC

## 2022-11-28 NOTE — Addendum Note (Signed)
Addended by: Rosalee Kaufman on: 11/28/2022 11:23 AM   Modules accepted: Orders

## 2022-11-28 NOTE — Telephone Encounter (Signed)
Spoke with patient.  Did not like Pushtronix - left a bad taste in her mouth for 2 weeks.    Willing to try SureClick to see if it the same problem occurs.   Repeat labs in 3 months.  Patient to call back if cannot take

## 2022-12-01 ENCOUNTER — Telehealth: Payer: Self-pay | Admitting: Pharmacist Clinician (PhC)/ Clinical Pharmacy Specialist

## 2022-12-01 ENCOUNTER — Other Ambulatory Visit: Payer: Self-pay | Admitting: *Deleted

## 2022-12-01 DIAGNOSIS — I779 Disorder of arteries and arterioles, unspecified: Secondary | ICD-10-CM

## 2022-12-01 DIAGNOSIS — I739 Peripheral vascular disease, unspecified: Secondary | ICD-10-CM

## 2022-12-01 MED ORDER — REPATHA SURECLICK 140 MG/ML ~~LOC~~ SOAJ: 140.0000 mg | SUBCUTANEOUS | 0 refills | Status: DC

## 2022-12-01 NOTE — Telephone Encounter (Signed)
Patient Repatha pen mis-fired.  She will call manufacturer for replacement.  In the meantime I will give her a sample pen.

## 2022-12-02 ENCOUNTER — Ambulatory Visit (INDEPENDENT_AMBULATORY_CARE_PROVIDER_SITE_OTHER)
Admission: RE | Admit: 2022-12-02 | Discharge: 2022-12-02 | Disposition: A | Discharge status: Home / Self Care | Payer: 59 | Source: Ambulatory Visit | Attending: Vascular Surgery | Admitting: Vascular Surgery

## 2022-12-02 ENCOUNTER — Encounter: Payer: Self-pay | Admitting: Vascular Surgery

## 2022-12-02 ENCOUNTER — Ambulatory Visit (INDEPENDENT_AMBULATORY_CARE_PROVIDER_SITE_OTHER): Payer: 59 | Admitting: Vascular Surgery

## 2022-12-02 ENCOUNTER — Ambulatory Visit (HOSPITAL_COMMUNITY)
Admission: RE | Admit: 2022-12-02 | Discharge: 2022-12-02 | Disposition: A | Discharge status: Home / Self Care | Payer: 59 | Source: Ambulatory Visit | Attending: Vascular Surgery | Admitting: Vascular Surgery

## 2022-12-02 VITALS — BP 168/68 | HR 59 | Temp 98.2°F | Wt 134.0 lb

## 2022-12-02 DIAGNOSIS — I70213 Atherosclerosis of native arteries of extremities with intermittent claudication, bilateral legs: Secondary | ICD-10-CM | POA: Diagnosis not present

## 2022-12-02 DIAGNOSIS — I739 Peripheral vascular disease, unspecified: Secondary | ICD-10-CM | POA: Diagnosis present

## 2022-12-02 DIAGNOSIS — I779 Disorder of arteries and arterioles, unspecified: Secondary | ICD-10-CM | POA: Insufficient documentation

## 2022-12-02 NOTE — Progress Notes (Signed)
[facility] [place]  ASSESSMENT / PLAN: Kelsey Hansen is a 57 y.o. female with atherosclerosis of native arteries of bilateral lower extremities causing intermittent claudication. She reports lower abdominal discomfort when standing. She is mildly tender on exam. I see no external evidence of femoral-femoral bypass graft infection.   Recommend the following which can slow the progression of atherosclerosis and reduce the risk of major adverse cardiac / limb events:  Complete cessation from all tobacco products. Blood glucose control with goal A1c < 7%. Blood pressure control with goal blood pressure < 140/90 mmHg. Lipid reduction therapy with goal LDL-C <100 mg/dL (<16 if symptomatic from PAD).  Aspirin 81mg  PO QD.  Atorvastatin 40-80mg  PO QD (or other "high intensity" statin therapy).  Courage her to follow-up with a gastroenterologist and her primary care physician to discuss her episodic abdominal pain.  I do not have a clear vascular cause for her symptoms.  She should continue surveillance with me for peripheral arterial disease.  I will see her again in a year with an ankle-brachial index.  CHIEF COMPLAINT: abdominal pain  HISTORY OF PRESENT ILLNESS: Kelsey Hansen is a 57 y.o. female in clinic to discuss intermittent lower abdominal pain and bilateral extremity swelling and pain for last 2 months.  She has been followed in our clinic for peripheral arterial disease for some time.  She has a history of femoral-femoral bypass grafting and iliac stenting by Dr. Madilyn Fireman in 2008.  She has been stable over the last several evaluations in regards to her lower extremity arterial disease.  She has medium distance claudication which is not disabling to her.  She does not describe rest pain.  She does not describe ischemic ulceration.  She did notice some lower extremity swelling which has improved with pounds like a diuretic prescribed by her cardiologist.  She reports  intermittent suprapubic abdominal pain which she noticed when she is standing.  This can occur when standing for long time or short time.  There is no other provoking or alleviating symptoms.   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, required surgical repair   Coronary atherosclerosis of native coronary artery    Multivessel   Hyperlipidemia    NSTEMI (non-ST elevated myocardial infarction) (HCC) 11/07   PTCA of distal circumflex 2007, residual 50% LAD and 80% PDA   Peripheral arterial disease (HCC)    Right common and external iliac stent, right to left fem-fem bypass    Past Surgical History:  Procedure Laterality Date   CARDIAC CATHETERIZATION  02/15/2016   CARDIAC CATHETERIZATION N/A 02/15/2016   Procedure: Left Heart Cath and Coronary Angiography;  Surgeon: Lyn Records, MD;  Location: [facility] INVASIVE CV LAB;  Service: Cardiovascular;  Laterality: N/A;   CORONARY ARTERY BYPASS GRAFT N/A 02/20/2016   Procedure: CORONARY ARTERY BYPASS GRAFTING (CABG)x three,  using left internal mammary artery and right greater saphenous leg vein using endoscope.;  Surgeon: Kerin Perna, MD;  Location: [facility];  Service: Open Heart Surgery;  Laterality: N/A;   FEMORAL BYPASS  7/08   Right to left; followed by right external iliac artery stent placed 12/08   PR VEIN BYPASS GRAFT,AORTO-FEM-POP     [place] TRANSCATHETER OCCLUSION     Occlusion off her fem-fem bypass with claudication in both legs   TEE WITHOUT CARDIOVERSION N/A 02/20/2016   Procedure: TRANSESOPHAGEAL ECHOCARDIOGRAM (TEE);  Surgeon: Kathlee Nations  Maudie Flakes, MD;  Location: [facility] OR;  Service: Open Heart Surgery;  Laterality: N/A;    Family History  Problem Relation Age of Onset   Cancer Mother        Lung   Heart disease Mother        Heart Disease before age 44   Heart attack Mother 76   Deep vein thrombosis Father    Arthritis Father        Back   Varicose Veins  Father    Cancer Sister        Breast   Diabetes Neg Hx    Hypertension Neg Hx    Coronary artery disease Neg Hx     Social History   Socioeconomic History   Marital status: Divorced    Spouse name: Not on file   Number of children: Not on file   Years of education: Not on file   Highest education level: Not on file  Occupational History   Not on file  Tobacco Use   Smoking status: Every Day    Packs/day: 0.25    Years: 30.00    Additional pack years: 0.00    Total pack years: 7.50    Types: Cigarettes    Start date: 01/27/1975   Smokeless tobacco: Never   Tobacco comments:    10 cigarettes per day  Vaping Use   Vaping Use: Never used  Substance and Sexual Activity   Alcohol use: No   Drug use: No   Sexual activity: Not on file  Other Topics Concern   Not on file  Social History Narrative   Not on file   Social Determinants of Health   Financial Resource Strain: Not on file  Food Insecurity: Not on file  Transportation Needs: Not on file  Physical Activity: Not on file  Stress: Not on file  Social Connections: Not on file  Intimate Partner Violence: Not on file    Allergies  Allergen Reactions   Aleve [Naproxen] Palpitations   Atorvastatin Other (See Comments)    Joint & muscle aches and pain  Other Reaction(s): Unknown   Crestor [Rosuvastatin] Other (See Comments)    Joint & muscle aches and pain   Pravachol [Pravastatin] Other (See Comments)    Joint & muscle aches and pain   Zocor [Simvastatin] Other (See Comments)    Joint & muscle aches and pain   Clopidogrel Nausea Only and Other (See Comments)    headache   Zetia [Ezetimibe] Other (See Comments)    Muscle, joint pain   Hydrocodone-Acetaminophen Nausea And Vomiting   Oxycodone-Acetaminophen Nausea And Vomiting    Current Outpatient Medications  Medication Sig Dispense Refill   acetaminophen (TYLENOL) 500 MG tablet Take 1 tablet (500 mg total) by mouth every 6 (six) hours as needed. 90  tablet 1   aspirin EC 81 MG tablet Take 81 mg by mouth daily.      cetirizine (ZYRTEC) 10 MG tablet Take 10 mg by mouth daily.     diphenhydrAMINE (BENADRYL) 25 MG tablet Take 25 mg by mouth every 6 (six) hours as needed.     Evolocumab (REPATHA SURECLICK) 140 MG/ML SOAJ Inject 140 mg into the skin every 14 (fourteen) days. 2 mL 12   Evolocumab (REPATHA SURECLICK) 140 MG/ML SOAJ Inject 140 mg into the skin every 14 (fourteen) days. 1 mL 0   levocetirizine (XYZAL) 5 MG tablet Take 5 mg by mouth daily.     losartan (COZAAR) 25 MG tablet Take  1 tablet (25 mg total) by mouth daily. 90 tablet 3   metoprolol tartrate (LOPRESSOR) 25 MG tablet TAKE 1/2 TABLET BY MOUTH TWO TIMES A DAY 90 tablet 3   nitroGLYCERIN (NITROSTAT) 0.4 MG SL tablet Place 1 tablet (0.4 mg total) under the tongue every 5 (five) minutes x 3 doses as needed for chest pain (if no relief after 3rd dose, call 911 or go to ED.). 25 tablet 3   PLAVIX 75 MG tablet TAKE 1 TABLET BY MOUTH DAILY 60 tablet 5   No current facility-administered medications for this visit.    PHYSICAL EXAM There were no vitals filed for this visit.  Chronically ill woman appearing older than her stated age Regular rate and rhythm Unlabored breathing Abdomen is soft, nondistended.  There is mild tenderness about the suprapubic abdomen in the lower quadrants bilaterally.  No evidence of rebound or guarding.  I cannot palpate the femoral-femoral bypass graft.  There are no external signs of infection of the bypass graft. No palpable pedal pulses Feet are warm and well-perfused   PERTINENT LABORATORY AND RADIOLOGIC DATA  Most recent CBC    Latest Ref Rng & Units 03/01/2016    2:49 AM 02/28/2016    9:16 AM 02/25/2016    7:47 AM  CBC  WBC 4.0 - 10.5 K/uL 15.3  12.8  10.3   Hemoglobin 12.0 - 15.0 g/dL 8.8  9.1  8.3   Hematocrit 36.0 - 46.0 % 27.6  27.7  25.9   Platelets 150 - 400 K/uL 622  565  382      Most recent CMP    Latest Ref Rng & Units  01/13/2020    8:33 AM 03/01/2016    2:49 AM 02/29/2016    4:06 AM  CMP  Glucose 70 - 99 mg/dL 161  096  045   BUN 6 - 20 mg/dL 12  5  <5   Creatinine 0.44 - 1.00 mg/dL 4.09  8.11  9.14   Sodium 135 - 145 mmol/L 137  133  136   Potassium 3.5 - 5.1 mmol/L 3.7  3.7  4.0   Chloride 98 - 111 mmol/L 101  92  94   CO2 22 - 32 mmol/L 27  33  35   Calcium 8.9 - 10.3 mg/dL 9.3  9.2  9.6     Renal function CrCl cannot be calculated (Patient's most recent lab result is older than the maximum 21 days allowed.).  Hgb A1c MFr Bld (%)  Date Value  02/19/2016 5.9 (H)    LDL Cholesterol  Date Value Ref Range Status  01/13/2020 167 (H) 0 - 99 mg/dL Final    Comment:           Total Cholesterol/HDL:CHD Risk Coronary Heart Disease Risk Table                     Men   Women  1/2 Average Risk   3.4   3.3  Average Risk       5.0   4.4  2 X Average Risk   9.6   7.1  3 X Average Risk  23.4   11.0        Use the calculated Patient Ratio above and the CHD Risk Table to determine the patient's CHD Risk.        ATP III CLASSIFICATION (LDL):  <100     mg/dL   Optimal  782-956  mg/dL   Near or Above  Optimal  130-159  mg/dL   Borderline  161-096  mg/dL   High  >045     mg/dL   Very High Performed at [facility], [address], [place], [place] [address]    Direct LDL  Date Value Ref Range Status  10/19/2006 96.7 mg/dL Final    Comment:    See lab report for associated comment(s)     +-------+-----------+-----------+------------+------------+  ABI/TBIToday's ABIToday's TBIPrevious ABIPrevious TBI  +-------+-----------+-----------+------------+------------+  Right 0.52       0.14       0.52        0.26          +-------+-----------+-----------+------------+------------+  Left  0.54       0.34       0.64        0.42          +-------+-----------+-----------+------------+------------+   Rande Brunt. Lenell Antu, MD FACS [facility] Phone Number: [phone] 12/02/2022 7:48 AM   Total time spent on preparing this encounter including chart review, data review, collecting history, examining the patient, coordinating care for this established patient, 30 minutes.  Portions of this report may have been transcribed using voice recognition software.  Every effort has been made to ensure accuracy; however, inadvertent computerized transcription errors may still be present.

## 2022-12-03 LAB — VAS US ABI WITH/WO TBI
Left ABI: 0.54
Right ABI: 0.52

## 2022-12-10 ENCOUNTER — Encounter: Payer: Self-pay | Admitting: *Deleted

## 2022-12-13 ENCOUNTER — Other Ambulatory Visit: Payer: Self-pay

## 2022-12-13 DIAGNOSIS — I779 Disorder of arteries and arterioles, unspecified: Secondary | ICD-10-CM

## 2022-12-23 ENCOUNTER — Encounter: Payer: Self-pay | Admitting: Vascular Surgery

## 2023-02-09 ENCOUNTER — Telehealth: Payer: Self-pay | Admitting: Pharmacist Clinician (PhC)/ Clinical Pharmacy Specialist

## 2023-02-09 DIAGNOSIS — E782 Mixed hyperlipidemia: Secondary | ICD-10-CM

## 2023-02-09 NOTE — Telephone Encounter (Signed)
Lipid labs mailed.

## 2023-04-20 ENCOUNTER — Other Ambulatory Visit: Payer: Self-pay | Admitting: Cardiology

## 2023-05-04 ENCOUNTER — Other Ambulatory Visit: Payer: Self-pay | Admitting: Cardiology

## 2023-05-28 ENCOUNTER — Ambulatory Visit: Payer: 59 | Admitting: Nurse Practitioner

## 2023-07-07 ENCOUNTER — Ambulatory Visit: Payer: 59 | Admitting: Nurse Practitioner

## 2023-08-14 ENCOUNTER — Ambulatory Visit: Payer: 59 | Attending: Nurse Practitioner | Admitting: Nurse Practitioner

## 2023-08-14 ENCOUNTER — Encounter: Payer: Self-pay | Admitting: Nurse Practitioner

## 2023-08-14 VITALS — BP 148/60 | HR 53 | Ht 62.0 in | Wt 137.0 lb

## 2023-08-14 DIAGNOSIS — Z758 Other problems related to medical facilities and other health care: Secondary | ICD-10-CM

## 2023-08-14 DIAGNOSIS — I739 Peripheral vascular disease, unspecified: Secondary | ICD-10-CM

## 2023-08-14 DIAGNOSIS — I2510 Atherosclerotic heart disease of native coronary artery without angina pectoris: Secondary | ICD-10-CM

## 2023-08-14 DIAGNOSIS — I6523 Occlusion and stenosis of bilateral carotid arteries: Secondary | ICD-10-CM

## 2023-08-14 DIAGNOSIS — E785 Hyperlipidemia, unspecified: Secondary | ICD-10-CM | POA: Diagnosis not present

## 2023-08-14 DIAGNOSIS — I10 Essential (primary) hypertension: Secondary | ICD-10-CM

## 2023-08-14 DIAGNOSIS — Z720 Tobacco use: Secondary | ICD-10-CM

## 2023-08-14 DIAGNOSIS — Z7689 Persons encountering health services in other specified circumstances: Secondary | ICD-10-CM

## 2023-08-14 NOTE — Patient Instructions (Addendum)
 Medication Instructions:   Continue all current medications.   Labwork:  none  Testing/Procedures:  none  Follow-Up:  6 months    Any Other Special Instructions Will Be Listed Below (If Applicable).  Salty six Mediterranean diet BP log Smoking cessation  You have been referred to:  [facility]  Nurse BP check in 3-4 weeks  If you need a refill on your cardiac medications before your next appointment, please call your pharmacy.

## 2023-08-14 NOTE — Progress Notes (Unsigned)
 Cardiology Office Note:  .   Date: 08/14/2023 ID:  Kelsey Hansen, DOB Feb 03, 1966, MRN [id number] PCP: Patient, No Pcp Per  [facility] Providers Cardiologist:  Nona Dell, MD { Click to update primary MD,subspecialty MD or APP then REFRESH:1}   History of Present Illness: .   Kelsey Hansen is a 58 y.o. female with a PMH of CAD, s/p CABG, mixed HLD, PAD, s/p right common and external iliac stent intervention and R->L femorofemoral bypass, HTN, carotid artery disease, tobacco abuse, who presents today for 6 month follow-up.   Previous cardiovascular history of angioplasty of distal circumflex in 2007, was noted to have moderate residual LAD and PDA disease that was medically managed at the time.  Underwent CABG in 2017 (LIMA-LAD, SVG-ramus intermedius, and SVG to RCA).  Followed by the lipid clinic.  Last seen by Dr. Diona Browner on November 26, 2022.  Blood pressure was elevated at that time, started on losartan.  At that time, she had not heard back from the lipid clinic regarding coverage for Repatha.  Overall was doing well, noted some mild swelling to her ankles.  Today she presents for 95-month follow-up.  She states she is doing well from a cardiac perspective. Does note some shoulder pain, but overall doing well. Denies any chest pain, shortness of breath, palpitations, syncope, presyncope, dizziness, orthopnea, PND, swelling or significant weight changes, acute bleeding, or claudication. Could not tolerate Repatha as this caused "bad depression."  ROS: Negative. See HPI.  SH: Occasional tobacco use. Denies any alcohol use or illicit drug use.   Studies Reviewed: Marland Kitchen    EKG: EKG Interpretation Date/Time:  Friday August 14 2023 10:55:06 EST Ventricular Rate:  53 PR Interval:  146 QRS Duration:  84 QT Interval:  484 QTC Calculation: 454 R Axis:   82  Text Interpretation: Sinus bradycardia with sinus arrhythmia Possible Left atrial enlargement Septal infarct , age undetermined  When compared with ECG of 21-Feb-2016 06:46, Septal infarct is now Present Nonspecific T wave abnormality no longer evident in Inferior leads T wave inversion less evident in Anterolateral leads QT has lengthened Confirmed by Sharlene Dory [phone]) on 08/14/2023 11:04:17 AM   ABI's 11/2022: Summary:  Right: Resting right ankle-brachial index indicates moderate right lower  extremity arterial disease. The right toe-brachial index is abnormal.   Left: Resting left ankle-brachial index indicates moderate left lower  extremity arterial disease. The left toe-brachial index is abnormal.  Vascular US Aorta/IVC/Iliacs doppler 11/2022: See report under "CV Proc"  Echo 02/2016: Study Conclusions   - Left ventricle: The cavity size was normal. Wall thickness was    increased in a pattern of mild LVH. Systolic function was normal.    The estimated ejection fraction was in the range of 60% to 65%.    Wall motion was normal; there were no regional wall motion    abnormalities. Left ventricular diastolic function parameters    were normal.  - Aortic valve: Trileaflet. Sclerosis without stenosis. There was    mild regurgitation.  - Left atrium: The atrium was normal in size.  - Inferior vena cava: The vessel was normal in size. The    respirophasic diameter changes were in the normal range (>= 50%),    consistent with normal central venous pressure.   Impressions:   - LVEF 60-65%, mild LVH, normal wall motion, normal diastolic    function, aortic valve sclerosis with mild AI, normal LA size,    normal IVC.  LHC 02/2016: Prox RCA-2  lesion, 75 %stenosed. Prox RCA-1 lesion, 90 %stenosed. Mid RCA-2 lesion, 85 %stenosed. Mid RCA-1 lesion, 80 %stenosed. Ost Ramus to Ramus lesion, 65 %stenosed. Prox Cx to Mid Cx lesion, 75 %stenosed. 1st Diag lesion, 50 %stenosed. Ost LAD lesion, 90 %stenosed. The left ventricular ejection fraction is 55-65% by visual estimate. The left ventricular systolic function  is normal. LV end diastolic pressure is normal.   Severe 2 vessel coronary disease involving the right coronary diffusely (relatively small vessel), and significant proximal LAD involvement.  The circumflex contains moderate coronary disease including the site of previous angioplasty in the mid vessel at the time of infarction in 2007. Ramus has intermediate stenosis. Normal left ventricular systolic function.  LVEF at least 60% with normal EDP. Severe peripheral vascular disease with right iliac stents and left femoral-femoral bypass.  Prior right brachial occlusion at the time of catheterization 2007 requiring thrombectomy by Dr. Bud Face.   Recommendations:   Heart team approach to determine the best treatment option in this 57 year old with both coronary and peripheral arterial disease, hyperlipidemia, and continued heavy cigarette smoking.  From a percutaneous standpoint the LAD lesion could be stented and a nice result could be expected.  The circumflex disease is not critical and could be managed medically.  The right coronary would be problematic to treat because of the diffuse nature of disease. This might be better treated medically as well. The vessel is small and may be graftable. TCTS will consult and discussion will allow [place] to determine if a surgical approach with mammary to LAD and grafting of the right coronary would be a more favorable approach for this lady over the long term. For the time being will resume heparin, hold Plavix, continue other medications as listed, and engage in conversation that will hopefully allow either PCI or CABG to be done early next week. Counseled to discontinue smoking. Lexiscan 02/2016: Blood pressure demonstrated a hypertensive response to exercise. Downsloping ST segment depression ST segment depression of 3 mm was noted during stress in the II, III, aVF, V3, V4, V5 and V6 leads. Incomplete normalization in recovery Pt transferred to [facility] for cardiac catheterization  Physical Exam:   VS:  BP (!) 148/60 (BP Location: Left Arm, Patient Position: Sitting, Cuff Size: Normal)   Pulse (!) 53   Ht 5\' 2"  (1.575 m)   Wt 137 lb (62.1 kg)   SpO2 98%   BMI 25.06 kg/m    Wt Readings from Last 3 Encounters:  08/14/23 137 lb (62.1 kg)  12/02/22 134 lb (60.8 kg)  11/26/22 138 lb 6.4 oz (62.8 kg)    GEN: Well nourished, well developed in no acute distress NECK: No JVD; No carotid bruits CARDIAC: S1/S2, slow rate and regular rhythm, no murmurs, rubs, gallops RESPIRATORY:  Clear to auscultation without rales, wheezing or rhonchi  ABDOMEN: Soft, non-tender, non-distended EXTREMITIES:  No edema; No deformity   ASSESSMENT AND PLAN: .    CAD, s/p CABG Stable with no anginal symptoms. No indication for ischemic evaluation. Continue ASA, Lopressor, Plavix, and NTG PRN. Heart healthy diet and regular cardiovascular exercise encouraged.   HTN BP stable. Discussed to monitor BP at home at least 2 hours after medications and sitting for 5-10 minutes. BP is not at goal today. Discussed SBP goal < 130. Will bring back in 1 week for nurse visit to check BP. If BP not at goal, plan to start ARB/Ace-inhibitor. Heart healthy diet and regular cardiovascular exercise encouraged.   HLD  No recent lipid panel on file. No longer on Repatha as this caused "depression." Unclear that this is an adverse effect.     PAD Carotid artery disease Tobacco abuse   Repatha real bad depression - no to lipid clinic.   Talk to pharmacist about leqvio.       Referral for [facility]. Hot flashes.  {Are you ordering a CV Procedure (e.g. stress test, cath, DCCV, TEE, etc)?   Press F2        :[id number]}  Dispo: ***  Signed, Sharlene Dory, NP

## 2023-08-21 ENCOUNTER — Telehealth: Payer: Self-pay | Admitting: Cardiology

## 2023-08-21 NOTE — Telephone Encounter (Signed)
 Pt is returning call to nurse about appts. Pt said she was confused about the message

## 2023-08-21 NOTE — Telephone Encounter (Signed)
 Spoke with patient - corrected her f/u appointment - she needed 6 month - scheduled as requested at office visit.  Nurse visit given for 3-4 weeks.

## 2023-08-24 ENCOUNTER — Ambulatory Visit: Payer: 59

## 2023-09-11 ENCOUNTER — Telehealth: Payer: Self-pay | Admitting: *Deleted

## 2023-09-11 NOTE — Telephone Encounter (Signed)
 Left message for patient to call office to switch nurse visit time on Monday to 1:00 pm or 1:15 pm.

## 2023-09-11 NOTE — Telephone Encounter (Signed)
 Agree to time change on 09/14/2023 for nurse visit. New time is 1:15 pm.

## 2023-09-14 ENCOUNTER — Ambulatory Visit: Attending: Cardiology | Admitting: *Deleted

## 2023-09-14 ENCOUNTER — Encounter: Payer: Self-pay | Admitting: *Deleted

## 2023-09-14 ENCOUNTER — Ambulatory Visit

## 2023-09-14 VITALS — BP 154/64 | HR 56 | Ht 62.0 in | Wt 141.0 lb

## 2023-09-14 DIAGNOSIS — I10 Essential (primary) hypertension: Secondary | ICD-10-CM | POA: Diagnosis not present

## 2023-09-14 MED ORDER — NITROGLYCERIN 0.4 MG SL SUBL: 0.4000 mg | SUBLINGUAL_TABLET | SUBLINGUAL | 3 refills | Status: AC | PRN

## 2023-09-14 NOTE — Progress Notes (Unsigned)
 Presents for nurse visit for blood pressure recheck per last office visit. Medications reviewed. Reports taking all doses of medications without side effects. Denies dizziness, chest pain or sob. Reports eating a large portion of chinese food including soy sauce as well as admits to smoking a cigarette before arriving to office. Vitals taken and sent to provider for review.

## 2023-09-15 ENCOUNTER — Encounter: Payer: Self-pay | Admitting: *Deleted

## 2023-09-15 ENCOUNTER — Telehealth: Payer: Self-pay | Admitting: *Deleted

## 2023-09-15 MED ORDER — OMRON 10 SERIES BP MONITOR DEVI: 1.0000 | 0 refills | Status: AC

## 2023-09-15 NOTE — Addendum Note (Signed)
 Addended by: Eustace Moore on: 09/15/2023 10:25 AM   Modules accepted: Orders

## 2023-09-15 NOTE — Telephone Encounter (Signed)
-----   Message from Sharlene Dory sent at 09/14/2023  3:30 PM EDT ----- Thank you for seeing her.   She needs to cut out salt in her diet and recommend sending her the Salty Six. Please have her avoid smoking and bring her back in for BP check in 2-3 weeks. Needs to monitor BP at home at least 2 hours after medications and sitting for 5-10 minutes and bring in log for review at next RN visit.   Thanks!   Best, Sharlene Dory, NP ----- Message ----- From: Eustace Moore, RN Sent: 09/14/2023   1:41 PM EDT To: Sharlene Dory, NP

## 2023-09-15 NOTE — Telephone Encounter (Signed)
 Patient informed and verbalized understanding of plan. Salty Six mailed to patient.

## 2023-09-18 ENCOUNTER — Ambulatory Visit: Payer: 59 | Admitting: Nurse Practitioner

## 2023-10-02 ENCOUNTER — Telehealth: Payer: Self-pay | Admitting: Nurse Practitioner

## 2023-10-02 NOTE — Telephone Encounter (Signed)
 Rescheduled patient for Friday October 09, 2023 at 9:00 am for follow up nurse visit- BP check. Patient was agreeable with time and voiced understanding that NV will be in the [place] office.

## 2023-10-02 NOTE — Telephone Encounter (Signed)
 Pt called in to r/s her nurse visit that was scheduled for 10/06/23.

## 2023-10-06 ENCOUNTER — Ambulatory Visit

## 2023-10-09 ENCOUNTER — Telehealth: Payer: Self-pay | Admitting: Cardiology

## 2023-10-09 ENCOUNTER — Ambulatory Visit

## 2023-10-09 NOTE — Telephone Encounter (Signed)
 Patient stated she woke up with pink eye and canceled Nurse Visit.  Patient stated she will call back to reschedule.

## 2023-10-15 ENCOUNTER — Ambulatory Visit: Payer: Self-pay | Admitting: Internal Medicine

## 2023-11-03 ENCOUNTER — Other Ambulatory Visit: Payer: Self-pay | Admitting: Cardiology

## 2024-01-14 ENCOUNTER — Telehealth: Payer: Self-pay | Admitting: Nurse Practitioner

## 2024-01-14 NOTE — Telephone Encounter (Signed)
 Spoke with patient regarding an upcoming appointment.She states that she needs to get a refill on a BP medication that she stopped taking. She is uncertain which medication it was.

## 2024-01-14 NOTE — Telephone Encounter (Signed)
 Left patient a message to get more info regarding this including which medication she is talking about.

## 2024-01-15 NOTE — Telephone Encounter (Signed)
 Patient informed and verbalized understanding of plan.

## 2024-01-15 NOTE — Telephone Encounter (Signed)
 Spoke with patient regarding this. She states she wants to restart Losartan  for her BP the only readings she could provide me is  Yesterday-146/80 HR 77 Today-153/64 HR 63 I advised patient per E. Miriam NP last note states  Will bring back in 3-4 weeks for nurse visit to check BP. If BP not at goal, plan to start low dose Norvasc. Losartan  in the past has caused dizziness- says she could not tolerate.   Patient states she understands and she is willing to try a different medication other than losartan  if the provider is willing. Advised patient to start taking BP everyday so she can provide us  with readings. Advised patient once I spoke with the provider I would let her know her advice. Patient currently uses- [facility] in [place]

## 2024-01-25 MED ORDER — AMLODIPINE BESYLATE 2.5 MG PO TABS: 2.5000 mg | ORAL_TABLET | Freq: Every day | ORAL | 3 refills | Status: DC

## 2024-01-25 MED ORDER — METOPROLOL TARTRATE 25 MG PO TABS: 12.5000 mg | ORAL_TABLET | Freq: Two times a day (BID) | ORAL | 2 refills | Status: AC

## 2024-01-25 NOTE — Telephone Encounter (Signed)
 I spoke with patient and she agrees to start amlodipine  2.5 mg daily and she will bring bp readings to 02/12/24 follow up appointment.

## 2024-01-25 NOTE — Telephone Encounter (Signed)
 I will forward bp log to E.Peck,NP for review

## 2024-01-25 NOTE — Telephone Encounter (Signed)
 Patient following up to provide BP readings:  7//31: 138/70 60 AM, 129/65 70 PM 8/01: 142/65 70 AM, 138/74 78 PM 8/02: 153/64 63 AM, 129/65 71 PM 8/03: 137/54 51 AM, 170/63 54 PM 8/04: 181/65 65 AM, 152/66 72 PM  8/05: 154/61 68 AM, 148/57 64 PM 8/06: 201/78 69 AM, 146/57 61 PM 8/07: 174/74 65 AM, 142/57 65 PM 8/08: 196/75 75 AM, 147/76 66 PM  8/09: 174/74 70 AM, 171/75 70 PM 8/10: 168/78 61 AM, 152/66 60 PM 8/11: 174/75 66 AM, 163/71 63 PM  Patient mentions that she still feels fatigued and she would like to go on BP medication.

## 2024-01-25 NOTE — Addendum Note (Signed)
 Addended by: Celese Banner A on: 01/25/2024 04:54 PM   Modules accepted: Orders

## 2024-02-12 ENCOUNTER — Ambulatory Visit: Attending: Nurse Practitioner | Admitting: Nurse Practitioner

## 2024-02-12 ENCOUNTER — Encounter: Payer: Self-pay | Admitting: Nurse Practitioner

## 2024-02-12 VITALS — BP 118/70 | HR 62 | Ht 62.0 in | Wt 133.6 lb

## 2024-02-12 DIAGNOSIS — I10 Essential (primary) hypertension: Secondary | ICD-10-CM

## 2024-02-12 DIAGNOSIS — I2510 Atherosclerotic heart disease of native coronary artery without angina pectoris: Secondary | ICD-10-CM

## 2024-02-12 DIAGNOSIS — Z720 Tobacco use: Secondary | ICD-10-CM

## 2024-02-12 DIAGNOSIS — Z79899 Other long term (current) drug therapy: Secondary | ICD-10-CM

## 2024-02-12 DIAGNOSIS — E782 Mixed hyperlipidemia: Secondary | ICD-10-CM

## 2024-02-12 DIAGNOSIS — I739 Peripheral vascular disease, unspecified: Secondary | ICD-10-CM

## 2024-02-12 DIAGNOSIS — I6523 Occlusion and stenosis of bilateral carotid arteries: Secondary | ICD-10-CM

## 2024-02-12 DIAGNOSIS — E785 Hyperlipidemia, unspecified: Secondary | ICD-10-CM | POA: Diagnosis not present

## 2024-02-12 MED ORDER — AMLODIPINE BESYLATE 5 MG PO TABS: 5.0000 mg | ORAL_TABLET | Freq: Every day | ORAL | 3 refills | Status: AC

## 2024-02-12 NOTE — Patient Instructions (Addendum)
 Medication Instructions:  Your physician has recommended you make the following change in your medication:  Please Increase Amlodipine  to 5 Mg daily   Labwork: In 1 week with [facility]   Testing/Procedures: None   Follow-Up: Your physician recommends that you schedule a follow-up appointment in: 6 months   Any Other Special Instructions Will Be Listed Below (If Applicable).  If you need a refill on your cardiac medications before your next appointment, please call your pharmacy.

## 2024-02-12 NOTE — Progress Notes (Signed)
 Cardiology Office Note:  .   Date: 02/12/2024 ID:  Kelsey Hansen Cancer, DOB 11-Apr-1966, MRN [id number] PCP: Patient, No Pcp Per  [facility] Providers Cardiologist:  Jayson Sierras, MD    History of Present Illness: .   Kelsey Hansen is a 58 y.o. female with a PMH of CAD, s/p CABG, mixed HLD, PAD, s/p right common and external iliac stent intervention and R->L femorofemoral bypass, HTN, carotid artery disease, tobacco abuse, who presents today for 6 month follow-up.   Previous cardiovascular history of angioplasty of distal circumflex in 2007, was noted to have moderate residual LAD and PDA disease that was medically managed at the time.  Underwent CABG in 2017 (LIMA-LAD, SVG-ramus intermedius, and SVG to RCA).  Followed by the lipid clinic.  Last seen by Dr. Sierras on November 26, 2022.  Blood pressure was elevated at that time, started on losartan .  At that time, she had not heard back from the lipid clinic regarding coverage for Repatha .  Overall was doing well, noted some mild swelling to her ankles.  08/14/2023 - Today she presents for 28-month follow-up.  She states she is doing well from a cardiac perspective. Does note some shoulder pain, but overall doing well. Denies any chest pain, shortness of breath, palpitations, syncope, presyncope, dizziness, orthopnea, PND, swelling or significant weight changes, acute bleeding, or claudication. Could not tolerate Repatha  as this caused bad depression.  02/12/2024 - She is here for follow-up. She has noticed higher readings on her new BP cuff. Also tells me she has a small knot she has noticed in mid of her chest, says she heard something pop and wants to make sure this is okay. Overall doing well. Denies any chest pain, shortness of breath, palpitations, syncope, presyncope, dizziness, orthopnea, PND, swelling or significant weight changes, acute bleeding, or claudication. Tells me she self-increased her Norvasc  to 5 mg daily - tolerating well.    ROS: Negative. See HPI.  SH: Occasional tobacco use. Denies any alcohol use or illicit drug use.   Studies Reviewed: SABRA    EKG: EKG is not ordered today.      ABI's 11/2022: Summary:  Right: Resting right ankle-brachial index indicates moderate right lower  extremity arterial disease. The right toe-brachial index is abnormal.   Left: Resting left ankle-brachial index indicates moderate left lower  extremity arterial disease. The left toe-brachial index is abnormal.  Vascular US  Aorta/IVC/Iliacs doppler 11/2022: See report under CV Proc  Echo 02/2016: Study Conclusions   - Left ventricle: The cavity size was normal. Wall thickness was    increased in a pattern of mild LVH. Systolic function was normal.    The estimated ejection fraction was in the range of 60% to 65%.    Wall motion was normal; there were no regional wall motion    abnormalities. Left ventricular diastolic function parameters    were normal.  - Aortic valve: Trileaflet. Sclerosis without stenosis. There was    mild regurgitation.  - Left atrium: The atrium was normal in size.  - Inferior vena cava: The vessel was normal in size. The    respirophasic diameter changes were in the normal range (>= 50%),    consistent with normal central venous pressure.   Impressions:   - LVEF 60-65%, mild LVH, normal wall motion, normal diastolic    function, aortic valve sclerosis with mild AI, normal LA size,    normal IVC.  LHC 02/2016: Prox RCA-2 lesion, 75 %stenosed. Prox RCA-1 lesion, 90 %  stenosed. Mid RCA-2 lesion, 85 %stenosed. Mid RCA-1 lesion, 80 %stenosed. Ost Ramus to Ramus lesion, 65 %stenosed. Prox Cx to Mid Cx lesion, 75 %stenosed. 1st Diag lesion, 50 %stenosed. Ost LAD lesion, 90 %stenosed. The left ventricular ejection fraction is 55-65% by visual estimate. The left ventricular systolic function is normal. LV end diastolic pressure is normal.   Severe 2 vessel coronary disease involving the right  coronary diffusely (relatively small vessel), and significant proximal LAD involvement.  The circumflex contains moderate coronary disease including the site of previous angioplasty in the mid vessel at the time of infarction in 2007. Ramus has intermediate stenosis. Normal left ventricular systolic function.  LVEF at least 60% with normal EDP. Severe peripheral vascular disease with right iliac stents and left femoral-femoral bypass.  Prior right brachial occlusion at the time of catheterization 2007 requiring thrombectomy by Dr. Cordella Hint.   Recommendations:   Heart team approach to determine the best treatment option in this 58 year old with both coronary and peripheral arterial disease, hyperlipidemia, and continued heavy cigarette smoking.  From a percutaneous standpoint the LAD lesion could be stented and a nice result could be expected.  The circumflex disease is not critical and could be managed medically.  The right coronary would be problematic to treat because of the diffuse nature of disease. This might be better treated medically as well. The vessel is small and may be graftable. TCTS will consult and discussion will allow us  to determine if a surgical approach with mammary to LAD and grafting of the right coronary would be a more favorable approach for this lady over the long term. For the time being will resume heparin , hold Plavix , continue other medications as listed, and engage in conversation that will hopefully allow either PCI or CABG to be done early next week. Counseled to discontinue smoking. Lexiscan  02/2016: Blood pressure demonstrated a hypertensive response to exercise. Downsloping ST segment depression ST segment depression of 3 mm was noted during stress in the II, III, aVF, V3, V4, V5 and V6 leads. Incomplete normalization in recovery Pt transferred to [facility] for cardiac catheterization  Physical Exam:   VS:  BP 118/70   Pulse 62   Ht 5' 2 (1.575 m)   Wt  133 lb 9.6 oz (60.6 kg)   SpO2 98%   BMI 24.44 kg/m    Wt Readings from Last 3 Encounters:  02/12/24 133 lb 9.6 oz (60.6 kg)  09/14/23 141 lb (64 kg)  08/14/23 137 lb (62.1 kg)    GEN: Well nourished, well developed in no acute distress NECK: No JVD; No carotid bruits CARDIAC: S1/S2, RRR, no murmurs, rubs, gallops RESPIRATORY:  Clear to auscultation without rales, wheezing or rhonchi  ABDOMEN: Soft, non-tender, non-distended EXTREMITIES:  No edema overall, small knot noted to left mid chest; No deformity   ASSESSMENT AND PLAN: .    CAD, s/p CABG Stable with no anginal symptoms. No indication for ischemic evaluation. Continue ASA, Lopressor , Plavix , and NTG PRN. Small localized swelling to left mid chest - most likely a lymph node, recommended to f/u with PCP and OBGYN. Verbalizes understanding. Heart healthy diet and regular cardiovascular exercise encouraged. Will obtain CBC.   HTN BP elevated at home, self-increased her norvasc . Stated okay to continue Norvasc  5 mg daily and discussed SBP goal < 130. Discussed to monitor BP at home at least 2 hours after medications and sitting for 5-10 minutes. No other medication changes at this time. Heart healthy diet and regular  cardiovascular exercise encouraged.   HLD, medication management Will obtain FLP and CMET.  No longer on Repatha  as this caused depression. Unclear that this is an adverse effect. She has refused referral to the Lipid Clinic. Heart healthy diet and regular cardiovascular exercise encouraged.   PAD Previous hx of right common and external iliac stent intervention and R->L femorofemoral bypass. Doing well and denies any symptoms. Continue current medication regimen. Heart healthy diet and regular cardiovascular exercise encouraged.   Carotid artery disease 1-39% stenosis of bilateral carotid arteries in 2023. Will continue to monitor. Denies any symptoms. Continue current medication regimen. Heart healthy diet and  regular cardiovascular exercise encouraged.    Tobacco abuse Smoking cessation encouraged and discussed.   Dispo: Discussed with her that she needs to find a PCP. Gave her a list. Follow-up in 6 months with MD/APP or sooner if anything changes.   Signed, Almarie Crate, NP

## 2024-02-18 LAB — CBC
Hematocrit: 44.8 % (ref 34.0–46.6)
Hemoglobin: 15.3 g/dL (ref 11.1–15.9)
MCH: 35.6 pg — ABNORMAL HIGH (ref 26.6–33.0)
MCHC: 34.2 g/dL (ref 31.5–35.7)
MCV: 104 fL — ABNORMAL HIGH (ref 79–97)
Platelets: 368 x10E3/uL (ref 150–450)
RBC: 4.3 x10E6/uL (ref 3.77–5.28)
RDW: 11.8 % (ref 11.7–15.4)
WBC: 11.5 x10E3/uL — ABNORMAL HIGH (ref 3.4–10.8)

## 2024-02-18 LAB — COMPREHENSIVE METABOLIC PANEL WITH GFR
ALT: 8 IU/L (ref 0–32)
AST: 15 IU/L (ref 0–40)
Albumin: 4.5 g/dL (ref 3.8–4.9)
Alkaline Phosphatase: 106 IU/L (ref 44–121)
BUN/Creatinine Ratio: 21 (ref 9–23)
BUN: 12 mg/dL (ref 6–24)
Bilirubin Total: 0.5 mg/dL (ref 0.0–1.2)
CO2: 20 mmol/L (ref 20–29)
Calcium: 9.6 mg/dL (ref 8.7–10.2)
Chloride: 100 mmol/L (ref 96–106)
Creatinine, Ser: 0.58 mg/dL (ref 0.57–1.00)
Globulin, Total: 2.5 g/dL (ref 1.5–4.5)
Glucose: 98 mg/dL (ref 70–99)
Potassium: 4.1 mmol/L (ref 3.5–5.2)
Sodium: 139 mmol/L (ref 134–144)
Total Protein: 7 g/dL (ref 6.0–8.5)
eGFR: 106 mL/min/1.73 (ref >59)

## 2024-02-25 ENCOUNTER — Telehealth: Payer: Self-pay | Admitting: Cardiology

## 2024-02-25 ENCOUNTER — Ambulatory Visit: Payer: Self-pay | Admitting: Nurse Practitioner

## 2024-02-25 NOTE — Telephone Encounter (Signed)
 Pt called and would like someone to call her about her lab results that she had done on 9/3  Best number [phone]

## 2024-02-25 NOTE — Telephone Encounter (Signed)
 Labs look good.  White blood cell count is improving.  Continue follow-up with PCP.   Kelsey Hansen, AGNP-C Comprehensive Metabolic Panel (CMET); CBC    Patient informed and verbalized understanding of plan.

## 2024-02-25 NOTE — Telephone Encounter (Signed)
 Left patient a message that I will call her with results once received, advising we have results although they have not been reviewed by provider.
